# Patient Record
Sex: Male | Born: 1945 | Race: Black or African American | Hispanic: No | Smoking: Former smoker
Health system: Southern US, Community
[De-identification: ages and names within clinical notes are randomized; demographics above are authoritative.]

## PROBLEM LIST (undated history)

## (undated) DIAGNOSIS — I10 Essential (primary) hypertension: Secondary | ICD-10-CM

## (undated) DIAGNOSIS — E785 Hyperlipidemia, unspecified: Secondary | ICD-10-CM

## (undated) DIAGNOSIS — G4730 Sleep apnea, unspecified: Secondary | ICD-10-CM

## (undated) DIAGNOSIS — M1990 Unspecified osteoarthritis, unspecified site: Secondary | ICD-10-CM

## (undated) DIAGNOSIS — K219 Gastro-esophageal reflux disease without esophagitis: Secondary | ICD-10-CM

## (undated) DIAGNOSIS — I219 Acute myocardial infarction, unspecified: Secondary | ICD-10-CM

## (undated) DIAGNOSIS — R7303 Prediabetes: Secondary | ICD-10-CM

## (undated) DIAGNOSIS — Z9889 Other specified postprocedural states: Secondary | ICD-10-CM

## (undated) HISTORY — DX: Essential (primary) hypertension: I10

## (undated) HISTORY — PX: HERNIA REPAIR: SHX51

## (undated) HISTORY — DX: Prediabetes: R73.03

## (undated) HISTORY — DX: Hyperlipidemia, unspecified: E78.5

## (undated) HISTORY — DX: Unspecified osteoarthritis, unspecified site: M19.90

## (undated) HISTORY — PX: OTHER SURGICAL HISTORY: SHX169

---
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
## 2005-06-08 ENCOUNTER — Other Ambulatory Visit: Payer: Self-pay

## 2005-06-08 ENCOUNTER — Inpatient Hospital Stay: Payer: Self-pay | Admitting: Internal Medicine

## 2007-12-31 ENCOUNTER — Ambulatory Visit: Payer: Self-pay | Admitting: Internal Medicine

## 2008-02-02 ENCOUNTER — Ambulatory Visit: Payer: Self-pay | Admitting: Internal Medicine

## 2010-02-13 HISTORY — PX: COLONOSCOPY: SHX174

## 2010-11-01 ENCOUNTER — Ambulatory Visit: Payer: Self-pay | Admitting: Emergency Medicine

## 2012-06-12 ENCOUNTER — Emergency Department: Payer: Self-pay

## 2012-06-12 LAB — BASIC METABOLIC PANEL
Anion Gap: 5 — ABNORMAL LOW (ref 7–16)
BUN: 15 mg/dL (ref 7–18)
Co2: 27 mmol/L (ref 21–32)
Creatinine: 0.84 mg/dL (ref 0.60–1.30)
EGFR (African American): >60
EGFR (Non-African Amer.): >60
Glucose: 103 mg/dL — ABNORMAL HIGH (ref 65–99)
Osmolality: 280 (ref 275–301)
Potassium: 4 mmol/L (ref 3.5–5.1)

## 2012-06-12 LAB — CBC
MCH: 32.2 pg (ref 26.0–34.0)
RBC: 4.85 10*6/uL (ref 4.40–5.90)
RDW: 14 % (ref 11.5–14.5)

## 2012-06-12 LAB — CK TOTAL AND CKMB (NOT AT ARMC)
CK, Total: 142 U/L (ref 35–232)
CK-MB: 2.6 ng/mL (ref 0.5–3.6)

## 2013-07-22 DIAGNOSIS — I10 Essential (primary) hypertension: Secondary | ICD-10-CM | POA: Insufficient documentation

## 2013-07-22 DIAGNOSIS — E119 Type 2 diabetes mellitus without complications: Secondary | ICD-10-CM | POA: Insufficient documentation

## 2014-08-12 ENCOUNTER — Encounter: Payer: Self-pay | Admitting: Family Medicine

## 2014-08-12 ENCOUNTER — Ambulatory Visit (INDEPENDENT_AMBULATORY_CARE_PROVIDER_SITE_OTHER): Payer: Medicare Other | Admitting: Family Medicine

## 2014-08-12 VITALS — BP 109/66 | HR 76 | Temp 98.1°F | Ht 74.7 in | Wt 298.0 lb

## 2014-08-12 DIAGNOSIS — M2550 Pain in unspecified joint: Secondary | ICD-10-CM

## 2014-08-12 DIAGNOSIS — Z0000 Encounter for general adult medical examination without abnormal findings: Secondary | ICD-10-CM

## 2014-08-12 DIAGNOSIS — R7309 Other abnormal glucose: Secondary | ICD-10-CM | POA: Insufficient documentation

## 2014-08-12 DIAGNOSIS — N400 Enlarged prostate without lower urinary tract symptoms: Secondary | ICD-10-CM | POA: Diagnosis not present

## 2014-08-12 DIAGNOSIS — R7303 Prediabetes: Secondary | ICD-10-CM

## 2014-08-12 LAB — URINALYSIS, ROUTINE W REFLEX MICROSCOPIC
Bilirubin, UA: NEGATIVE
GLUCOSE, UA: NEGATIVE
Ketones, UA: NEGATIVE
Leukocytes, UA: NEGATIVE
Nitrite, UA: NEGATIVE
PROTEIN UA: NEGATIVE
RBC UA: NEGATIVE
Specific Gravity, UA: 1.02 (ref 1.005–1.030)
Urobilinogen, Ur: 0.2 mg/dL (ref 0.2–1.0)
pH, UA: 7 (ref 5.0–7.5)

## 2014-08-12 LAB — BAYER DCA HB A1C WAIVED: HB A1C: 6.3 % (ref <7.0)

## 2014-08-12 NOTE — Progress Notes (Signed)
BP 109/66 mmHg  Pulse 76  Temp(Src) 98.1 F (36.7 C)  Ht 6' 2.7" (1.897 m)  Wt 298 lb (135.172 kg)  BMI 37.56 kg/m2  SpO2 97%   Subjective:    [name], male    DOB: 1945-07-14, 69 y.o.   MRN: [id number]  HPI: Derek Wright is a 69 y.o. male  Chief Complaint  Patient presents with  . Annual Exam  has poly arthritis Ankles hips knees back shoulders Mostly ankles and hips occ hands Stiff sore BC helps a lot, aleve helps most Ongoing for years Some ankle swelling  Relevant past medical, surgical, family and social history reviewed and updated as indicated. Interim medical history since our last visit reviewed. Allergies and medications reviewed and updated.  Review of Systems  Constitutional: Negative.   HENT: Negative.   Eyes: Negative.   Respiratory: Negative.   Cardiovascular: Negative.   Endocrine: Negative.   Musculoskeletal: Positive for myalgias, back pain, joint swelling and arthralgias.  Skin: Negative.   Allergic/Immunologic: Negative.   Neurological: Negative.   Hematological: Negative.   Psychiatric/Behavioral: Negative.     Per HPI unless specifically indicated above     Objective:    BP 109/66 mmHg  Pulse 76  Temp(Src) 98.1 F (36.7 C)  Ht 6' 2.7" (1.897 m)  Wt 298 lb (135.172 kg)  BMI 37.56 kg/m2  SpO2 97%  Wt Readings from Last 3 Encounters:  08/12/14 298 lb (135.172 kg)  07/16/13 297 lb (134.718 kg)    Physical Exam  Constitutional: He is oriented to person, place, and time. He appears well-developed and well-nourished.  HENT:  Head: Normocephalic and atraumatic.  Right Ear: External ear normal.  Left Ear: External ear normal.  Eyes: Conjunctivae and EOM are normal. Pupils are equal, round, and reactive to light.  Neck: Normal range of motion. Neck supple.  Cardiovascular: Normal rate, regular rhythm, normal heart sounds and intact distal pulses.   Pulmonary/Chest: Effort normal and breath sounds normal.   Abdominal: Soft. Bowel sounds are normal. There is no splenomegaly or hepatomegaly.  Genitourinary: Rectum normal, prostate normal and penis normal.  Musculoskeletal: Normal range of motion. He exhibits no edema.       Right ankle: Tenderness.       Left ankle: Tenderness.  Neurological: He is alert and oriented to person, place, and time. He has normal reflexes.  Skin: No rash noted. No erythema.  Psychiatric: He has a normal mood and affect. His behavior is normal. Judgment and thought content normal.    Results for orders placed or performed in visit on 94/49/67  Basic metabolic panel  Result Value Ref Range   Glucose 103 (H) 65-99 mg/dL   BUN 15 7-18 mg/dL   Creatinine 0.84 0.60-1.30 mg/dL   Sodium 140 136-145 mmol/L   Potassium 4.0 3.5-5.1 mmol/L   Chloride 108 (H) 98-107 mmol/L   Co2 27 21-32 mmol/L   Calcium, Total 8.6 8.5-10.1 mg/dL   Osmolality 280 275-301   Anion Gap 5 (L) 7-16   EGFR (African American) >60    EGFR (Non-African Amer.) >60   CK total and CKMB (cardiac)  Result Value Ref Range   CK, Total 142 35-232 Unit/L   CK-MB 2.6 0.5-3.6 ng/mL  CBC  Result Value Ref Range   WBC 4.4 3.8-10.6 x10 3/mm 3   RBC 4.85 4.40-5.90 x10 6/mm 3   HGB 15.6 13.0-18.0 g/dL   HCT 45.2 40.0-52.0 %   MCV 93 80-100 fL  MCH 32.2 26.0-34.0 pg   MCHC 34.6 32.0-36.0 g/dL   RDW 14.0 11.5-14.5 %   Platelet 214 150-440 x10 3/mm 3  Troponin I  Result Value Ref Range   Troponin-I 0.02 ng/mL  Troponin I  Result Value Ref Range   Troponin-I < 0.02 ng/mL      Assessment & Plan:   Problem List Items Addressed This Visit      Endocrine   Pre-diabetes   Relevant Orders   Bayer DCA Hb A1c Waived   TSH   Lipid panel     Genitourinary   BPH without obstruction/lower urinary tract symptoms   Relevant Orders   TSH   PSA   Lipid panel     Other   Arthralgia - Primary    Discussed care and tx shoes etc      Relevant Orders   TSH    Other Visit Diagnoses    PE  (physical exam), annual        Relevant Orders    Comprehensive metabolic panel    CBC with Differential/Platelet    Urinalysis, Routine w reflex microscopic (not at [facility])    TSH    Lipid panel        Follow up plan: Return in about 1 year (around 08/12/2015) for Physical Exam and a1c.       

## 2014-08-12 NOTE — Assessment & Plan Note (Signed)
Discussed care and tx shoes etc

## 2014-08-13 LAB — COMPREHENSIVE METABOLIC PANEL
ALT: 15 IU/L (ref 0–44)
AST: 16 IU/L (ref 0–40)
Albumin/Globulin Ratio: 1.7 (ref 1.1–2.5)
Albumin: 4 g/dL (ref 3.6–4.8)
Alkaline Phosphatase: 56 IU/L (ref 39–117)
BILIRUBIN TOTAL: 0.4 mg/dL (ref 0.0–1.2)
BUN/Creatinine Ratio: 13 (ref 10–22)
BUN: 13 mg/dL (ref 8–27)
CHLORIDE: 101 mmol/L (ref 97–108)
CO2: 26 mmol/L (ref 18–29)
Calcium: 9.2 mg/dL (ref 8.6–10.2)
Creatinine, Ser: 0.99 mg/dL (ref 0.76–1.27)
GFR calc non Af Amer: 78 mL/min/{1.73_m2} (ref >59)
GFR, EST AFRICAN AMERICAN: 91 mL/min/{1.73_m2} (ref >59)
Globulin, Total: 2.4 g/dL (ref 1.5–4.5)
Glucose: 76 mg/dL (ref 65–99)
Potassium: 4.3 mmol/L (ref 3.5–5.2)
SODIUM: 140 mmol/L (ref 134–144)
Total Protein: 6.4 g/dL (ref 6.0–8.5)

## 2014-08-13 LAB — CBC WITH DIFFERENTIAL/PLATELET
Basophils Absolute: 0 10*3/uL (ref 0.0–0.2)
Basos: 0 %
EOS (ABSOLUTE): 0.1 10*3/uL (ref 0.0–0.4)
Eos: 3 %
Hematocrit: 43.6 % (ref 37.5–51.0)
Hemoglobin: 14.5 g/dL (ref 12.6–17.7)
IMMATURE GRANS (ABS): 0 10*3/uL (ref 0.0–0.1)
IMMATURE GRANULOCYTES: 0 %
Lymphocytes Absolute: 1.8 10*3/uL (ref 0.7–3.1)
Lymphs: 39 %
MCH: 31.3 pg (ref 26.6–33.0)
MCHC: 33.3 g/dL (ref 31.5–35.7)
MCV: 94 fL (ref 79–97)
MONOCYTES: 12 %
Monocytes Absolute: 0.6 10*3/uL (ref 0.1–0.9)
NEUTROS ABS: 2.1 10*3/uL (ref 1.4–7.0)
NEUTROS PCT: 46 %
Platelets: 214 10*3/uL (ref 150–379)
RBC: 4.63 x10E6/uL (ref 4.14–5.80)
RDW: 14.1 % (ref 12.3–15.4)
WBC: 4.7 10*3/uL (ref 3.4–10.8)

## 2014-08-13 LAB — LIPID PANEL
CHOL/HDL RATIO: 3.3 ratio (ref 0.0–5.0)
Cholesterol, Total: 171 mg/dL (ref 100–199)
HDL: 52 mg/dL (ref >39)
LDL Calculated: 87 mg/dL (ref 0–99)
Triglycerides: 161 mg/dL — ABNORMAL HIGH (ref 0–149)
VLDL Cholesterol Cal: 32 mg/dL (ref 5–40)

## 2014-08-13 LAB — TSH: TSH: 1.46 u[IU]/mL (ref 0.450–4.500)

## 2014-08-13 LAB — PSA: Prostate Specific Ag, Serum: 1.7 ng/mL (ref 0.0–4.0)

## 2015-01-21 ENCOUNTER — Encounter: Payer: Self-pay | Admitting: Family Medicine

## 2015-07-14 ENCOUNTER — Ambulatory Visit (INDEPENDENT_AMBULATORY_CARE_PROVIDER_SITE_OTHER): Payer: Medicare Other | Admitting: Family Medicine

## 2015-07-14 ENCOUNTER — Encounter: Payer: Self-pay | Admitting: Family Medicine

## 2015-07-14 VITALS — BP 117/76 | HR 83 | Temp 97.9°F | Ht 72.2 in | Wt 268.0 lb

## 2015-07-14 DIAGNOSIS — K4090 Unilateral inguinal hernia, without obstruction or gangrene, not specified as recurrent: Secondary | ICD-10-CM | POA: Insufficient documentation

## 2015-07-14 NOTE — Assessment & Plan Note (Signed)
Discussed care and tx will obs for now

## 2015-07-14 NOTE — Progress Notes (Signed)
BP 117/76 mmHg  Pulse 83  Temp(Src) 97.9 F (36.6 C)  Ht 6' 0.2" (1.834 m)  Wt 268 lb (121.564 kg)  BMI 36.14 kg/m2  SpO2 97%   Subjective:    [name], male    DOB: [birth year], 70 y.o.   MRN: [id number]  HPI: Derek Wright is a 70 y.o. male  Chief Complaint  Patient presents with  . Hernia  Patient with left groin area bulging concerned to hernia hasn't done any really heavy lifting but does a lot of walking movement and light lifting. No real pain in this area no blood in stool or urine no change in bowels or urinary habits  Relevant past medical, surgical, family and social history reviewed and updated as indicated. Interim medical history since our last visit reviewed. Allergies and medications reviewed and updated.  Review of Systems  Per HPI unless specifically indicated above     Objective:    BP 117/76 mmHg  Pulse 83  Temp(Src) 97.9 F (36.6 C)  Ht 6' 0.2" (1.834 m)  Wt 268 lb (121.564 kg)  BMI 36.14 kg/m2  SpO2 97%  Wt Readings from Last 3 Encounters:  07/14/15 268 lb (121.564 kg)  08/12/14 298 lb (135.172 kg)  07/16/13 297 lb (134.718 kg)    Physical Exam  Constitutional: He is oriented to person, place, and time. He appears well-developed and well-nourished. No distress.  HENT:  Head: Normocephalic and atraumatic.  Right Ear: Hearing normal.  Left Ear: Hearing normal.  Nose: Nose normal.  Eyes: Conjunctivae and lids are normal. Right eye exhibits no discharge. Left eye exhibits no discharge. No scleral icterus.  Pulmonary/Chest: Effort normal. No respiratory distress.  Genitourinary:  Left pelvic area hernia  Musculoskeletal: Normal range of motion.  Neurological: He is alert and oriented to person, place, and time.  Skin: Skin is intact. No rash noted.  Psychiatric: He has a normal mood and affect. His speech is normal and behavior is normal. Judgment and thought content normal. Cognition and memory are normal.    Results for  orders placed or performed in visit on 08/12/14  Bayer DCA Hb A1c Waived  Result Value Ref Range   Bayer DCA Hb A1c Waived 6.3 <7.0 %  Comprehensive metabolic panel  Result Value Ref Range   Glucose 76 65 - 99 mg/dL   BUN 13 8 - 27 mg/dL   Creatinine, Ser 0.99 0.76 - 1.27 mg/dL   GFR calc non Af Amer 78 >59 mL/min/1.73   GFR calc Af Amer 91 >59 mL/min/1.73   BUN/Creatinine Ratio 13 10 - 22   Sodium 140 134 - 144 mmol/L   Potassium 4.3 3.5 - 5.2 mmol/L   Chloride 101 97 - 108 mmol/L   CO2 26 18 - 29 mmol/L   Calcium 9.2 8.6 - 10.2 mg/dL   Total Protein 6.4 6.0 - 8.5 g/dL   Albumin 4.0 3.6 - 4.8 g/dL   Globulin, Total 2.4 1.5 - 4.5 g/dL   Albumin/Globulin Ratio 1.7 1.1 - 2.5   Bilirubin Total 0.4 0.0 - 1.2 mg/dL   Alkaline Phosphatase 56 39 - 117 IU/L   AST 16 0 - 40 IU/L   ALT 15 0 - 44 IU/L  CBC with Differential/Platelet  Result Value Ref Range   WBC 4.7 3.4 - 10.8 x10E3/uL   RBC 4.63 4.14 - 5.80 x10E6/uL   Hemoglobin 14.5 12.6 - 17.7 g/dL   Hematocrit 43.6 37.5 - 51.0 %  MCV 94 79 - 97 fL   MCH 31.3 26.6 - 33.0 pg   MCHC 33.3 31.5 - 35.7 g/dL   RDW 14.1 12.3 - 15.4 %   Platelets 214 150 - 379 x10E3/uL   Neutrophils 46 %   Lymphs 39 %   Monocytes 12 %   Eos 3 %   Basos 0 %   Neutrophils Absolute 2.1 1.4 - 7.0 x10E3/uL   Lymphocytes Absolute 1.8 0.7 - 3.1 x10E3/uL   Monocytes Absolute 0.6 0.1 - 0.9 x10E3/uL   EOS (ABSOLUTE) 0.1 0.0 - 0.4 x10E3/uL   Basophils Absolute 0.0 0.0 - 0.2 x10E3/uL   Immature Granulocytes 0 %   Immature Grans (Abs) 0.0 0.0 - 0.1 x10E3/uL  Urinalysis, Routine w reflex microscopic (not at [facility])  Result Value Ref Range   Specific Gravity, UA 1.020 1.005 - 1.030   pH, UA 7.0 5.0 - 7.5   Color, UA Yellow Yellow   Appearance Ur Clear Clear   Leukocytes, UA Negative Negative   Protein, UA Negative Negative/Trace   Glucose, UA Negative Negative   Ketones, UA Negative Negative   RBC, UA Negative Negative   Bilirubin, UA Negative Negative    Urobilinogen, Ur 0.2 0.2 - 1.0 mg/dL   Nitrite, UA Negative Negative  TSH  Result Value Ref Range   TSH 1.460 0.450 - 4.500 uIU/mL  PSA  Result Value Ref Range   Prostate Specific Ag, Serum 1.7 0.0 - 4.0 ng/mL  Lipid panel  Result Value Ref Range   Cholesterol, Total 171 100 - 199 mg/dL   Triglycerides 161 (H) 0 - 149 mg/dL   HDL 52 >39 mg/dL   VLDL Cholesterol Cal 32 5 - 40 mg/dL   LDL Calculated 87 0 - 99 mg/dL   Chol/HDL Ratio 3.3 0.0 - 5.0 ratio units      Assessment & Plan:   Problem List Items Addressed This Visit      Other   Hernia, inguinal, left - Primary    Discussed care and tx will obs for now          Follow up plan: Return for Physical Exam.

## 2015-08-13 ENCOUNTER — Telehealth: Payer: Self-pay | Admitting: Family Medicine

## 2015-08-13 ENCOUNTER — Other Ambulatory Visit: Payer: Self-pay | Admitting: Family Medicine

## 2015-08-13 DIAGNOSIS — K4090 Unilateral inguinal hernia, without obstruction or gangrene, not specified as recurrent: Secondary | ICD-10-CM

## 2015-08-13 NOTE — Telephone Encounter (Signed)
Pt came in stated he has a hernia would like surgery to fix this. Pt would like to be referred for this. Please call pt for further details. Thanks.

## 2015-08-13 NOTE — Telephone Encounter (Signed)
Referral placed for gen surgery

## 2015-08-13 NOTE — Telephone Encounter (Signed)
Patient was seen on 07/14/15 for this hernia, can you put in a referral please

## 2015-08-18 ENCOUNTER — Encounter: Payer: Self-pay | Admitting: General Surgery

## 2015-08-23 ENCOUNTER — Encounter: Payer: Medicare Other | Admitting: Family Medicine

## 2015-09-01 ENCOUNTER — Ambulatory Visit: Payer: Self-pay | Admitting: General Surgery

## 2015-09-13 ENCOUNTER — Ambulatory Visit (INDEPENDENT_AMBULATORY_CARE_PROVIDER_SITE_OTHER): Payer: Medicare Other | Admitting: Family Medicine

## 2015-09-13 ENCOUNTER — Ambulatory Visit (INDEPENDENT_AMBULATORY_CARE_PROVIDER_SITE_OTHER): Payer: Medicare Other | Admitting: General Surgery

## 2015-09-13 ENCOUNTER — Encounter: Payer: Self-pay | Admitting: General Surgery

## 2015-09-13 ENCOUNTER — Encounter: Payer: Self-pay | Admitting: Family Medicine

## 2015-09-13 VITALS — BP 130/72 | HR 68 | Resp 14 | Ht 77.0 in | Wt 278.0 lb

## 2015-09-13 VITALS — BP 104/65 | HR 68 | Temp 97.2°F | Ht 74.0 in | Wt 276.0 lb

## 2015-09-13 DIAGNOSIS — K4090 Unilateral inguinal hernia, without obstruction or gangrene, not specified as recurrent: Secondary | ICD-10-CM | POA: Diagnosis not present

## 2015-09-13 DIAGNOSIS — Z23 Encounter for immunization: Secondary | ICD-10-CM

## 2015-09-13 DIAGNOSIS — M2550 Pain in unspecified joint: Secondary | ICD-10-CM

## 2015-09-13 DIAGNOSIS — R609 Edema, unspecified: Secondary | ICD-10-CM | POA: Diagnosis not present

## 2015-09-13 DIAGNOSIS — M25473 Effusion, unspecified ankle: Secondary | ICD-10-CM | POA: Insufficient documentation

## 2015-09-13 DIAGNOSIS — R7303 Prediabetes: Secondary | ICD-10-CM | POA: Diagnosis not present

## 2015-09-13 DIAGNOSIS — N400 Enlarged prostate without lower urinary tract symptoms: Secondary | ICD-10-CM

## 2015-09-13 DIAGNOSIS — Z0000 Encounter for general adult medical examination without abnormal findings: Secondary | ICD-10-CM

## 2015-09-13 LAB — URINALYSIS, ROUTINE W REFLEX MICROSCOPIC
Bilirubin, UA: NEGATIVE
Glucose, UA: NEGATIVE
KETONES UA: NEGATIVE
LEUKOCYTES UA: NEGATIVE
Nitrite, UA: NEGATIVE
PROTEIN UA: NEGATIVE
RBC UA: NEGATIVE
Specific Gravity, UA: 1.02 (ref 1.005–1.030)
Urobilinogen, Ur: 1 mg/dL (ref 0.2–1.0)
pH, UA: 6.5 (ref 5.0–7.5)

## 2015-09-13 NOTE — Assessment & Plan Note (Signed)
For edema discussed elevation modest salt and compression and will observe

## 2015-09-13 NOTE — Assessment & Plan Note (Signed)
Bilateral knee stiffness but no real complaints or other issues

## 2015-09-13 NOTE — Assessment & Plan Note (Signed)
Stable without meds for now

## 2015-09-13 NOTE — Assessment & Plan Note (Signed)
Will check hemoglobin A1c 

## 2015-09-13 NOTE — Patient Instructions (Signed)
Pneumococcal Conjugate Vaccine (PCV13)   1. Why get vaccinated?  Vaccination can protect both children and adults from pneumococcal disease.  Pneumococcal disease is caused by bacteria that can spread from person to person through close contact. It can cause ear infections, and it can also lead to more serious infections of the:  · Lungs (pneumonia),  · Blood (bacteremia), and  · Covering of the brain and spinal cord (meningitis).  Pneumococcal pneumonia is most common among adults. Pneumococcal meningitis can cause deafness and brain damage, and it kills about 1 child in 10 who get it.  Anyone can get pneumococcal disease, but children under 2 years of age and adults 65 years and older, people with certain medical conditions, and cigarette smokers are at the highest risk.  Before there was a vaccine, the [place] saw:  · more than 700 cases of meningitis,  · about 13,000 blood infections,  · about 5 million ear infections, and  · about 200 deaths  in children under 5 each year from pneumococcal disease. Since vaccine became available, severe pneumococcal disease in these children has fallen by 88%.  About 18,000 older adults die of pneumococcal disease each year in the [place].  Treatment of pneumococcal infections with penicillin and other drugs is not as effective as it used to be, because some strains of the disease have become resistant to these drugs. This makes prevention of the disease, through vaccination, even more important.  2. PCV13 vaccine  Pneumococcal conjugate vaccine (called PCV13) protects against 13 types of pneumococcal bacteria.  PCV13 is routinely given to children at 2, 4, 6, and 12-15 months of age. It is also recommended for children and adults 2 to 64 years of age with certain health conditions, and for all adults 65 years of age and older. Your doctor can give you details.  3. Some people should not get this vaccine  Anyone who has ever had a life-threatening allergic reaction  to a dose of this vaccine, to an earlier pneumococcal vaccine called PCV7, or to any vaccine containing diphtheria toxoid (for example, DTaP), should not get PCV13.  Anyone with a severe allergy to any component of PCV13 should not get the vaccine. Tell your doctor if the person being vaccinated has any severe allergies.  If the person scheduled for vaccination is not feeling well, your healthcare provider might decide to reschedule the shot on another day.  4. Risks of a vaccine reaction  With any medicine, including vaccines, there is a chance of reactions. These are usually mild and go away on their own, but serious reactions are also possible.  Problems reported following PCV13 varied by age and dose in the series. The most common problems reported among children were:  · About half became drowsy after the shot, had a temporary loss of appetite, or had redness or tenderness where the shot was given.  · About 1 out of 3 had swelling where the shot was given.  · About 1 out of 3 had a mild fever, and about 1 in 20 had a fever over 102.2°F.  · Up to about 8 out of 10 became fussy or irritable.  Adults have reported pain, redness, and swelling where the shot was given; also mild fever, fatigue, headache, chills, or muscle pain.  Young children who get PCV13 along with inactivated flu vaccine at the same time may be at increased risk for seizures caused by fever. Ask your doctor for more information.  Problems that   could happen after any vaccine:  · People sometimes faint after a medical procedure, including vaccination. Sitting or lying down for about 15 minutes can help prevent fainting, and injuries caused by a fall. Tell your doctor if you feel dizzy, or have vision changes or ringing in the ears.  · Some older children and adults get severe pain in the shoulder and have difficulty moving the arm where a shot was given. This happens very rarely.  · Any medication can cause a severe allergic reaction. Such  reactions from a vaccine are very rare, estimated at about 1 in a million doses, and would happen within a few minutes to a few hours after the vaccination.  As with any medicine, there is a very small chance of a vaccine causing a serious injury or death.  The safety of vaccines is always being monitored. For more information, visit: www.cdc.gov/vaccinesafety/  5. What if there is a serious reaction?  What should I look for?  · Look for anything that concerns you, such as signs of a severe allergic reaction, very high fever, or unusual behavior.  Signs of a severe allergic reaction can include hives, swelling of the face and throat, difficulty breathing, a fast heartbeat, dizziness, and weakness-usually within a few minutes to a few hours after the vaccination.  What should I do?  · If you think it is a severe allergic reaction or other emergency that can't wait, call 9-1-1 or get the person to the nearest hospital. Otherwise, call your doctor.  Reactions should be reported to the Vaccine Adverse Event Reporting System (VAERS). Your doctor should file this report, or you can do it yourself through the VAERS web site at www.vaers.hhs.gov, or by calling 1-800-822-7967.  VAERS does not give medical advice.  6. The National Vaccine Injury Compensation Program  The National Vaccine Injury Compensation Program (VICP) is a federal program that was created to compensate people who may have been injured by certain vaccines.  Persons who believe they may have been injured by a vaccine can learn about the program and about filing a claim by calling 1-800-338-2382 or visiting the VICP website at www.hrsa.gov/vaccinecompensation. There is a time limit to file a claim for compensation.  7. How can I learn more?  · Ask your healthcare provider. He or she can give you the vaccine package insert or suggest other sources of information.  · Call your local or state health department.  · Contact the Centers for Disease Control and  Prevention (CDC):    Call 1-800-232-4636 (1-800-CDC-INFO) or    Visit CDC's website at www.cdc.gov/vaccines  Vaccine Information Statement  PCV13 Vaccine (12/18/2013)     This information is not intended to replace advice given to you by your health care provider. Make sure you discuss any questions you have with your health care provider.     Document Released: 11/27/2005 Document Revised: 02/20/2014 Document Reviewed: 12/25/2013  Elsevier Interactive Patient Education ©2016 [employer]

## 2015-09-13 NOTE — Progress Notes (Signed)
BP 104/65 (BP Location: Left Arm, Patient Position: Sitting, Cuff Size: Normal)   Pulse 68   Temp 97.2 F (36.2 C)   Ht _0  (1.88 m)   Wt 276 lb (125.2 kg)   SpO2 99%   BMI 35.44 kg/m    Subjective:    [name], male    DOB: 08-05-1945, 70 y.o.   MRN: [id number]  HPI: Derek Wright is a 70 y.o. 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 On no nocturia no PND no orthopnea   Relevant past medical, surgical, family and social history reviewed and updated as indicated. Interim medical history since our last visit reviewed. Allergies and medications reviewed and updated.  Review of Systems  Constitutional: Negative.   HENT: Negative.   Eyes: Negative.   Respiratory: Negative.   Cardiovascular: Negative.   Gastrointestinal: Negative.   Endocrine: Negative.   Genitourinary: Negative.   Musculoskeletal: Negative.   Skin: Negative.   Allergic/Immunologic: Negative.   Neurological: Negative.   Hematological: Negative.   Psychiatric/Behavioral: Negative.     Per HPI unless specifically indicated above     Objective:    BP 104/65 (BP Location: Left Arm, Patient Position: Sitting, Cuff Size: Normal)   Pulse 68   Temp 97.2 F (36.2 C)   Ht _1  (1.88 m)   Wt 276 lb (125.2 kg)   SpO2 99%   BMI 35.44 kg/m   Wt Readings from Last 3 Encounters:  09/13/15 276 lb (125.2 kg)  09/13/15 278 lb (126.1 kg)  07/14/15 268 lb (121.6 kg)    Physical Exam  Constitutional: He is oriented to person, place, and time. He appears well-developed and well-nourished.  HENT:  Head: Normocephalic and atraumatic.  Right Ear: External ear normal.    Left Ear: External ear normal.  Eyes: Conjunctivae and EOM are normal. Pupils are equal, round, and reactive to light.  Neck: Normal range of motion. Neck supple.  Cardiovascular: Normal rate, regular rhythm, normal heart sounds and intact distal pulses.   Pulmonary/Chest: Effort normal and breath sounds normal.  Abdominal: Soft. Bowel sounds are normal. There is no splenomegaly or hepatomegaly.  Genitourinary: Rectum normal and prostate normal.  Genitourinary Comments: Hernia checked by surgery  Musculoskeletal: Normal range of motion.  Neurological: He is alert and oriented to person, place, and time. He has normal reflexes.  Skin: No rash noted. No erythema.  Psychiatric: He has a normal mood and affect. His behavior is normal. Judgment and thought content normal.    Results for orders placed or performed in visit on 08/12/14  Bayer DCA Hb A1c Waived  Result Value Ref Range   Bayer DCA Hb A1c Waived 6.3 <7.0 %  Comprehensive metabolic panel  Result Value Ref Range   Glucose 76 65 - 99 mg/dL   BUN 13 8 - 27 mg/dL   Creatinine, Ser 0.99 0.76 - 1.27 mg/dL   GFR calc non Af Amer 78 >59 mL/min/1.73   GFR calc Af Amer 91 >59 mL/min/1.73   BUN/Creatinine Ratio 13 10 - 22   Sodium 140 134 - 144 mmol/L   Potassium 4.3  3.5 - 5.2 mmol/L   Chloride 101 97 - 108 mmol/L   CO2 26 18 - 29 mmol/L   Calcium 9.2 8.6 - 10.2 mg/dL   Total Protein 6.4 6.0 - 8.5 g/dL   Albumin 4.0 3.6 - 4.8 g/dL   Globulin, Total 2.4 1.5 - 4.5 g/dL   Albumin/Globulin Ratio 1.7 1.1 - 2.5   Bilirubin Total 0.4 0.0 - 1.2 mg/dL   Alkaline Phosphatase 56 39 - 117 IU/L   AST 16 0 - 40 IU/L   ALT 15 0 - 44 IU/L  CBC with Differential/Platelet  Result Value Ref Range   WBC 4.7 3.4 - 10.8 x10E3/uL   RBC 4.63 4.14 - 5.80 x10E6/uL   Hemoglobin 14.5 12.6 - 17.7 g/dL   Hematocrit 43.6 37.5 - 51.0 %   MCV 94 79 - 97 fL   MCH 31.3 26.6 - 33.0 pg   MCHC 33.3 31.5 - 35.7 g/dL   RDW 14.1 12.3 - 15.4 %   Platelets 214  150 - 379 x10E3/uL   Neutrophils 46 %   Lymphs 39 %   Monocytes 12 %   Eos 3 %   Basos 0 %   Neutrophils Absolute 2.1 1.4 - 7.0 x10E3/uL   Lymphocytes Absolute 1.8 0.7 - 3.1 x10E3/uL   Monocytes Absolute 0.6 0.1 - 0.9 x10E3/uL   EOS (ABSOLUTE) 0.1 0.0 - 0.4 x10E3/uL   Basophils Absolute 0.0 0.0 - 0.2 x10E3/uL   Immature Granulocytes 0 %   Immature Grans (Abs) 0.0 0.0 - 0.1 x10E3/uL  Urinalysis, Routine w reflex microscopic (not at [facility])  Result Value Ref Range   Specific Gravity, UA 1.020 1.005 - 1.030   pH, UA 7.0 5.0 - 7.5   Color, UA Yellow Yellow   Appearance Ur Clear Clear   Leukocytes, UA Negative Negative   Protein, UA Negative Negative/Trace   Glucose, UA Negative Negative   Ketones, UA Negative Negative   RBC, UA Negative Negative   Bilirubin, UA Negative Negative   Urobilinogen, Ur 0.2 0.2 - 1.0 mg/dL   Nitrite, UA Negative Negative  TSH  Result Value Ref Range   TSH 1.460 0.450 - 4.500 uIU/mL  PSA  Result Value Ref Range   Prostate Specific Ag, Serum 1.7 0.0 - 4.0 ng/mL  Lipid panel  Result Value Ref Range   Cholesterol, Total 171 100 - 199 mg/dL   Triglycerides 161 (H) 0 - 149 mg/dL   HDL 52 >39 mg/dL   VLDL Cholesterol Cal 32 5 - 40 mg/dL   LDL Calculated 87 0 - 99 mg/dL   Chol/HDL Ratio 3.3 0.0 - 5.0 ratio units      Assessment & Plan:   Problem List Items Addressed This Visit      Genitourinary   BPH without obstruction/lower urinary tract symptoms    Stable without meds for now        Other   Arthralgia    Bilateral knee stiffness but no real complaints or other issues      Pre-diabetes    Will check hemoglobin A1c      Relevant Orders   Hgb A1c w/o eAG   Ankle edema    For edema discussed elevation modest salt and compression and will observe       Other Visit Diagnoses    Annual physical exam    -  Primary   Relevant Orders   Urinalysis, Routine w reflex microscopic (not at [facility])   CBC with Differential/Platelet  Comprehensive metabolic panel   Lipid Panel w/o Chol/HDL Ratio   PSA   TSH   Healthcare maintenance       Relevant Orders   Hepatitis C Antibody   Need for pneumococcal vaccination       Relevant Orders   Pneumococcal conjugate vaccine 13-valent IM (Completed)       Follow up plan: Return in about 1 year (around 09/12/2016), or if symptoms worsen or fail to improve.

## 2015-09-13 NOTE — Patient Instructions (Addendum)

## 2015-09-13 NOTE — Progress Notes (Signed)
[name], male   DOB: [birth year], 70 y.o.   MRN: [id number]  Chief Complaint  Patient presents with  . Other    inguinal hernia    HPI Derek Wright is a 70 y.o. male here today for a evaluation of a left inguinal hernia. He states he noticed about three months ago. The area pops in and out, Some pain with with activities. Wife is present at visit.  The patient is not aware of any unusual activity that precipitated his awareness of the left groin bulge. He was not aware of any problem in the right groin.  No difficulty with urination, rare nocturia. New 2  No change in bowel habits. HPI  Past Medical History:  Diagnosis Date  . Arthritis   . Pre-diabetes     Past Surgical History:  Procedure Laterality Date  . COLONOSCOPY  0000000  . hair folicle removed    . SKIN GRAFT Right 1966   hand    Family History  Problem Relation Age of Onset  . Ovarian cancer Mother   . Heart disease Father   . Lymphoma Maternal Uncle     Social History Social History  Substance Use Topics  . Smoking status: Former Smoker    Quit date: 09/13/1980  . Smokeless tobacco: Never Used  . Alcohol use 0.0 oz/week     Comment: rare    No Known Allergies  No current outpatient prescriptions on file.   No current facility-administered medications for this visit.     Review of Systems Review of Systems  Constitutional: Negative.   Respiratory: Negative.   Cardiovascular: Negative.     Blood pressure 130/72, pulse 68, resp. rate 14, height 6\' 5"  (1.956 m), weight 278 lb (126.1 kg).  The patient's weight is down 20 pounds from June 2015, up 10 pounds from May 2017.  Physical Exam Physical Exam  Constitutional: He is oriented to person, place, and time. He appears well-developed and well-nourished.  Eyes: Conjunctivae are normal. No scleral icterus.  Neck: Neck supple.  Cardiovascular: Normal rate, regular rhythm and normal heart sounds.   Pulmonary/Chest: Effort normal and  breath sounds normal.  Abdominal: Soft. Normal appearance and bowel sounds are normal. There is no tenderness. A hernia is present. Hernia confirmed positive in the right inguinal area and confirmed positive in the left inguinal area.  Genitourinary:    Right testis shows no mass and no tenderness. Left testis shows no mass and no tenderness.  Lymphadenopathy:    He has no cervical adenopathy.  Neurological: He is alert and oriented to person, place, and time.  Skin: Skin is warm and dry.    Data Reviewed  PCP notes of today. Results for orders placed or performed in visit on 08/12/14  Bayer DCA Hb A1c Waived  Result Value Ref Range   Bayer DCA Hb A1c Waived 6.3 <7.0 %  Comprehensive metabolic panel  Result Value Ref Range   Glucose 76 65 - 99 mg/dL   BUN 13 8 - 27 mg/dL   Creatinine, Ser 0.99 0.76 - 1.27 mg/dL   GFR calc non Af Amer 78 >59 mL/min/1.73   GFR calc Af Amer 91 >59 mL/min/1.73   BUN/Creatinine Ratio 13 10 - 22   Sodium 140 134 - 144 mmol/L   Potassium 4.3 3.5 - 5.2 mmol/L   Chloride 101 97 - 108 mmol/L   CO2 26 18 - 29 mmol/L   Calcium 9.2 8.6 - 10.2 mg/dL  Total Protein 6.4 6.0 - 8.5 g/dL   Albumin 4.0 3.6 - 4.8 g/dL   Globulin, Total 2.4 1.5 - 4.5 g/dL   Albumin/Globulin Ratio 1.7 1.1 - 2.5   Bilirubin Total 0.4 0.0 - 1.2 mg/dL   Alkaline Phosphatase 56 39 - 117 IU/L   AST 16 0 - 40 IU/L   ALT 15 0 - 44 IU/L  CBC with Differential/Platelet  Result Value Ref Range   WBC 4.7 3.4 - 10.8 x10E3/uL   RBC 4.63 4.14 - 5.80 x10E6/uL   Hemoglobin 14.5 12.6 - 17.7 g/dL   Hematocrit 43.6 37.5 - 51.0 %   MCV 94 79 - 97 fL   MCH 31.3 26.6 - 33.0 pg   MCHC 33.3 31.5 - 35.7 g/dL   RDW 14.1 12.3 - 15.4 %   Platelets 214 150 - 379 x10E3/uL   Neutrophils 46 %   Lymphs 39 %   Monocytes 12 %   Eos 3 %   Basos 0 %   Neutrophils Absolute 2.1 1.4 - 7.0 x10E3/uL   Lymphocytes Absolute 1.8 0.7 - 3.1 x10E3/uL   Monocytes Absolute 0.6  0.1 - 0.9 x10E3/uL   EOS (ABSOLUTE) 0.1 0.0 - 0.4 x10E3/uL   Basophils Absolute 0.0 0.0 - 0.2 x10E3/uL   Immature Granulocytes 0 %   Immature Grans (Abs) 0.0 0.0 - 0.1 x10E3/uL  Urinalysis, Routine w reflex microscopic (not at [facility])  Result Value Ref Range   Specific Gravity, UA 1.020 1.005 - 1.030   pH, UA 7.0 5.0 - 7.5   Color, UA Yellow Yellow   Appearance Ur Clear Clear   Leukocytes, UA Negative Negative   Protein, UA Negative Negative/Trace   Glucose, UA Negative Negative   Ketones, UA Negative Negative   RBC, UA Negative Negative   Bilirubin, UA Negative Negative   Urobilinogen, Ur 0.2 0.2 - 1.0 mg/dL   Nitrite, UA Negative Negative  TSH  Result Value Ref Range   TSH 1.460 0.450 - 4.500 uIU/mL  PSA  Result Value Ref Range   Prostate Specific Ag, Serum 1.7 0.0 - 4.0 ng/mL  Lipid panel  Result Value Ref Range   Cholesterol, Total 171 100 - 199 mg/dL   Triglycerides 161 (H) 0 - 149 mg/dL   HDL 52 >39 mg/dL   VLDL Cholesterol Cal 32 5 - 40 mg/dL   LDL Calculated 87 0 - 99 mg/dL   Chol/HDL Ratio 3.3 0.0 - 5.0 ratio units        Assessment    Bilateral inguinal hernia.    Plan    The patient remains active and is noting some discomfort especially on the left side. Elective repair is reasonable. The role for prosthetic mesh was discussed.   Hernia precautions and incarceration were discussed with the patient. If they develop symptoms of an incarcerated hernia, they were encouraged to seek prompt medical attention.  I have recommended repair of the hernia using mesh on an outpatient basis in the near future. The risk of infection was reviewed. The role of prosthetic mesh to minimize the risk of recurrence was reviewed.  The patient is scheduled for surgery at [facility] on 09/29/15. He will pre admit by phone. The patient is aware of date and instructions.   This information has been scribed by Gaspar Cola CMA.   Derek Wright 09/14/2015,  5:52 AM

## 2015-09-14 ENCOUNTER — Encounter: Payer: Self-pay | Admitting: Family Medicine

## 2015-09-14 DIAGNOSIS — K469 Unspecified abdominal hernia without obstruction or gangrene: Secondary | ICD-10-CM | POA: Insufficient documentation

## 2015-09-14 DIAGNOSIS — K4090 Unilateral inguinal hernia, without obstruction or gangrene, not specified as recurrent: Secondary | ICD-10-CM | POA: Insufficient documentation

## 2015-09-14 DIAGNOSIS — K4020 Bilateral inguinal hernia, without obstruction or gangrene, not specified as recurrent: Secondary | ICD-10-CM | POA: Insufficient documentation

## 2015-09-14 LAB — COMPREHENSIVE METABOLIC PANEL
ALBUMIN: 4 g/dL (ref 3.6–4.8)
ALT: 11 IU/L (ref 0–44)
AST: 16 IU/L (ref 0–40)
Albumin/Globulin Ratio: 1.7 (ref 1.2–2.2)
Alkaline Phosphatase: 58 IU/L (ref 39–117)
BILIRUBIN TOTAL: 0.3 mg/dL (ref 0.0–1.2)
BUN / CREAT RATIO: 17 (ref 10–24)
BUN: 14 mg/dL (ref 8–27)
CHLORIDE: 102 mmol/L (ref 96–106)
CO2: 25 mmol/L (ref 18–29)
CREATININE: 0.83 mg/dL (ref 0.76–1.27)
Calcium: 9.1 mg/dL (ref 8.6–10.2)
GFR calc non Af Amer: 90 mL/min/{1.73_m2} (ref >59)
GFR, EST AFRICAN AMERICAN: 105 mL/min/{1.73_m2} (ref >59)
GLUCOSE: 98 mg/dL (ref 65–99)
Globulin, Total: 2.4 g/dL (ref 1.5–4.5)
Potassium: 4.3 mmol/L (ref 3.5–5.2)
Sodium: 140 mmol/L (ref 134–144)
TOTAL PROTEIN: 6.4 g/dL (ref 6.0–8.5)

## 2015-09-14 LAB — LIPID PANEL W/O CHOL/HDL RATIO
CHOLESTEROL TOTAL: 168 mg/dL (ref 100–199)
HDL: 55 mg/dL (ref >39)
LDL CALC: 85 mg/dL (ref 0–99)
Triglycerides: 142 mg/dL (ref 0–149)
VLDL CHOLESTEROL CAL: 28 mg/dL (ref 5–40)

## 2015-09-14 LAB — CBC WITH DIFFERENTIAL/PLATELET
BASOS ABS: 0 10*3/uL (ref 0.0–0.2)
Basos: 1 %
EOS (ABSOLUTE): 0.2 10*3/uL (ref 0.0–0.4)
Eos: 6 %
HEMOGLOBIN: 13.8 g/dL (ref 12.6–17.7)
Hematocrit: 42.5 % (ref 37.5–51.0)
IMMATURE GRANS (ABS): 0 10*3/uL (ref 0.0–0.1)
IMMATURE GRANULOCYTES: 0 %
LYMPHS: 52 %
Lymphocytes Absolute: 2 10*3/uL (ref 0.7–3.1)
MCH: 31.2 pg (ref 26.6–33.0)
MCHC: 32.5 g/dL (ref 31.5–35.7)
MCV: 96 fL (ref 79–97)
MONOCYTES: 13 %
Monocytes Absolute: 0.5 10*3/uL (ref 0.1–0.9)
NEUTROS PCT: 28 %
Neutrophils Absolute: 1.1 10*3/uL — ABNORMAL LOW (ref 1.4–7.0)
PLATELETS: 190 10*3/uL (ref 150–379)
RBC: 4.42 x10E6/uL (ref 4.14–5.80)
RDW: 14.2 % (ref 12.3–15.4)
WBC: 3.8 10*3/uL (ref 3.4–10.8)

## 2015-09-14 LAB — PSA: Prostate Specific Ag, Serum: 1.6 ng/mL (ref 0.0–4.0)

## 2015-09-14 LAB — TSH: TSH: 0.692 u[IU]/mL (ref 0.450–4.500)

## 2015-09-14 LAB — HEPATITIS C ANTIBODY: HEP C VIRUS AB: 0.1 {s_co_ratio} (ref 0.0–0.9)

## 2015-09-14 NOTE — H&P (Signed)
HPI Derek Wright is a 70 y.o. male here today for a evaluation of a left inguinal hernia. He states he noticed about three months ago. The area pops in and out, Some pain with with activities. Wife is present at visit.  The patient is not aware of any unusual activity that precipitated his awareness of the left groin bulge. He was not aware of any problem in the right groin.  No difficulty with urination, rare nocturia. New 2  No change in bowel habits. HPI      Past Medical History:  Diagnosis Date  . Arthritis   . Pre-diabetes          Past Surgical History:  Procedure Laterality Date  . COLONOSCOPY  0000000  . hair folicle removed    . SKIN GRAFT Right 1966   hand         Family History  Problem Relation Age of Onset  . Ovarian cancer Mother   . Heart disease Father   . Lymphoma Maternal Uncle     Social History        Social History  Substance Use Topics  . Smoking status: Former Smoker    Quit date: 09/13/1980  . Smokeless tobacco: Never Used  . Alcohol use 0.0 oz/week      Comment: rare    No Known Allergies  No current outpatient prescriptions on file.   No current facility-administered medications for this visit.     Review of Systems Review of Systems  Constitutional: Negative.   Respiratory: Negative.   Cardiovascular: Negative.     Blood pressure 130/72, pulse 68, resp. rate 14, height 6\' 5"  (1.956 m), weight 278 lb (126.1 kg).  The patient's weight is down 20 pounds from June 2015, up 10 pounds from May 2017.  Physical Exam Physical Exam  Constitutional: He is oriented to person, place, and time. He appears well-developed and well-nourished.  Eyes: Conjunctivae are normal. No scleral icterus.  Neck: Neck supple.  Cardiovascular: Normal rate, regular rhythm and normal heart sounds.   Pulmonary/Chest: Effort normal and breath sounds normal.  Abdominal: Soft. Normal appearance and bowel sounds are normal. There is  no tenderness. A hernia is present. Hernia confirmed positive in the right inguinal area and confirmed positive in the left inguinal area.  Genitourinary:    Right testis shows no mass and no tenderness. Left testis shows no mass and no tenderness.  Lymphadenopathy:    He has no cervical adenopathy.  Neurological: He is alert and oriented to person, place, and time.  Skin: Skin is warm and dry.    Data Reviewed  PCP notes of today.      Results for orders placed or performed in visit on 08/12/14  Bayer DCA Hb A1c Waived  Result Value Ref Range   Bayer DCA Hb A1c Waived 6.3 <7.0 %  Comprehensive metabolic panel  Result Value Ref Range   Glucose 76 65 - 99 mg/dL   BUN 13 8 - 27 mg/dL   Creatinine, Ser 0.99 0.76 - 1.27 mg/dL   GFR calc non Af Amer 78 >59 mL/min/1.73   GFR calc Af Amer 91 >59 mL/min/1.73   BUN/Creatinine Ratio 13 10 - 22   Sodium 140 134 - 144 mmol/L   Potassium 4.3 3.5 - 5.2 mmol/L   Chloride 101 97 - 108 mmol/L   CO2 26 18 - 29 mmol/L   Calcium 9.2 8.6 - 10.2 mg/dL   Total Protein 6.4 6.0 - 8.5  g/dL   Albumin 4.0 3.6 - 4.8 g/dL   Globulin, Total 2.4 1.5 - 4.5 g/dL   Albumin/Globulin Ratio 1.7 1.1 - 2.5   Bilirubin Total 0.4 0.0 - 1.2 mg/dL   Alkaline Phosphatase 56 39 - 117 IU/L   AST 16 0 - 40 IU/L   ALT 15 0 - 44 IU/L  CBC with Differential/Platelet  Result Value Ref Range   WBC 4.7 3.4 - 10.8 x10E3/uL   RBC 4.63 4.14 - 5.80 x10E6/uL   Hemoglobin 14.5 12.6 - 17.7 g/dL   Hematocrit 43.6 37.5 - 51.0 %   MCV 94 79 - 97 fL   MCH 31.3 26.6 - 33.0 pg   MCHC 33.3 31.5 - 35.7 g/dL   RDW 14.1 12.3 - 15.4 %   Platelets 214 150 - 379 x10E3/uL   Neutrophils 46 %   Lymphs 39 %   Monocytes 12 %   Eos 3 %   Basos 0 %   Neutrophils Absolute 2.1 1.4 - 7.0 x10E3/uL   Lymphocytes Absolute 1.8 0.7 - 3.1 x10E3/uL   Monocytes Absolute 0.6 0.1 - 0.9 x10E3/uL   EOS (ABSOLUTE) 0.1 0.0 - 0.4 x10E3/uL   Basophils Absolute 0.0  0.0 - 0.2 x10E3/uL   Immature Granulocytes 0 %   Immature Grans (Abs) 0.0 0.0 - 0.1 x10E3/uL  Urinalysis, Routine w reflex microscopic (not at [facility])  Result Value Ref Range   Specific Gravity, UA 1.020 1.005 - 1.030   pH, UA 7.0 5.0 - 7.5   Color, UA Yellow Yellow   Appearance Ur Clear Clear   Leukocytes, UA Negative Negative   Protein, UA Negative Negative/Trace   Glucose, UA Negative Negative   Ketones, UA Negative Negative   RBC, UA Negative Negative   Bilirubin, UA Negative Negative   Urobilinogen, Ur 0.2 0.2 - 1.0 mg/dL   Nitrite, UA Negative Negative  TSH  Result Value Ref Range   TSH 1.460 0.450 - 4.500 uIU/mL  PSA  Result Value Ref Range   Prostate Specific Ag, Serum 1.7 0.0 - 4.0 ng/mL  Lipid panel  Result Value Ref Range   Cholesterol, Total 171 100 - 199 mg/dL   Triglycerides 161 (H) 0 - 149 mg/dL   HDL 52 >39 mg/dL   VLDL Cholesterol Cal 32 5 - 40 mg/dL   LDL Calculated 87 0 - 99 mg/dL   Chol/HDL Ratio 3.3 0.0 - 5.0 ratio units      Assessment    Bilateral inguinal hernia.    Plan    The patient remains active and is noting some discomfort especially on the left side. Elective repair is reasonable. The role for prosthetic mesh was discussed.   Hernia precautions and incarceration were discussed with the patient. If they develop symptoms of an incarcerated hernia, they were encouraged to seek prompt medical attention.  I have recommended repair of the hernia using mesh on an outpatient basis in the near future. The risk of infection was reviewed. The role of prosthetic mesh to minimize the risk of recurrence was reviewed.  The patient is scheduled for surgery at [facility] on 09/29/15. He will pre admit by phone. The patient is aware of date and instructions.   This information has been scribed by Gaspar Cola CMA.   Robert Bellow 09/14/2015, 5:52 AM

## 2015-09-15 LAB — HGB A1C W/O EAG: HEMOGLOBIN A1C: 6.2 % — AB (ref 4.8–5.6)

## 2015-09-15 LAB — SPECIMEN STATUS REPORT

## 2015-09-21 ENCOUNTER — Encounter
Admission: RE | Admit: 2015-09-21 | Discharge: 2015-09-21 | Disposition: A | Discharge status: Home / Self Care | Payer: Medicare Other | Source: Ambulatory Visit | Attending: General Surgery | Admitting: General Surgery

## 2015-09-21 HISTORY — DX: Sleep apnea, unspecified: G47.30

## 2015-09-21 HISTORY — DX: Gastro-esophageal reflux disease without esophagitis: K21.9

## 2015-09-21 NOTE — Pre-Procedure Instructions (Signed)
Derek Gibbon, MD - 08/19/2015 10:30 AM EDT Formatting of this note may be different from the original. Established Patient Visit   Chief Complaint: Chief Complaint  Patient presents with  . Follow-up  1 YEAR  Date of Service: 08/19/2015 Date of Birth: 05-19-1945 PCP: Derek Maple, MD  History of Present Illness: Derek Wright is a 70 y.o.male patient who presents for preoperative clearance for upcoming hernia surgery. Patient has a history of obesity hypertension diabetes possible obstructive sleep apnea has complains of dyspnea on exertion leg edema found to have a murmur on exam now needs cardiac clearance for surgery. Patient was working part-time in [employer] he did a lot of walking noticed he had a hernia he is worried that he started with the work he was doing there walking and lifting. Patient was seen by his primary physician he lost about 20 pounds with his activity but he recently quit working he complains of leg edema and cramps as well as his recently diagnosed hernia now here for preoperative evaluation he had pretesting done August 02, 2015 but was referred to cardiology for cardiac clearance  Past Medical and Surgical History  Past Medical History Past Medical History:  Diagnosis Date  . Diabetes mellitus type 2, uncomplicated (CMS-HCC)  diet controlled  . Hypertension   Past Surgical History He has a past surgical history that includes skin draft (Right).   Medications and Allergies  Current Medications  No current outpatient prescriptions on file.   No current facility-administered medications for this visit.   Allergies: Review of patient's allergies indicates no known allergies.  Social and Family History  Social History reports that he has quit smoking. He has never used smokeless tobacco. Drug use questions deferred to the physician. He reports that he does not drink alcohol.  Family History Family History  Problem Relation Age of Onset  . No Known  Problems Mother  . Heart disease Father   Review of Systems   Review of Systems: The patient denies chest pain, shortness of breath, orthopnea, paroxysmal nocturnal dyspnea, pedal edema, palpitations, heart racing, presyncope, syncope. Review of 12 Systems is negative except as described above.  Physical Examination   Vitals:BP 120/78  Pulse 70  Ht 195.6 cm (6\' 5" )  Wt (!) 127.9 kg (282 lb)  SpO2 99%  BMI 33.44 kg/m2 Ht:195.6 cm (6\' 5" ) Wt:(!) 127.9 kg (282 lb) [id number] surface area is 2.64 meters squared. Body mass index is 33.44 kg/(m^2).  HEENT: Pupils equally reactive to light and accomodation  Neck: Supple without thyromegaly, carotid pulses 2+ Lungs: clear to auscultation bilaterally; no wheezes, rales, rhonchi Heart: Regular rate and rhythm. No gallops, 2/6 sem murmurs or rub Abdomen: soft nontender, nondistended, with normal bowel sounds Extremities: no cyanosis, clubbing, or edema Peripheral Pulses: 2+ in all extremities, 2+ femoral pulses bilaterally  Assessment   70 y.o. male with  1. Pre-operative clearance  2. SOB (shortness of breath)  3. Murmur  4. Mild obesity  5. OSA (obstructive sleep apnea)  6. Osteoarthritis, unspecified osteoarthritis type, unspecified site  7. Controlled type 2 diabetes mellitus without complication, unspecified long term insulin use status (CMS-HCC)   Plan  1 preoperative assessment recommend noninvasive cardiac evaluation prior to surgery 2 murmur edema preoperative clearance recommend echocardiogram for further evaluation 3 anginal equivalent shortness of breath preoperative clearance recommend exercise Myoview 4 hypertension reasonably controlled continue current medical therapy 5 diabetes type 2 uncomplicated continue medical therapy 6 obstructive sleep apnea possibly recommend sleep study CPAP  weight loss 7 continue over-the-counter medication to help with symptoms 8 have the patient follow-up in 1 year  Orders Placed This  Encounter  Procedures  . NM myocardial perfusion SPECT multiple (stress and rest)  . ECG stress test only  . Echocardiogram 2D complete   Return in about 1 year (around 08/18/2016).  Derek Kida, MD       Plan of Treatment - as of this encounter  Scheduled Tests Scheduled Tests  Name Priority Associated Diagnoses Order Schedule  Echocardiogram 2D complete Routine Pre-operative clearance

## 2015-09-21 NOTE — Patient Instructions (Signed)
  Your procedure is scheduled on: 09-29-15 Report to Same Day Surgery 2nd floor medical mall To find out your arrival time please call [phone] between 1PM - 3PM on  09-28-15  Remember: Instructions that are not followed completely may result in serious medical risk, up to and including death, or upon the discretion of your surgeon and anesthesiologist your surgery may need to be rescheduled.    _x___ 1. Do not eat food or drink liquids after midnight. No gum chewing or hard candies.     __x__ 2. No Alcohol for 24 hours before or after surgery.   __x__3. No Smoking for 24 prior to surgery.   ____  4. Bring all medications with you on the day of surgery if instructed.    __x__ 5. Notify your doctor if there is any change in your medical condition     (cold, fever, infections).     Do not wear jewelry, make-up, hairpins, clips or nail polish.  Do not wear lotions, powders, or perfumes. You may wear deodorant.  Do not shave 48 hours prior to surgery. Men may shave face and neck.  Do not bring valuables to the hospital.    [facility] is not responsible for any belongings or valuables.               Contacts, dentures or bridgework may not be worn into surgery.  Leave your suitcase in the car. After surgery it may be brought to your room.  For patients admitted to the hospital, discharge time is determined by your treatment team.   Patients discharged the day of surgery will not be allowed to drive home.    Please read over the following fact sheets that you were given:   [facility] Preparing for Surgery and or MRSA Information   ____ Take these medicines the morning of surgery with A SIP OF WATER:    1. NONE  2.  3.  4.  5.  6.  ____ Fleet Enema (as directed)   ____ Use CHG Soap or sage wipes as directed on instruction sheet   ____ Use inhalers on the day of surgery and bring to hospital day of surgery  ____ Stop metformin 2 days prior to surgery    ____ Take 1/2 of  usual insulin dose the night before surgery and none on the morning of   surgery.   ____ Stop aspirin or coumadin, or plavix  _x__ Stop Anti-inflammatories such as Advil, Aleve, Ibuprofen, Motrin, Naproxen,          Naprosyn, Goodies powders or aspirin products-STOP BC POWDERS AND [place] to take Tylenol.   ____ Stop supplements until after surgery.    ____ Bring C-Pap to the hospital.

## 2015-09-21 NOTE — Pre-Procedure Instructions (Signed)
CALLED OVER TO DR CALLWOODS OFFICE AFTER DOING PHONE INTERVIEW FOR SURGERY-PT HAS SEEN DR CALLWOOD FOR SURGERY CLEARANCE BACK IN August 19, 2015-DR CALLWOODS NOTE SAID THAT HE NEEDS A MYOVIEW TO BE CLEARED FOR HIS SURGERY ON THE 16TH OF AUGUST-PT STILL HAS NOT HAD MYOVIEW AND STATES THAT NO ONE HAS EVER CALLED HIM TO SET UP APPT FOR MYOVIEW-I TOLD RECEPTIONIST THAT PT WILL HAVE TO HAVE THIS DONE PRIOR TO SURGERY OR ANESTHESIA WILL CANEL SURGERY UNTIL THIS IS DONE-RECEPTIONIST SAID THAT SHE WILL RELAY THIS MESSAGE TO DR CALLWOODS NURSE

## 2015-09-29 ENCOUNTER — Ambulatory Visit: Payer: Medicare Other | Admitting: Certified Registered"

## 2015-09-29 ENCOUNTER — Ambulatory Visit
Admission: RE | Admit: 2015-09-29 | Discharge: 2015-09-29 | Disposition: A | Discharge status: Home / Self Care | Payer: Medicare Other | Source: Ambulatory Visit | Attending: General Surgery | Admitting: General Surgery

## 2015-09-29 ENCOUNTER — Encounter
Admission: RE | Disposition: A | Discharge status: Home / Self Care | Payer: Self-pay | Source: Ambulatory Visit | Attending: General Surgery

## 2015-09-29 DIAGNOSIS — Z9889 Other specified postprocedural states: Secondary | ICD-10-CM | POA: Insufficient documentation

## 2015-09-29 DIAGNOSIS — Z791 Long term (current) use of non-steroidal anti-inflammatories (NSAID): Secondary | ICD-10-CM | POA: Insufficient documentation

## 2015-09-29 DIAGNOSIS — M1990 Unspecified osteoarthritis, unspecified site: Secondary | ICD-10-CM | POA: Diagnosis not present

## 2015-09-29 DIAGNOSIS — R7303 Prediabetes: Secondary | ICD-10-CM | POA: Diagnosis not present

## 2015-09-29 DIAGNOSIS — G4730 Sleep apnea, unspecified: Secondary | ICD-10-CM | POA: Diagnosis not present

## 2015-09-29 DIAGNOSIS — Z87891 Personal history of nicotine dependence: Secondary | ICD-10-CM | POA: Insufficient documentation

## 2015-09-29 DIAGNOSIS — Z7982 Long term (current) use of aspirin: Secondary | ICD-10-CM | POA: Diagnosis not present

## 2015-09-29 DIAGNOSIS — K219 Gastro-esophageal reflux disease without esophagitis: Secondary | ICD-10-CM | POA: Insufficient documentation

## 2015-09-29 DIAGNOSIS — K4020 Bilateral inguinal hernia, without obstruction or gangrene, not specified as recurrent: Secondary | ICD-10-CM | POA: Insufficient documentation

## 2015-09-29 DIAGNOSIS — K4090 Unilateral inguinal hernia, without obstruction or gangrene, not specified as recurrent: Secondary | ICD-10-CM

## 2015-09-29 HISTORY — PX: INGUINAL HERNIA REPAIR: SHX194

## 2015-09-29 SURGERY — REPAIR, HERNIA, INGUINAL, BILATERAL, LAPAROSCOPIC
Anesthesia: General | Laterality: Bilateral

## 2015-09-29 MED ORDER — PROPOFOL 10 MG/ML IV BOLUS
INTRAVENOUS | Status: DC | PRN
  Administered 2015-09-29: 200 mg via INTRAVENOUS

## 2015-09-29 MED ORDER — ACETAMINOPHEN 10 MG/ML IV SOLN
INTRAVENOUS | Status: DC | PRN
  Administered 2015-09-29: 1000 mg via INTRAVENOUS

## 2015-09-29 MED ORDER — ONDANSETRON HCL 4 MG/2ML IJ SOLN: 4.0000 mg | Freq: Once | INTRAMUSCULAR | Status: DC | PRN

## 2015-09-29 MED ORDER — FENTANYL CITRATE (PF) 100 MCG/2ML IJ SOLN
25.0000 ug | INTRAMUSCULAR | Status: DC | PRN
  Administered 2015-09-29 (×4): 25 ug via INTRAVENOUS

## 2015-09-29 MED ORDER — LACTATED RINGERS IV SOLN
INTRAVENOUS | Status: DC
  Administered 2015-09-29 (×2): via INTRAVENOUS

## 2015-09-29 MED ORDER — DEXTROSE 5 % IV SOLN
3.0000 g | INTRAVENOUS | Status: AC
  Administered 2015-09-29: 3 g via INTRAVENOUS
  Filled 2015-09-29: qty 3000

## 2015-09-29 MED ORDER — FENTANYL CITRATE (PF) 100 MCG/2ML IJ SOLN: INTRAMUSCULAR | Status: DC | PRN

## 2015-09-29 MED ORDER — KETOROLAC TROMETHAMINE 30 MG/ML IJ SOLN
INTRAMUSCULAR | Status: DC | PRN
  Administered 2015-09-29: 30 mg via INTRAVENOUS

## 2015-09-29 MED ORDER — FAMOTIDINE 20 MG PO TABS
ORAL_TABLET | ORAL | Status: AC
  Administered 2015-09-29: 20 mg via ORAL
  Filled 2015-09-29: qty 1

## 2015-09-29 MED ORDER — SUGAMMADEX SODIUM 200 MG/2ML IV SOLN
INTRAVENOUS | Status: DC | PRN
  Administered 2015-09-29: 200 mg via INTRAVENOUS

## 2015-09-29 MED ORDER — ACETAMINOPHEN 10 MG/ML IV SOLN
INTRAVENOUS | Status: AC
  Filled 2015-09-29: qty 100

## 2015-09-29 MED ORDER — ONDANSETRON HCL 4 MG/2ML IJ SOLN
INTRAMUSCULAR | Status: DC | PRN
  Administered 2015-09-29: 4 mg via INTRAVENOUS

## 2015-09-29 MED ORDER — FENTANYL CITRATE (PF) 100 MCG/2ML IJ SOLN
INTRAMUSCULAR | Status: DC | PRN
  Administered 2015-09-29: 50 ug via INTRAVENOUS
  Administered 2015-09-29: 100 ug via INTRAVENOUS
  Administered 2015-09-29: 50 ug via INTRAVENOUS

## 2015-09-29 MED ORDER — HYDROCODONE-ACETAMINOPHEN 5-325 MG PO TABS
ORAL_TABLET | ORAL | Status: AC
  Administered 2015-09-29: 1 via ORAL
  Filled 2015-09-29: qty 1

## 2015-09-29 MED ORDER — FENTANYL CITRATE (PF) 100 MCG/2ML IJ SOLN
INTRAMUSCULAR | Status: AC
  Filled 2015-09-29: qty 2

## 2015-09-29 MED ORDER — HYDROCODONE-ACETAMINOPHEN 5-325 MG PO TABS: 1.0000 | ORAL_TABLET | ORAL | 0 refills | Status: DC | PRN

## 2015-09-29 MED ORDER — MIDAZOLAM HCL 2 MG/2ML IJ SOLN
INTRAMUSCULAR | Status: DC | PRN
  Administered 2015-09-29: 2 mg via INTRAVENOUS

## 2015-09-29 MED ORDER — LIDOCAINE HCL (CARDIAC) 20 MG/ML IV SOLN
INTRAVENOUS | Status: DC | PRN
  Administered 2015-09-29: 100 mg via INTRAVENOUS

## 2015-09-29 MED ORDER — FAMOTIDINE 20 MG PO TABS
20.0000 mg | ORAL_TABLET | Freq: Once | ORAL | Status: AC
  Administered 2015-09-29: 20 mg via ORAL

## 2015-09-29 MED ORDER — ROCURONIUM BROMIDE 100 MG/10ML IV SOLN
INTRAVENOUS | Status: DC | PRN
  Administered 2015-09-29 (×2): 10 mg via INTRAVENOUS
  Administered 2015-09-29: 30 mg via INTRAVENOUS
  Administered 2015-09-29: 50 mg via INTRAVENOUS

## 2015-09-29 MED ORDER — DEXAMETHASONE SODIUM PHOSPHATE 10 MG/ML IJ SOLN
INTRAMUSCULAR | Status: DC | PRN
  Administered 2015-09-29: 10 mg via INTRAVENOUS

## 2015-09-29 MED ORDER — PHENYLEPHRINE HCL 10 MG/ML IJ SOLN
INTRAMUSCULAR | Status: DC | PRN
  Administered 2015-09-29: 100 ug via INTRAVENOUS

## 2015-09-29 SURGICAL SUPPLY — 36 items
BLADE SURG 11 STRL SS SAFETY (MISCELLANEOUS) ×2 IMPLANT
CANNULA DILATOR 10 W/SLV (CANNULA) ×2 IMPLANT
CANNULA DILATOR 5 W/SLV (CANNULA) ×4 IMPLANT
CATH TRAY 16F METER LATEX (MISCELLANEOUS) ×2 IMPLANT
CHLORAPREP W/TINT 26ML (MISCELLANEOUS) ×2 IMPLANT
DEVICE SECURE STRAP 25 ABSORB (INSTRUMENTS) ×2 IMPLANT
DISSECT BALLN SPACEMKR OVL PDB (BALLOONS) ×2
DISSECTOR BALLN SPCMKR OVL PDB (BALLOONS) ×1 IMPLANT
DISSECTOR KITTNER STICK (MISCELLANEOUS) ×1 IMPLANT
DISSECTORS/KITTNER STICK (MISCELLANEOUS) ×2
DRESSING TELFA 4X3 1S ST N-ADH (GAUZE/BANDAGES/DRESSINGS) ×2 IMPLANT
DRSG TEGADERM 2-3/8X2-3/4 SM (GAUZE/BANDAGES/DRESSINGS) ×6 IMPLANT
ELECT REM PT RETURN 9FT ADLT (ELECTROSURGICAL) ×2
ELECTRODE REM PT RTRN 9FT ADLT (ELECTROSURGICAL) ×1 IMPLANT
GLOVE BIO SURGEON STRL SZ7.5 (GLOVE) ×2 IMPLANT
GLOVE INDICATOR 8.0 STRL GRN (GLOVE) ×2 IMPLANT
GOWN STRL REUS W/ TWL LRG LVL3 (GOWN DISPOSABLE) ×2 IMPLANT
GOWN STRL REUS W/TWL LRG LVL3 (GOWN DISPOSABLE) ×2
KIT RM TURNOVER STRD PROC AR (KITS) ×2 IMPLANT
LABEL OR SOLS (LABEL) ×2 IMPLANT
MESH 3DMAX 3X5 LT MED (Mesh General) ×2 IMPLANT
MESH 3DMAX 3X5 RT MED (Mesh General) ×2 IMPLANT
NS IRRIG 500ML POUR BTL (IV SOLUTION) ×2 IMPLANT
PACK LAP CHOLECYSTECTOMY (MISCELLANEOUS) ×2 IMPLANT
SCISSORS METZENBAUM CVD 33 (INSTRUMENTS) ×2 IMPLANT
STRIP CLOSURE SKIN 1/2X4 (GAUZE/BANDAGES/DRESSINGS) ×2 IMPLANT
SUT MAXON ABS #0 GS21 30IN (SUTURE) ×2 IMPLANT
SUT VIC AB 0 SH 27 (SUTURE) ×2 IMPLANT
SUT VIC AB 4-0 FS2 27 (SUTURE) ×2 IMPLANT
SUT VICRYL 0 TIES 12 18 (SUTURE) ×2 IMPLANT
SWABSTK COMLB BENZOIN TINCTURE (MISCELLANEOUS) ×2 IMPLANT
TACKER 5MM HERNIA 3.5CML NAB (ENDOMECHANICALS) ×4 IMPLANT
TROCAR BALLN 10M OMST10SB SPAC (TROCAR) ×2 IMPLANT
TROCAR XCEL 12X100 BLDLESS (ENDOMECHANICALS) IMPLANT
TUBING INSUFFLATOR HI FLOW (MISCELLANEOUS) ×2 IMPLANT
WATER STERILE IRR 1000ML POUR (IV SOLUTION) ×2 IMPLANT

## 2015-09-29 NOTE — Transfer of Care (Signed)
Immediate Anesthesia Transfer of Care Note  Patient: Derek Wright  Procedure(s) Performed: Procedure(s): LAPAROSCOPIC BILATERAL INGUINAL HERNIA REPAIR (Bilateral)  Patient Location: PACU  Anesthesia Type:General  Level of Consciousness: awake, alert , oriented and patient cooperative  Airway & Oxygen Therapy: Patient Spontanous Breathing and Patient connected to face mask oxygen  Post-op Assessment: Report given to RN, Post -op Vital signs reviewed and stable and Patient moving all extremities X 4  Post vital signs: Reviewed and stable  Last Vitals:  Vitals:   09/29/15 1117  BP: 138/89  Pulse: 71  Resp: 18  Temp: 36.7 C    Last Pain:  Vitals:   09/29/15 1117  TempSrc: Oral  PainSc: 0-No pain         Complications: No apparent anesthesia complications

## 2015-09-29 NOTE — Anesthesia Procedure Notes (Signed)
Procedure Name: Intubation Date/Time: 09/29/2015 12:53 PM Performed by: Silvana Newness Pre-anesthesia Checklist: Patient identified, Emergency Drugs available, Suction available, Patient being monitored and Timeout performed Patient Re-evaluated:Patient Re-evaluated prior to inductionOxygen Delivery Method: Circle system utilized Preoxygenation: Pre-oxygenation with 100% oxygen Intubation Type: IV induction Ventilation: Mask ventilation without difficulty Laryngoscope Size: Mac and 4 Grade View: Grade II Tube type: Oral Tube size: 7.5 mm Number of attempts: 1 Airway Equipment and Method: Rigid stylet Placement Confirmation: ETT inserted through vocal cords under direct vision,  positive ETCO2 and breath sounds checked- equal and bilateral Secured at: 20 cm Tube secured with: Tape Dental Injury: Teeth and Oropharynx as per pre-operative assessment

## 2015-09-29 NOTE — Progress Notes (Signed)
Ice packs to right and left lower abdomen

## 2015-09-29 NOTE — H&P (Signed)
CHRISTOPHERMICH SENDEJO [id number] December 02, 1945     HPI: Healthy male with bilateral inguinal hernias for repair. Reports he passes his stress test with Dr. Clayborn Bigness last Thursday.    Prescriptions Prior to Admission  Medication Sig Dispense Refill Last Dose  . Aspirin-Salicylamide-Caffeine (BC HEADACHE PO) Take 1 packet by mouth daily as needed.   09/22/2015  . Menthol, Topical Analgesic, (ICY HOT EX) Apply 1 application topically daily as needed. For pain     . naproxen sodium (ALEVE) 220 MG tablet Take 220 mg by mouth as needed.   09/15/2015   No Known Allergies Past Medical History:  Diagnosis Date  . Arthritis   . GERD (gastroesophageal reflux disease)    RARE-NO MEDS  . Pre-diabetes   . Sleep apnea    MILD-NO CPAP-COULD NOT TOELERATE   Past Surgical History:  Procedure Laterality Date  . COLONOSCOPY  0000000  . hair folicle removed    . SKIN GRAFT Right 1966   hand   Social History   Social History  . Marital status: Married    Spouse name: N/A  . Number of children: N/A  . Years of education: N/A   Occupational History  . Not on file.   Social History Main Topics  . Smoking status: Former Smoker    Packs/day: 0.25    Years: 15.00    Types: Cigarettes    Quit date: 09/13/1980  . Smokeless tobacco: Never Used  . Alcohol use 0.0 oz/week     Comment: rare  . Drug use: No  . Sexual activity: Not on file   Other Topics Concern  . Not on file   Social History Narrative  . No narrative on file   Social History   Social History Narrative  . No narrative on file     ROS: Negative.     PE: HEENT: Negative. Lungs: Clear. Cardio: RR.  Assessment/Plan:  Proceed with planned inguinal hernia repair.   Robert Bellow 09/29/2015

## 2015-09-29 NOTE — Discharge Instructions (Signed)
AMBULATORY SURGERY  DISCHARGE INSTRUCTIONS   1) The drugs that you were given will stay in your system until tomorrow so for the next 24 hours you should not:  A) Drive an automobile B) Make any legal decisions C) Drink any alcoholic beverage   2) You may resume regular meals tomorrow.  Today it is better to start with liquids and gradually work up to solid foods.  You may eat anything you prefer, but it is better to start with liquids, then soup and crackers, and gradually work up to solid foods.   3) Please notify your doctor immediately if you have any unusual bleeding, trouble breathing, redness and pain at the surgery site, drainage, fever, or pain not relieved by medication.    4) Additional Instructions:        Please contact your physician with any problems or Same Day Surgery at [phone], Monday through Friday 6 am to 4 pm, or [place] at [facility] number at [phone].Inguinal Hernia, Adult , Care After Refer to this sheet in the next few weeks. These discharge instructions provide you with general information on caring for yourself after you leave the hospital. Your caregiver may also give you specific instructions. Your treatment has been planned according to the most current medical practices available, but unavoidable complications sometimes occur. If you have any problems or questions after discharge, please call your caregiver. HOME CARE INSTRUCTIONS  Put ice on the operative site.  Put ice in a plastic bag.  Place a towel between your skin and the bag.  Leave the ice on for 15-20 minutes at a time, 03-04 times a day while awake.  Change bandages (dressings) as directed.  Keep the wound dry and clean. The wound may be washed gently with soap and water. Gently blot or dab the wound dry. It is okay to take showers 24 to 48 hours after surgery. Do not take baths, use swimming pools, or use hot tubs for 10 days, or as directed by your  caregiver.  Only take over-the-counter or prescription medicines for pain, discomfort, or fever as directed by your caregiver.  Continue your normal diet as directed.  Do not lift anything more than 10 pounds or play contact sports for 3 weeks, or as directed. SEEK MEDICAL CARE IF:  There is redness, swelling, or increasing pain in the wound.  There is fluid (pus) coming from the wound.  There is drainage from a wound lasting longer than 1 day.  You have an oral temperature above 102 F (38.9 C).  You notice a bad smell coming from the wound or dressing.  The wound breaks open after the stitches (sutures) have been removed.  You notice increasing pain in the shoulders (shoulder strap areas).  You develop dizzy episodes or fainting while standing.  You feel sick to your stomach (nauseous) or throw up (vomit). SEEK IMMEDIATE MEDICAL CARE IF:  You develop a rash.  You have difficulty breathing.  You develop a reaction or have side effects to medicines you were given. MAKE SURE YOU:   Understand these instructions.  Will watch your condition.  Will get help right away if you are not doing well or get worse.   This information is not intended to replace advice given to you by your health care provider. Make sure you discuss any questions you have with your health care provider.   Document Released: 03/02/2006 Document Revised: 02/20/2014 Document Reviewed: 08/03/2014 Elsevier Interactive Patient Education [employer].

## 2015-09-29 NOTE — Anesthesia Preprocedure Evaluation (Signed)
Anesthesia Evaluation  Patient identified by MRN, date of birth, ID band Patient awake    Reviewed: Allergy & Precautions, H&P , NPO status , Patient's Chart, lab work & pertinent test results, reviewed documented beta blocker date and time   Airway Mallampati: III  TM Distance: >3 FB Neck ROM: full    Dental  (+) Poor Dentition   Pulmonary neg pulmonary ROS, sleep apnea , former smoker,    Pulmonary exam normal        Cardiovascular negative cardio ROS Normal cardiovascular exam Rhythm:regular Rate:Normal     Neuro/Psych negative neurological ROS  negative psych ROS   GI/Hepatic negative GI ROS, Neg liver ROS, GERD  Medicated,  Endo/Other  negative endocrine ROS  Renal/GU negative Renal ROS  negative genitourinary   Musculoskeletal   Abdominal   Peds  Hematology negative hematology ROS (+)   Anesthesia Other Findings Past Medical History: No date: Arthritis No date: GERD (gastroesophageal reflux disease)     Comment: RARE-NO MEDS No date: Pre-diabetes No date: Sleep apnea     Comment: MILD-NO CPAP-COULD NOT TOELERATE Past Surgical History: 2012: COLONOSCOPY No date: hair folicle removed [id number]: SKIN GRAFT Right     Comment: hand   Reproductive/Obstetrics negative OB ROS                             Anesthesia Physical Anesthesia Plan  ASA: III  Anesthesia Plan: General   Post-op Pain Management:    Induction:   Airway Management Planned:   Additional Equipment:   Intra-op Plan:   Post-operative Plan:   Informed Consent: I have reviewed the patients History and Physical, chart, labs and discussed the procedure including the risks, benefits and alternatives for the proposed anesthesia with the patient or authorized representative who has indicated his/her understanding and acceptance.   Dental Advisory Given  Plan Discussed with: CRNA  Anesthesia Plan Comments:          Anesthesia Quick Evaluation

## 2015-09-29 NOTE — Op Note (Signed)
Preoperative diagnosis: Bilateral inguinal hernias.  Postoperative diagnosis: Same, right direct, left indirect.  Operative procedure: Bilateral laparoscopic inguinal hernia repair with Bard 3-D mesh.  Operating surgeon: Ollen Bowl, M.D.  Anesthesia: Gen. endotracheal.  Estimated blood loss: 5 mL.  Clinical note: This 70 year old male presented with complaints of a left inguinal hernia. Clinical examination showed evidence of a right inguinal hernia as well. He was felt to be a candidate for bilateral inguinal hernia repair. The patient received 3 g intravenously prior to the procedure based on weight. SCD stockings for DVT prevention. Hair was removed with clippers prior to presentation of the operating theater.  Operative note: With the patient under adequate general endotracheal anesthesia is the arms were tucked at the side and supported by arm boards based on his width. In Trendelenburg position a varies needle was placed through trans-umbilical incision after lensing the skin with ChloraPrep. After assuring intra-abdominal location with the hanging drop test the abdomen was insufflated with CO2. A 10 mm Step port was expanded and inspection showed no evidence of injury from the initial port placement.  2-5 mm Step ports were then placed under direct vision laterally at the level of the umbilicus and just outside the rectus fascia. Inspection showed evidence of a direct defect on the right and indirect defect on the left. The right side was approached first. The peritoneum about 3 cm above the defect was incised and extended firstly to allow exposure of the posterior wall inguinal canal. The hernia sac was dissected free and the pubic tubercle and Cooper's ligament exposed. A medium Bard 3-D mesh, right, was then brought into the field to the 10 mm umbilical port. This was anchored to the pubic tubercle with a secure strap tach. Several tacks were then placed along Cooper's ligament and  then tacks placed along the upper abdominal wall avoiding the inferior epigastric vessels. The peritoneum was used to cover the mesh. Good hemostasis was noted.  Attention was turned to the left side were a direct defect was noted. The peritoneum 3 cm above the internal inguinal ring was incised as noted on the contralateral side. The preperitoneal space was opened. A generous sac was dissected free of the cord structures. Medium Bard 3-D mesh, left was then placed and again anchored to the pubic tubercle with a secure strap tach. Several tacks were placed along Cooper's ligament. The mesh was smoothed laterally and anchored superiorly avoiding the inferior epigastric vessels. Peritoneum was closed as noted above for the right side.  The fascial defect at the umbilical site was closed with a 0 Maxon suture making use of a "cold" suture passer. A single suture was utilized. The abdomen was desufflated under direct vision. Good hemostasis was noted. Skin incisions were closed with 4-0 Vicryl septic sutures. Benzoin, Steri-Strips, Telfa and Tegaderm dressings were applied.  The patient tolerated the procedure well and was taken to the recovery room in stable condition.

## 2015-09-29 NOTE — Progress Notes (Signed)
Pain level 3

## 2015-09-30 ENCOUNTER — Encounter: Payer: Self-pay | Admitting: General Surgery

## 2015-10-01 NOTE — Anesthesia Postprocedure Evaluation (Signed)
Anesthesia Post Note  Patient: Derek Wright  Procedure(s) Performed: Procedure(s) (LRB): LAPAROSCOPIC BILATERAL INGUINAL HERNIA REPAIR (Bilateral)  Patient location during evaluation: PACU Anesthesia Type: General Level of consciousness: awake and alert Pain management: pain level controlled Vital Signs Assessment: post-procedure vital signs reviewed and stable Respiratory status: spontaneous breathing, nonlabored ventilation, respiratory function stable and patient connected to nasal cannula oxygen Cardiovascular status: blood pressure returned to baseline and stable Postop Assessment: no signs of nausea or vomiting Anesthetic complications: no    Last Vitals:  Vitals:   09/29/15 1617 09/29/15 1700  BP: (!) 147/77 125/66  Pulse: 81 79  Resp: 16   Temp: 36.2 C     Last Pain:  Vitals:   09/29/15 1617  TempSrc: Tympanic  PainSc: 4                  Molli Barrows

## 2015-10-08 ENCOUNTER — Encounter: Payer: Self-pay | Admitting: General Surgery

## 2015-10-08 ENCOUNTER — Telehealth: Payer: Self-pay | Admitting: General Surgery

## 2015-10-08 NOTE — Telephone Encounter (Signed)
PT CALLED IN STATED HE HAD RECENTLY HAD HERNIA SX BY DR BYRNETT.HE WANTED TO KNOW IF HE COULD USE A WEED TRIMMER? I ASK DR BYRNETT &  HE STATES THE PT COULD USE ONE. THE INFORMATION WAS RELAYED TO PT./MTH

## 2015-10-11 ENCOUNTER — Encounter: Payer: Self-pay | Admitting: General Surgery

## 2015-10-11 ENCOUNTER — Ambulatory Visit (INDEPENDENT_AMBULATORY_CARE_PROVIDER_SITE_OTHER): Payer: Medicare Other | Admitting: General Surgery

## 2015-10-11 VITALS — BP 134/72 | HR 76 | Resp 14 | Ht 77.0 in | Wt 278.0 lb

## 2015-10-11 DIAGNOSIS — K4090 Unilateral inguinal hernia, without obstruction or gangrene, not specified as recurrent: Secondary | ICD-10-CM

## 2015-10-11 NOTE — Patient Instructions (Signed)
Plan to follow up in 6 weeks. Increase activity as needed. Use motrin as needed.

## 2015-10-11 NOTE — Progress Notes (Signed)
[name], male   DOB: [birth year], 70 y.o.   MRN: [id number]  Chief Complaint  Patient presents with  . Routine Post Op    bilateral inguinal hernia     HPI Derek Wright is a 70 y.o. male here today for a post op bilateral inguinal hernias done on 09/29/15. Patient states he is doing well. Taking pain medication as needed. The patient has a few of the hydrocodone left, but is eating these less and less. He remains active at home. Denies any urinary or bowel difficulties.  HPI  Past Medical History:  Diagnosis Date  . Arthritis   . GERD (gastroesophageal reflux disease)    RARE-NO MEDS  . Pre-diabetes   . Sleep apnea    MILD-NO CPAP-COULD NOT TOELERATE    Past Surgical History:  Procedure Laterality Date  . COLONOSCOPY  0000000  . hair folicle removed    . INGUINAL HERNIA REPAIR Bilateral 09/29/2015   Procedure: LAPAROSCOPIC BILATERAL INGUINAL HERNIA REPAIR;  Surgeon: Robert Bellow, MD;  Location: [facility] ORS;  Service: General;  Laterality: Bilateral;  . SKIN GRAFT Right 1966   hand    Family History  Problem Relation Age of Onset  . Ovarian cancer Mother   . Heart disease Father   . Lymphoma Maternal Uncle     Social History Social History  Substance Use Topics  . Smoking status: Former Smoker    Packs/day: 0.25    Years: 15.00    Types: Cigarettes    Quit date: 09/13/1980  . Smokeless tobacco: Never Used  . Alcohol use 0.0 oz/week     Comment: rare    No Known Allergies  Current Outpatient Prescriptions  Medication Sig Dispense Refill  . HYDROcodone-acetaminophen (NORCO) 5-325 MG tablet Take 1-2 tablets by mouth every 4 (four) hours as needed for moderate pain. 30 tablet 0  . Menthol, Topical Analgesic, (ICY HOT EX) Apply 1 application topically daily as needed. For pain    . naproxen sodium (ALEVE) 220 MG tablet Take 220 mg by mouth as needed.     No current facility-administered medications for this visit.     Review of Systems Review of  Systems  Constitutional: Negative.   Respiratory: Negative.   Cardiovascular: Negative.     Blood pressure 134/72, pulse 76, resp. rate 14, height 6\' 5"  (1.956 m), weight 278 lb (126.1 kg).  Physical Exam Physical Exam  Constitutional: He is oriented to person, place, and time. He appears well-developed and well-nourished.  Abdominal:    Well healed incisions.   Neurological: He is alert and oriented to person, place, and time.  Skin: Skin is warm and dry.    Data Reviewed Bilateral laparoscopic inguinal hernia repair with Bard 3-D mesh.    Assessment     Doing well status post inguinal hernia repair.    Plan  Proper lifting techniques reviewed. Patient may resume driving when he is comfortable.    Plan to follow up in 6 weeks. Increase activity as needed. Proper lifting techniques reviewed.         Robert Bellow 10/11/2015, 8:18 PM

## 2015-11-23 ENCOUNTER — Ambulatory Visit (INDEPENDENT_AMBULATORY_CARE_PROVIDER_SITE_OTHER): Payer: Medicare Other | Admitting: General Surgery

## 2015-11-23 ENCOUNTER — Encounter: Payer: Self-pay | Admitting: General Surgery

## 2015-11-23 VITALS — BP 130/84 | HR 72 | Resp 14 | Ht 77.0 in | Wt 284.0 lb

## 2015-11-23 DIAGNOSIS — K4090 Unilateral inguinal hernia, without obstruction or gangrene, not specified as recurrent: Secondary | ICD-10-CM

## 2015-11-23 NOTE — Progress Notes (Deleted)
[name], male   DOB: 07/15/1945, 70 y.o.   MRN: [id number]  Chief Complaint  Patient presents with  . Routine Post Op    HPI Derek Wright is a 70 y.o. male here today for a post op bilateral inguinal hernias done on 09/29/15. Patient states he is doing well.  HPI  Past Medical History:  Diagnosis Date  . Arthritis   . GERD (gastroesophageal reflux disease)    RARE-NO MEDS  . Pre-diabetes   . Sleep apnea    MILD-NO CPAP-COULD NOT TOELERATE    Past Surgical History:  Procedure Laterality Date  . COLONOSCOPY  0000000  . hair folicle removed    . INGUINAL HERNIA REPAIR Bilateral 09/29/2015   Procedure: LAPAROSCOPIC BILATERAL INGUINAL HERNIA REPAIR;  Surgeon: Robert Bellow, MD;  Location: [facility] ORS;  Service: General;  Laterality: Bilateral;  . SKIN GRAFT Right 1966   hand    Family History  Problem Relation Age of Onset  . Ovarian cancer Mother   . Heart disease Father   . Lymphoma Maternal Uncle     Social History Social History  Substance Use Topics  . Smoking status: Former Smoker    Packs/day: 0.25    Years: 15.00    Types: Cigarettes    Quit date: 09/13/1980  . Smokeless tobacco: Never Used  . Alcohol use 0.0 oz/week     Comment: rare    No Known Allergies  Current Outpatient Prescriptions  Medication Sig Dispense Refill  . Menthol, Topical Analgesic, (ICY HOT EX) Apply 1 application topically daily as needed. For pain    . naproxen sodium (ALEVE) 220 MG tablet Take 220 mg by mouth as needed.     No current facility-administered medications for this visit.     Review of Systems Review of Systems  Blood pressure 130/84, pulse 72, resp. rate 14, height 6\' 5"  (1.956 m), weight 284 lb (128.8 kg).  Physical Exam Physical Exam  Data Reviewed ***  Assessment    ***    Plan    ***      This information has been scribed by Gaspar Cola CMA.  Gaspar Cola 11/23/2015, 9:43 AM

## 2015-11-23 NOTE — Progress Notes (Signed)
[name], male   DOB: Oct 16, 1945, 70 y.o.   MRN: [id number]  Chief Complaint  Patient presents with  . Routine Post Op    HPI Derek Wright is a 70 y.o. male.  Here today for postoperative bilateral inguinal hernia surgery on 09-29-15. Patient states he is doing well. Did note some mild groin discomfort with long periods of bending.  HPI  Past Medical History:  Diagnosis Date  . Arthritis   . GERD (gastroesophageal reflux disease)    RARE-NO MEDS  . Pre-diabetes   . Sleep apnea    MILD-NO CPAP-COULD NOT TOELERATE    Past Surgical History:  Procedure Laterality Date  . COLONOSCOPY  0000000  . hair folicle removed    . INGUINAL HERNIA REPAIR Bilateral 09/29/2015   Procedure: LAPAROSCOPIC BILATERAL INGUINAL HERNIA REPAIR;  Surgeon: Robert Bellow, MD;  Location: [facility] ORS;  Service: General;  Laterality: Bilateral;  . SKIN GRAFT Right 1966   hand    Family History  Problem Relation Age of Onset  . Ovarian cancer Mother   . Heart disease Father   . Lymphoma Maternal Uncle     Social History Social History  Substance Use Topics  . Smoking status: Former Smoker    Packs/day: 0.25    Years: 15.00    Types: Cigarettes    Quit date: 09/13/1980  . Smokeless tobacco: Never Used  . Alcohol use 0.0 oz/week     Comment: rare    No Known Allergies  Current Outpatient Prescriptions  Medication Sig Dispense Refill  . Menthol, Topical Analgesic, (ICY HOT EX) Apply 1 application topically daily as needed. For pain    . naproxen sodium (ALEVE) 220 MG tablet Take 220 mg by mouth as needed.     No current facility-administered medications for this visit.     Review of Systems Review of Systems  Constitutional: Negative.   Respiratory: Negative.   Cardiovascular: Negative.     Blood pressure 130/84, pulse 72, resp. rate 14, height 6\' 5"  (1.956 m), weight 284 lb (128.8 kg).  Physical Exam Physical Exam  Constitutional: He is oriented to person, place, and time.  He appears well-developed and well-nourished.  Genitourinary:     Neurological: He is alert and oriented to person, place, and time.  Skin: Skin is warm and dry.     Assessment    No evidence of recurrent hernia.    Plan    Proper lifting techniques reviewed.    Patient to return as needed.  This information has been scribed by Gaspar Cola CMA.   Robert Bellow 11/23/2015, 9:26 PM

## 2015-11-23 NOTE — Patient Instructions (Addendum)
The patient is aware to call back for any questions or concerns. Return as needed. Proper lifting techniques reviewed.  

## 2016-01-01 ENCOUNTER — Emergency Department: Payer: Medicare Other

## 2016-01-01 ENCOUNTER — Emergency Department
Admission: EM | Admit: 2016-01-01 | Discharge: 2016-01-01 | Disposition: A | Discharge status: Home / Self Care | Payer: Medicare Other | Attending: Student in an Organized Health Care Education/Training Program | Admitting: Student in an Organized Health Care Education/Training Program

## 2016-01-01 ENCOUNTER — Encounter: Payer: Self-pay | Admitting: Emergency Medicine

## 2016-01-01 DIAGNOSIS — R42 Dizziness and giddiness: Secondary | ICD-10-CM | POA: Diagnosis not present

## 2016-01-01 DIAGNOSIS — Z79899 Other long term (current) drug therapy: Secondary | ICD-10-CM | POA: Insufficient documentation

## 2016-01-01 DIAGNOSIS — R079 Chest pain, unspecified: Secondary | ICD-10-CM | POA: Diagnosis not present

## 2016-01-01 DIAGNOSIS — R0789 Other chest pain: Secondary | ICD-10-CM | POA: Diagnosis present

## 2016-01-01 DIAGNOSIS — Z87891 Personal history of nicotine dependence: Secondary | ICD-10-CM | POA: Insufficient documentation

## 2016-01-01 LAB — CBC
HEMATOCRIT: 42 % (ref 40.0–52.0)
HEMOGLOBIN: 14.7 g/dL (ref 13.0–18.0)
MCH: 32.9 pg (ref 26.0–34.0)
MCHC: 35.1 g/dL (ref 32.0–36.0)
MCV: 93.7 fL (ref 80.0–100.0)
Platelets: 196 10*3/uL (ref 150–440)
RBC: 4.48 MIL/uL (ref 4.40–5.90)
RDW: 13.8 % (ref 11.5–14.5)
WBC: 5.5 10*3/uL (ref 3.8–10.6)

## 2016-01-01 LAB — BASIC METABOLIC PANEL
ANION GAP: 7 (ref 5–15)
BUN: 20 mg/dL (ref 6–20)
CALCIUM: 9 mg/dL (ref 8.9–10.3)
CO2: 24 mmol/L (ref 22–32)
Chloride: 106 mmol/L (ref 101–111)
Creatinine, Ser: 0.88 mg/dL (ref 0.61–1.24)
GFR calc Af Amer: >60 mL/min (ref >60)
GLUCOSE: 102 mg/dL — AB (ref 65–99)
POTASSIUM: 3.9 mmol/L (ref 3.5–5.1)
Sodium: 137 mmol/L (ref 135–145)

## 2016-01-01 LAB — TROPONIN I: Troponin I: <0.03 ng/mL (ref <0.03)

## 2016-01-01 MED ORDER — METOCLOPRAMIDE HCL 10 MG PO TABS
10.0000 mg | ORAL_TABLET | Freq: Two times a day (BID) | ORAL | 0 refills | Status: DC
Start: 2016-01-01 — End: 2016-08-17

## 2016-01-01 NOTE — ED Provider Notes (Signed)
[facility] Emergency Department Provider Note    First MD Initiated Contact with Patient 01/01/16 1618     (approximate)  I have reviewed the triage vital signs and the nursing notes.   HISTORY  Chief Complaint Chest Pain    HPI Derek Wright is a 70 y.o. male with a history of arthritis as well as prediabetes and "light heart attack" in 2007. Patient states that he was having an otherwise normal today. Went to the [employer]. While standing had a brief episode of 3-4 minutes of left-sided chest discomfort without radiation followed by a period of lightheadedness. He denies any nausea or tingling. Denies any chest pain or pressure. There is no diaphoresis. There was no nausea or vomiting. There is no shortness of breath. Patient was able to am really back to his truck from the parade without any worsening chest pain or shortness of breath. He denies any orthopnea. He denies any lower extremity edema. Patient is status post hernia repair in August. States is been having episodes of increasing gaseous and belching and stomach after eating. Denies any changes in medications. Denies any fevers.   Past Medical History:  Diagnosis Date  . Arthritis   . GERD (gastroesophageal reflux disease)    RARE-NO MEDS  . Pre-diabetes   . Sleep apnea    MILD-NO CPAP-COULD NOT TOELERATE   Family History  Problem Relation Age of Onset  . Ovarian cancer Mother   . Heart disease Father   . Lymphoma Maternal Uncle    Past Surgical History:  Procedure Laterality Date  . COLONOSCOPY  0000000  . hair folicle removed    . INGUINAL HERNIA REPAIR Bilateral 09/29/2015   Procedure: LAPAROSCOPIC BILATERAL INGUINAL HERNIA REPAIR;  Surgeon: Robert Bellow, MD;  Location: [facility] ORS;  Service: General;  Laterality: Bilateral;  . SKIN GRAFT Right 1966   hand   Patient Active Problem List   Diagnosis Date Noted  . Right inguinal hernia 09/14/2015  . Ankle edema 09/13/2015  .  Hernia, inguinal, left 07/14/2015  . Arthralgia 08/12/2014  . Pre-diabetes 08/12/2014  . BPH without obstruction/lower urinary tract symptoms 08/12/2014      Prior to Admission medications   Medication Sig Start Date End Date Taking? Authorizing Provider  Menthol, Topical Analgesic, (ICY HOT EX) Apply 1 application topically daily as needed. For pain    Historical Provider, MD  metoCLOPramide (REGLAN) 10 MG tablet Take 1 tablet (10 mg total) by mouth 2 (two) times daily. 01/01/16 01/06/16  Merlyn Lot, MD  naproxen sodium (ALEVE) 220 MG tablet Take 220 mg by mouth as needed.    Historical Provider, MD    Allergies Patient has no known allergies.    Social History Social History  Substance Use Topics  . Smoking status: Former Smoker    Packs/day: 0.25    Years: 15.00    Types: Cigarettes    Quit date: 09/13/1980  . Smokeless tobacco: Never Used  . Alcohol use 0.0 oz/week     Comment: rare    Review of Systems Patient denies headaches, rhinorrhea, blurry vision, numbness, shortness of breath, chest pain, edema, cough, abdominal pain, nausea, vomiting, diarrhea, dysuria, fevers, rashes or hallucinations unless otherwise stated above in HPI. ____________________________________________   PHYSICAL EXAM:  VITAL SIGNS: Vitals:   01/01/16 1634 01/01/16 1732  BP: 136/85 (!) 132/92  Pulse: 69 80  Resp: 15 16  Temp:      Constitutional: Alert and oriented. Well appearing and  in no acute distress. Eyes: Conjunctivae are normal. PERRL. EOMI. Head: Atraumatic. Nose: No congestion/rhinnorhea. Mouth/Throat: Mucous membranes are moist.  Oropharynx non-erythematous. Neck: No stridor. Painless ROM. No cervical spine tenderness to palpation Hematological/Lymphatic/Immunilogical: No cervical lymphadenopathy. Cardiovascular: Normal rate, regular rhythm. Grossly normal heart sounds.  Good peripheral circulation. Respiratory: Normal respiratory effort.  No retractions. Lungs  CTAB. Gastrointestinal: Soft and nontender. No distention. No abdominal bruits. No CVA tenderness. Musculoskeletal: No lower extremity tenderness nor edema.  No joint effusions. Neurologic:  Normal speech and language. No gross focal neurologic deficits are appreciated. No gait instability. Skin:  Skin is warm, dry and intact. No rash noted. Psychiatric: Mood and affect are normal. Speech and behavior are normal.  ____________________________________________   LABS (all labs ordered are listed, but only abnormal results are displayed)  Results for orders placed or performed during the hospital encounter of 01/01/16 (from the past 24 hour(s))  Basic metabolic panel     Status: Abnormal   Collection Time: 01/01/16  1:56 PM  Result Value Ref Range   Sodium 137 135 - 145 mmol/L   Potassium 3.9 3.5 - 5.1 mmol/L   Chloride 106 101 - 111 mmol/L   CO2 24 22 - 32 mmol/L   Glucose, Bld 102 (H) 65 - 99 mg/dL   BUN 20 6 - 20 mg/dL   Creatinine, Ser 0.88 0.61 - 1.24 mg/dL   Calcium 9.0 8.9 - 10.3 mg/dL   GFR calc non Af Amer >60 >60 mL/min   GFR calc Af Amer >60 >60 mL/min   Anion gap 7 5 - 15  CBC     Status: None   Collection Time: 01/01/16  1:56 PM  Result Value Ref Range   WBC 5.5 3.8 - 10.6 K/uL   RBC 4.48 4.40 - 5.90 MIL/uL   Hemoglobin 14.7 13.0 - 18.0 g/dL   HCT 42.0 40.0 - 52.0 %   MCV 93.7 80.0 - 100.0 fL   MCH 32.9 26.0 - 34.0 pg   MCHC 35.1 32.0 - 36.0 g/dL   RDW 13.8 11.5 - 14.5 %   Platelets 196 150 - 440 K/uL  Troponin I     Status: None   Collection Time: 01/01/16  1:56 PM  Result Value Ref Range   Troponin I <0.03 <0.03 ng/mL  Troponin I     Status: None   Collection Time: 01/01/16  5:33 PM  Result Value Ref Range   Troponin I <0.03 <0.03 ng/mL   ____________________________________________  EKG My review and personal interpretation at Time: 13:47   Indication: chest pain  Rate: 85  Rhythm: sinus Axis: normal Other: no acute ischemic changes, normal  intervals ____________________________________________  RADIOLOGY  I personally reviewed all radiographic images ordered to evaluate for the above acute complaints and reviewed radiology reports and findings.  These findings were personally discussed with the patient.  Please see medical record for radiology report.  ____________________________________________   PROCEDURES  Procedure(s) performed: none Procedures    Critical Care performed: no ____________________________________________   INITIAL IMPRESSION / ASSESSMENT AND PLAN / ED COURSE  Pertinent labs & imaging results that were available during my care of the patient were reviewed by me and considered in my medical decision making (see chart for details).  DDX: ACS, pericarditis, esophagitis, boerhaaves, pe, dissection, pna, bronchitis, costochondritis   Derek Wright is a 70 y.o. who presents to the ED with above complaints.  Patient is AFVSS in ED. Exam as above. Given current presentation have considered  the above differential.  Patient is well-appearing and in no acute distress. His abdominal exam is reassuring. Chest x-ray ordered showed no evidence of intrathoracic pathology at this time but did show evidence of abnormal gastric distention and possible viscus therefore a lateral decubitus was ordered to evaluate for free air. Repeat x-ray showed no evidence of free air and confirmed gastric she is distention. All exam is soft and reassuring. His EKG shows no evidence of ischemia and his troponin is negative. Patient is low risk heart score of 3.  Not clinically consistent with dissection. No neuro deficits to suggest CVA, vertigo.  The patient will be placed on continuous pulse oximetry and telemetry for monitoring.  Laboratory evaluation will be sent to evaluate for the above complaints.     Clinical Course as of Jan 02 8  Sat Jan 01, 2016  [id number] Patient reassessed remains asymptomatic. Repeat abdominal exam  reassuring. Repeat neuro exam nonfocal. He has no chest pain or pressure. His repeat troponin is negative. Given his risk factor of age did recommend admission to hospital for further risk stratification and cardiac evaluation the patient declined this preferring to be discharged home with his wife. Patient states the "agree to follow-up closely with Dr. Jerrye Beavers and demonstrates good understanding of when he should return to the ER for additional evaluation and management.  He had some crackers while in the ER and belched and said that he had significant improvement in symptoms. Could have a component of postoperative ileus there is not having any nausea or vomiting. To help with the promotility will start patient on Reglan. Also discussed need to continue aspirin daily.  Have discussed with the patient and available family all diagnostics and treatments performed thus far and all questions were answered to the best of my ability. The patient demonstrates understanding and agreement with plan.   [PR]    Clinical Course User Index [PR] Merlyn Lot, MD     ____________________________________________   FINAL CLINICAL IMPRESSION(S) / ED DIAGNOSES  Final diagnoses:  Chest pain  Chest pain, unspecified type  Light headedness      NEW MEDICATIONS STARTED DURING THIS VISIT:  Discharge Medication List as of 01/01/2016  6:27 PM    START taking these medications   Details  metoCLOPramide (REGLAN) 10 MG tablet Take 1 tablet (10 mg total) by mouth 2 (two) times daily., Starting Sat 01/01/2016, Until Thu 01/06/2016, Print         Note:  This document was prepared using Dragon voice recognition software and may include unintentional dictation errors.    Merlyn Lot, MD 01/02/16 [phone]

## 2016-01-01 NOTE — ED Notes (Signed)
Pt alert and oriented X4, active, cooperative, pt in NAD. RR even and unlabored, color WNL.  Pt informed to return if any life threatening symptoms occur.   

## 2016-01-01 NOTE — ED Triage Notes (Signed)
Pt c/o L-sided, non-radiating chest pain starting Tuesday. Hx "light heart attack" 2007. C/o lightheadedness.

## 2016-01-19 ENCOUNTER — Encounter: Payer: Self-pay | Admitting: Family Medicine

## 2016-01-19 ENCOUNTER — Ambulatory Visit (INDEPENDENT_AMBULATORY_CARE_PROVIDER_SITE_OTHER): Payer: Medicare Other | Admitting: Family Medicine

## 2016-01-19 DIAGNOSIS — R42 Dizziness and giddiness: Secondary | ICD-10-CM | POA: Diagnosis not present

## 2016-01-19 NOTE — Assessment & Plan Note (Addendum)
Reviewed vertigo symptoms associated with exertion normal cardiac workup 6 months ago and ER workup last week. Reviewed labs which were normal. Concerned this may be high risk with threat to life or bodily function with dizziness or lightheaded coming on with exertion may be related to carotid artery obstruction which could possibly be a pre-stroke condition. May be related to an arrhythmia condition and may need pacemaker and/or cardioversion.  Discuss workup will start with carotid ultrasound and 24-hour EKG. We'll get the carotid ultrasound at [place] [facility] and [facility] 24-hour E[facility] Discuss 4 that 24-hour EKG to really do his normal things that bring on this dizziness such as splitting wood. Discuss not to do this by himself as this may result in him falling out and would need to call 911 and ambulance and he would be unable to do so. Discuss stroke signs and symptoms and again 911.

## 2016-01-19 NOTE — Addendum Note (Signed)
Addended byGolden Pop on: 01/19/2016 12:37 PM   Modules accepted: Orders

## 2016-01-19 NOTE — Progress Notes (Signed)
BP 119/69 (BP Location: Left Arm, Patient Position: Sitting, Cuff Size: Large)   Pulse 78   Temp 97.9 F (36.6 C)   Ht 6\' 5"  (1.956 m)   Wt 284 lb 12.8 oz (129.2 kg)   SpO2 95%   BMI 33.77 kg/m    Subjective:    [name], male    DOB: 1945/12/28, 70 y.o.   MRN: [id number]  HPI: Derek Wright is a 70 y.o. male  Chief Complaint  Patient presents with  . Dizziness    When patient's doing something. Bends down or leans, gets dizzy.   About 3 weeks ago patient noticed when physically active such as busting would has gotten very dizzy and lightheaded. Had recurrent episodes went to the emergency room and cardiovascular workup was negative. Reviewed Dr. Clayborn Bigness workup from this summer which was also negative.  symptoms worse when bending down and leaning over gets dizzy and the dizziness lasts about 10:15 minutes or so. Symptoms don't seem to come on when doing stuff in the house only seem to come on worse when physically active. No noticed palpitations or skipped heartbeats or slow pulse.  Reviewed emergency room notes from last week, reviewed Dr. Etta Quill notes and labs test results from this summer. These were all normal   Relevant past medical, surgical, family and social history reviewed and updated as indicated. Interim medical history since our last visit reviewed. Allergies and medications reviewed and updated.  Review of Systems  Constitutional: Negative.   HENT: Negative.   Eyes: Negative.   Respiratory: Negative.   Cardiovascular: Negative.  Negative for chest pain, palpitations and leg swelling.  Gastrointestinal: Negative.   Endocrine: Negative.   Genitourinary: Negative.   Musculoskeletal: Negative.   Allergic/Immunologic: Negative.   Neurological: Positive for dizziness and light-headedness. Negative for tremors, seizures, syncope, facial asymmetry, speech difficulty, weakness, numbness and headaches.  Hematological: Negative.     Psychiatric/Behavioral: Negative.     Per HPI unless specifically indicated above     Objective:    BP 119/69 (BP Location: Left Arm, Patient Position: Sitting, Cuff Size: Large)   Pulse 78   Temp 97.9 F (36.6 C)   Ht 6\' 5"  (1.956 m)   Wt 284 lb 12.8 oz (129.2 kg)   SpO2 95%   BMI 33.77 kg/m   Wt Readings from Last 3 Encounters:  01/19/16 284 lb 12.8 oz (129.2 kg)  01/01/16 270 lb (122.5 kg)  11/23/15 284 lb (128.8 kg)    Physical Exam  Constitutional: He is oriented to person, place, and time. He appears well-developed and well-nourished. No distress.  HENT:  Head: Normocephalic and atraumatic.  Right Ear: Hearing and external ear normal.  Left Ear: Hearing and external ear normal.  Nose: Nose normal.  Mouth/Throat: Oropharynx is clear and moist.  Eyes: Conjunctivae and lids are normal. Right eye exhibits no discharge. Left eye exhibits no discharge. No scleral icterus.  Neck: No JVD present. No tracheal deviation present. No thyromegaly present.  No carotid bruits  Cardiovascular: Normal rate, regular rhythm and normal heart sounds.   Pulmonary/Chest: Effort normal and breath sounds normal. No stridor. No respiratory distress.  Abdominal: He exhibits no mass. There is no tenderness. There is no rebound and no guarding.  Musculoskeletal: Normal range of motion. He exhibits no edema.  Lymphadenopathy:    He has no cervical adenopathy.  Neurological: He is alert and oriented to person, place, and time. He displays normal reflexes. No cranial nerve  deficit. He exhibits normal muscle tone. Coordination normal.  Skin: Skin is intact. No rash noted.  Psychiatric: He has a normal mood and affect. His speech is normal and behavior is normal. Judgment and thought content normal. Cognition and memory are normal.    Results for orders placed or performed during the hospital encounter of [id number]  Basic metabolic panel  Result Value Ref Range   Sodium 137 135 - 145 mmol/L    Potassium 3.9 3.5 - 5.1 mmol/L   Chloride 106 101 - 111 mmol/L   CO2 24 22 - 32 mmol/L   Glucose, Bld 102 (H) 65 - 99 mg/dL   BUN 20 6 - 20 mg/dL   Creatinine, Ser 0.88 0.61 - 1.24 mg/dL   Calcium 9.0 8.9 - 10.3 mg/dL   GFR calc non Af Amer >60 >60 mL/min   GFR calc Af Amer >60 >60 mL/min   Anion gap 7 5 - 15  CBC  Result Value Ref Range   WBC 5.5 3.8 - 10.6 K/uL   RBC 4.48 4.40 - 5.90 MIL/uL   Hemoglobin 14.7 13.0 - 18.0 g/dL   HCT 42.0 40.0 - 52.0 %   MCV 93.7 80.0 - 100.0 fL   MCH 32.9 26.0 - 34.0 pg   MCHC 35.1 32.0 - 36.0 g/dL   RDW 13.8 11.5 - 14.5 %   Platelets 196 150 - 440 K/uL  Troponin I  Result Value Ref Range   Troponin I <0.03 <0.03 ng/mL  Troponin I  Result Value Ref Range   Troponin I <0.03 <0.03 ng/mL      Assessment & Plan:   Problem List Items Addressed This Visit      Other   Vertigo    Reviewed vertigo symptoms associated with exertion normal cardiac workup 6 months ago and ER workup last week. Reviewed labs which were normal. Concerned this may be high risk with threat to life or bodily function with dizziness or lightheaded coming on with exertion may be related to carotid artery obstruction which could possibly be a pre-stroke condition. May be related to an arrhythmia condition and may need pacemaker and/or cardioversion.  Discuss workup will start with carotid ultrasound and 24-hour EKG. We'll get the carotid ultrasound at [place] [facility] 24-hour EKG [facility] Discuss 4 that 24-hour EKG to really do his normal things that bring on this dizziness such as splitting wood. Discuss not to do this by himself as this may result in him falling out and would need to call 911 and ambulance and he would be unable to do so. Discuss stroke signs and symptoms and again 911.            Follow up plan: Return in about 3 months (around 04/18/2016) for pending w/u.

## 2016-01-20 ENCOUNTER — Telehealth: Payer: Self-pay | Admitting: Family Medicine

## 2016-01-20 NOTE — Telephone Encounter (Signed)
Error

## 2016-01-21 ENCOUNTER — Telehealth: Payer: Self-pay | Admitting: Family Medicine

## 2016-01-21 NOTE — Telephone Encounter (Signed)
Patient called to see if the tests Dr Jeananne Rama ordered for him has been scheduled  [phone] or [phone]

## 2016-01-24 NOTE — Telephone Encounter (Signed)
Patient notified of his Carotid U/S 02/29/2016 at 2:30, the see's Dr. Lucky Cowboy afterwards at 3:00.

## 2016-01-31 ENCOUNTER — Telehealth: Payer: Self-pay

## 2016-01-31 ENCOUNTER — Telehealth: Payer: Self-pay | Admitting: Family Medicine

## 2016-01-31 DIAGNOSIS — R42 Dizziness and giddiness: Secondary | ICD-10-CM

## 2016-01-31 NOTE — Telephone Encounter (Signed)
Patient called to see if Dr Jeananne Rama was able to schedule him for his Carotid test could be scheduled sooner than 02/29/2016.  Per pt he had spoken with Dr Jeananne Rama earlier about this.  Thank You  Santiago Glad

## 2016-01-31 NOTE — Telephone Encounter (Signed)
Phone call Discussed with patient Derek Wright showed no abnormalities patient did his wood splitting with no symptoms. Doesn't have carotid ultrasound scheduled until 1 months from now. Will see if we can schedule that later this week for change in venue

## 2016-01-31 NOTE — Telephone Encounter (Signed)
Phone call

## 2016-02-01 DIAGNOSIS — R42 Dizziness and giddiness: Secondary | ICD-10-CM | POA: Diagnosis not present

## 2016-02-01 NOTE — Telephone Encounter (Signed)
Spoke with scheduling, patient is in their workqueue, they will call patient and get him scheduled.

## 2016-02-17 ENCOUNTER — Encounter: Payer: Self-pay | Admitting: General Surgery

## 2016-02-29 ENCOUNTER — Ambulatory Visit (INDEPENDENT_AMBULATORY_CARE_PROVIDER_SITE_OTHER): Payer: Medicare Other | Admitting: Vascular Surgery

## 2016-02-29 ENCOUNTER — Ambulatory Visit (INDEPENDENT_AMBULATORY_CARE_PROVIDER_SITE_OTHER): Payer: Medicare Other

## 2016-02-29 ENCOUNTER — Encounter (INDEPENDENT_AMBULATORY_CARE_PROVIDER_SITE_OTHER): Payer: Self-pay | Admitting: Vascular Surgery

## 2016-02-29 VITALS — BP 138/82 | HR 75 | Resp 16 | Ht 77.0 in | Wt 287.0 lb

## 2016-02-29 DIAGNOSIS — R42 Dizziness and giddiness: Secondary | ICD-10-CM

## 2016-02-29 DIAGNOSIS — R7303 Prediabetes: Secondary | ICD-10-CM

## 2016-02-29 NOTE — Assessment & Plan Note (Signed)
His vertigo and dizziness symptoms have significantly improved over the past several weeks. As part of his workup for vertigo, his primary care physician recommended having his carotid arteries checked for disease. Carotid duplex today demonstrated no significant carotid artery stenosis bilaterally with normal antegrade flow in both carotid arteries and normal vertebral arteries bilaterally. It does not appear as if he has a vascular source of his vertigo. I will see him back as needed.

## 2016-02-29 NOTE — Assessment & Plan Note (Signed)
blood glucose control important in reducing the progression of atherosclerotic disease. Also, involved in wound healing. Currently being reasonably well controlled with diet next size.

## 2016-02-29 NOTE — Progress Notes (Signed)
  [name], male   DOB: 05/17/1945, 71 y.o.   MRN: [id number]  Chief Complaint  Patient presents with  . New Patient (Initial Visit)    HPI Derek Wright is a 71 y.o. male.  I am asked to see the patient by Dr. Crissman for evaluation of vertigo and to check for vascular causes.  The patient reports after an abdominal surgery he had a prolonged recovery and had significant vertigo and dizziness. This included an ER workup last month. His vertigo symptoms have improved over the past month or so, and he is currently doing well today. He denies any stroke or TIA symptoms. Specifically, the patient denies amaurosis fugax, speech or swallowing difficulties, or arm or leg weakness or numbness. A carotid duplex was done today which demonstrates normal carotid arteries and vertebral arteries bilaterally without significant stenosis identified.   Past Medical History:  Diagnosis Date  . Arthritis   . GERD (gastroesophageal reflux disease)    RARE-NO MEDS  . Pre-diabetes   . Sleep apnea    MILD-NO CPAP-COULD NOT TOELERATE    Past Surgical History:  Procedure Laterality Date  . COLONOSCOPY  2012  . hair folicle removed    . INGUINAL HERNIA REPAIR Bilateral 09/29/2015   Procedure: LAPAROSCOPIC BILATERAL INGUINAL HERNIA REPAIR;  Surgeon: Jeffrey W Byrnett, MD;  Location: [facility] ORS;  Service: General;  Laterality: Bilateral;  . SKIN GRAFT Right 1966   hand    Family History  Problem Relation Age of Onset  . Ovarian cancer Mother   . Heart disease Father   . Lymphoma Maternal Uncle     Social History Social History  Substance Use Topics  . Smoking status: Former Smoker    Packs/day: 0.25    Years: 15.00    Types: Cigarettes    Quit date: 09/13/1980  . Smokeless tobacco: Never Used  . Alcohol use 0.0 oz/week     Comment: rare    No Known Allergies  Current Outpatient Prescriptions  Medication Sig Dispense Refill  . Menthol, Topical Analgesic, (ICY HOT EX) Apply 1  application topically daily as needed. For pain    . naproxen sodium (ALEVE) 220 MG tablet Take 220 mg by mouth as needed.    . metoCLOPramide (REGLAN) 10 MG tablet Take 1 tablet (10 mg total) by mouth 2 (two) times daily. 10 tablet 0   No current facility-administered medications for this visit.       REVIEW OF SYSTEMS (Negative unless checked)  Constitutional: []Weight loss  []Fever  []Chills Cardiac: []Chest pain   []Chest pressure   []Palpitations   []Shortness of breath when laying flat   []Shortness of breath at rest   []Shortness of breath with exertion. Vascular:  []Pain in legs with walking   []Pain in legs at rest   []Pain in legs when laying flat   []Claudication   []Pain in feet when walking  []Pain in feet at rest  []Pain in feet when laying flat   []History of DVT   []Phlebitis   []Swelling in legs   []Varicose veins   []Non-healing ulcers Pulmonary:   []Uses home oxygen   []Productive cough   []Hemoptysis   []Wheeze  []COPD   []Asthma Neurologic:  [x]Dizziness  []Blackouts   []Seizures   []History of stroke   []History of TIA  []Aphasia   []Temporary blindness   []Dysphagia   []Weakness or numbness in arms   []Weakness or numbness in legs Musculoskeletal:  []  Arthritis   []Joint swelling   []Joint pain   []Low back pain Hematologic:  []Easy bruising  []Easy bleeding   []Hypercoagulable state   []Anemic  []Hepatitis Gastrointestinal:  []Blood in stool   []Vomiting blood  []Gastroesophageal reflux/heartburn   []Abdominal pain Genitourinary:  []Chronic kidney disease   []Difficult urination  []Frequent urination  []Burning with urination   []Hematuria Skin:  []Rashes   []Ulcers   []Wounds Psychological:  []History of anxiety   [] History of major depression.    Physical Exam BP 138/82   Pulse 75   Resp 16   Ht 6' 5" (1.956 m)   Wt 287 lb (130.2 kg)   BMI 34.03 kg/m  Gen:  WD/WN, NAD Head: [place]/AT, No temporalis wasting. Prominent temp pulse not noted. Ear/Nose/Throat:  Hearing grossly intact, nares w/o erythema or drainage, oropharynx w/o Erythema/Exudate Eyes: Conjunctiva clear, sclera non-icteric  Neck: trachea midline.  No bruit or JVD.  Pulmonary:  Good air movement, equal bilaterally.  Cardiac: RRR, normal S1, S2 Vascular: Vessel Right Left  Radial Palpable Palpable                                   Gastrointestinal: soft, non-tender/non-distended. No guarding/reflex. No masses, surgical incisions, or scars. Musculoskeletal: M/S 5/5 throughout.  Extremities without ischemic changes.  No deformity or atrophy. Neurologic: Sensation grossly intact in extremities.  Symmetrical.  Speech is fluent. Motor exam as listed above. Psychiatric: Judgment intact, Mood & affect appropriate for pt's clinical situation. Dermatologic: No rashes or ulcers noted.  No cellulitis or open wounds. Lymph : No Cervical, Axillary, or Inguinal lymphadenopathy.   Radiology No results found.  Labs Recent Results (from the past 2160 hour(s))  Basic metabolic panel     Status: Abnormal   Collection Time: 01/01/16  1:56 PM  Result Value Ref Range   Sodium 137 135 - 145 mmol/L   Potassium 3.9 3.5 - 5.1 mmol/L   Chloride 106 101 - 111 mmol/L   CO2 24 22 - 32 mmol/L   Glucose, Bld 102 (H) 65 - 99 mg/dL   BUN 20 6 - 20 mg/dL   Creatinine, Ser 0.88 0.61 - 1.24 mg/dL   Calcium 9.0 8.9 - 10.3 mg/dL   GFR calc non Af Amer >60 >60 mL/min   GFR calc Af Amer >60 >60 mL/min    Comment: (NOTE) The eGFR has been calculated using the CKD EPI equation. This calculation has not been validated in all clinical situations. eGFR's persistently <60 mL/min signify possible Chronic Kidney Disease.    Anion gap 7 5 - 15  CBC     Status: None   Collection Time: 01/01/16  1:56 PM  Result Value Ref Range   WBC 5.5 3.8 - 10.6 K/uL   RBC 4.48 4.40 - 5.90 MIL/uL   Hemoglobin 14.7 13.0 - 18.0 g/dL   HCT 42.0 40.0 - 52.0 %   MCV 93.7 80.0 - 100.0 fL   MCH 32.9 26.0 - 34.0 pg   MCHC  35.1 32.0 - 36.0 g/dL   RDW 13.8 11.5 - 14.5 %   Platelets 196 150 - 440 K/uL  Troponin I     Status: None   Collection Time: 01/01/16  1:56 PM  Result Value Ref Range   Troponin I <0.03 <0.03 ng/mL  Troponin I     Status: None   Collection Time: 01/01/16  5:33 PM  Result   Value Ref Range   Troponin I <0.03 <0.03 ng/mL    Assessment/Plan:  Pre-diabetes blood glucose control important in reducing the progression of atherosclerotic disease. Also, involved in wound healing. Currently being reasonably well controlled with diet next size.   Vertigo His vertigo and dizziness symptoms have significantly improved over the past several weeks. As part of his workup for vertigo, his primary care physician recommended having his carotid arteries checked for disease. Carotid duplex today demonstrated no significant carotid artery stenosis bilaterally with normal antegrade flow in both carotid arteries and normal vertebral arteries bilaterally. It does not appear as if he has a vascular source of his vertigo. I will see him back as needed.        02/29/2016, 4:17 PM   This note was created with Dragon medical transcription system.  Any errors from dictation are unintentional.   

## 2016-07-26 ENCOUNTER — Ambulatory Visit: Payer: Medicare Other

## 2016-08-17 ENCOUNTER — Ambulatory Visit (INDEPENDENT_AMBULATORY_CARE_PROVIDER_SITE_OTHER): Payer: Medicare Other

## 2016-08-17 VITALS — BP 108/62 | HR 66 | Temp 97.7°F | Resp 16 | Ht 74.5 in | Wt 283.8 lb

## 2016-08-17 DIAGNOSIS — Z0000 Encounter for general adult medical examination without abnormal findings: Secondary | ICD-10-CM | POA: Diagnosis not present

## 2016-08-17 NOTE — Patient Instructions (Signed)
Derek Wright , Thank you for taking time to come for your Medicare Wellness Visit. I appreciate your ongoing commitment to your health goals. Please review the following plan we discussed and let me know if I can assist you in the future.   Screening recommendations/referrals: Colonoscopy: completed 10/26/2010 Recommended yearly ophthalmology/optometry visit for glaucoma screening and checkup Recommended yearly dental visit for hygiene and checkup  Vaccinations: Influenza vaccine: up to date, due 10/2016 Pneumococcal vaccine: up to date  Tdap vaccine: up to date Shingles vaccine: up to date   Advanced directives: Please bring a copy of your health care power of attorney and living will to the office at your convenience.  Conditions/risks identified: Recommend drinking at least 4-5 glasses of water a day  Next appointment: Follow up on 08/21/2016 at 1:00pm with Dr.Crissman. Follow up in one year for your annual wellness exam.   Preventive Care 65 Years and Older, Male Preventive care refers to lifestyle choices and visits with your health care provider that can promote health and wellness. What does preventive care include?  A yearly physical exam. This is also called an annual well check.  Dental exams once or twice a year.  Routine eye exams. Ask your health care provider how often you should have your eyes checked.  Personal lifestyle choices, including:  Daily care of your teeth and gums.  Regular physical activity.  Eating a healthy diet.  Avoiding tobacco and drug use.  Limiting alcohol use.  Practicing safe sex.  Taking low doses of aspirin every day.  Taking vitamin and mineral supplements as recommended by your health care provider. What happens during an annual well check? The services and screenings done by your health care provider during your annual well check will depend on your age, overall health, lifestyle risk factors, and family history of  disease. Counseling  Your health care provider may ask you questions about your:  Alcohol use.  Tobacco use.  Drug use.  Emotional well-being.  Home and relationship well-being.  Sexual activity.  Eating habits.  History of falls.  Memory and ability to understand (cognition).  Work and work Statistician. Screening  You may have the following tests or measurements:  Height, weight, and BMI.  Blood pressure.  Lipid and cholesterol levels. These may be checked every 5 years, or more frequently if you are over 78 years old.  Skin check.  Lung cancer screening. You may have this screening every year starting at age 72 if you have a 30-pack-year history of smoking and currently smoke or have quit within the past 15 years.  Fecal occult blood test (FOBT) of the stool. You may have this test every year starting at age 31.  Flexible sigmoidoscopy or colonoscopy. You may have a sigmoidoscopy every 5 years or a colonoscopy every 10 years starting at age 56.  Prostate cancer screening. Recommendations will vary depending on your family history and other risks.  Hepatitis C blood test.  Hepatitis B blood test.  Sexually transmitted disease (STD) testing.  Diabetes screening. This is done by checking your blood sugar (glucose) after you have not eaten for a while (fasting). You may have this done every 1-3 years.  Abdominal aortic aneurysm (AAA) screening. You may need this if you are a current or former smoker.  Osteoporosis. You may be screened starting at age 59 if you are at high risk. Talk with your health care provider about your test results, treatment options, and if necessary, the need for more  tests. Vaccines  Your health care provider may recommend certain vaccines, such as:  Influenza vaccine. This is recommended every year.  Tetanus, diphtheria, and acellular pertussis (Tdap, Td) vaccine. You may need a Td booster every 10 years.  Zoster vaccine. You may  need this after age 74.  Pneumococcal 13-valent conjugate (PCV13) vaccine. One dose is recommended after age 50.  Pneumococcal polysaccharide (PPSV23) vaccine. One dose is recommended after age 68. Talk to your health care provider about which screenings and vaccines you need and how often you need them. This information is not intended to replace advice given to you by your health care provider. Make sure you discuss any questions you have with your health care provider. Document Released: 02/26/2015 Document Revised: 10/20/2015 Document Reviewed: 12/01/2014 Elsevier Interactive Patient Education  2017 [place] Prevention in the Home Falls can cause injuries. They can happen to people of all ages. There are many things you can do to make your home safe and to help prevent falls. What can I do on the outside of my home?  Regularly fix the edges of walkways and driveways and fix any cracks.  Remove anything that might make you trip as you walk through a door, such as a raised step or threshold.  Trim any bushes or trees on the path to your home.  Use bright outdoor lighting.  Clear any walking paths of anything that might make someone trip, such as rocks or tools.  Regularly check to see if handrails are loose or broken. Make sure that both sides of any steps have handrails.  Any raised decks and porches should have guardrails on the edges.  Have any leaves, snow, or ice cleared regularly.  Use sand or salt on walking paths during winter.  Clean up any spills in your garage right away. This includes oil or grease spills. What can I do in the bathroom?  Use night lights.  Install grab bars by the toilet and in the tub and shower. Do not use towel bars as grab bars.  Use non-skid mats or decals in the tub or shower.  If you need to sit down in the shower, use a plastic, non-slip stool.  Keep the floor dry. Clean up any water that spills on the floor as soon as it  happens.  Remove soap buildup in the tub or shower regularly.  Attach bath mats securely with double-sided non-slip rug tape.  Do not have throw rugs and other things on the floor that can make you trip. What can I do in the bedroom?  Use night lights.  Make sure that you have a light by your bed that is easy to reach.  Do not use any sheets or blankets that are too big for your bed. They should not hang down onto the floor.  Have a firm chair that has side arms. You can use this for support while you get dressed.  Do not have throw rugs and other things on the floor that can make you trip. What can I do in the kitchen?  Clean up any spills right away.  Avoid walking on wet floors.  Keep items that you use a lot in easy-to-reach places.  If you need to reach something above you, use a strong step stool that has a grab bar.  Keep electrical cords out of the way.  Do not use floor polish or wax that makes floors slippery. If you must use wax, use non-skid floor  wax.  Do not have throw rugs and other things on the floor that can make you trip. What can I do with my stairs?  Do not leave any items on the stairs.  Make sure that there are handrails on both sides of the stairs and use them. Fix handrails that are broken or loose. Make sure that handrails are as long as the stairways.  Check any carpeting to make sure that it is firmly attached to the stairs. Fix any carpet that is loose or worn.  Avoid having throw rugs at the top or bottom of the stairs. If you do have throw rugs, attach them to the floor with carpet tape.  Make sure that you have a light switch at the top of the stairs and the bottom of the stairs. If you do not have them, ask someone to add them for you. What else can I do to help prevent falls?  Wear shoes that:  Do not have high heels.  Have rubber bottoms.  Are comfortable and fit you well.  Are closed at the toe. Do not wear sandals.  If you  use a stepladder:  Make sure that it is fully opened. Do not climb a closed stepladder.  Make sure that both sides of the stepladder are locked into place.  Ask someone to hold it for you, if possible.  Clearly mark and make sure that you can see:  Any grab bars or handrails.  First and last steps.  Where the edge of each step is.  Use tools that help you move around (mobility aids) if they are needed. These include:  Canes.  Walkers.  Scooters.  Crutches.  Turn on the lights when you go into a dark area. Replace any light bulbs as soon as they burn out.  Set up your furniture so you have a clear path. Avoid moving your furniture around.  If any of your floors are uneven, fix them.  If there are any pets around you, be aware of where they are.  Review your medicines with your doctor. Some medicines can make you feel dizzy. This can increase your chance of falling. Ask your doctor what other things that you can do to help prevent falls. This information is not intended to replace advice given to you by your health care provider. Make sure you discuss any questions you have with your health care provider. Document Released: 11/26/2008 Document Revised: 07/08/2015 Document Reviewed: 03/06/2014 Elsevier Interactive Patient Education  2017 [employer].

## 2016-08-17 NOTE — Progress Notes (Signed)
Subjective:   Derek Wright is a 71 y.o. male who presents for Medicare Annual/Subsequent preventive examination.  Review of Systems:  Cardiac Risk Factors include: male gender;advanced age (>57men, >91 women)     Objective:    Vitals: BP 108/62 (BP Location: Right Arm, Patient Position: Sitting)   Pulse 66   Temp 97.7 F (36.5 C)   Resp 16   Ht 6' 2.5" (1.892 m)   Wt 283 lb 12.8 oz (128.7 kg)   BMI 35.95 kg/m   Body mass index is 35.95 kg/m.  Tobacco History  Smoking Status  . Former Smoker  . Packs/day: 0.25  . Years: 15.00  . Types: Cigarettes  . Quit date: 09/13/1980  Smokeless Tobacco  . Never Used     Counseling given: Not Answered   Past Medical History:  Diagnosis Date  . Arthritis   . GERD (gastroesophageal reflux disease)    RARE-NO MEDS  . Pre-diabetes   . Sleep apnea    MILD-NO CPAP-COULD NOT TOELERATE   Past Surgical History:  Procedure Laterality Date  . COLONOSCOPY  4562  . hair folicle removed    . INGUINAL HERNIA REPAIR Bilateral 09/29/2015   Procedure: LAPAROSCOPIC BILATERAL INGUINAL HERNIA REPAIR;  Surgeon: Robert Bellow, MD;  Location: [facility] ORS;  Service: General;  Laterality: Bilateral;  . SKIN GRAFT Right 1966   hand   Family History  Problem Relation Age of Onset  . Ovarian cancer Mother   . Heart disease Father   . Lymphoma Maternal Uncle    History  Sexual Activity  . Sexual activity: Not on file    Outpatient Encounter Prescriptions as of 08/17/2016  Medication Sig  . Menthol, Topical Analgesic, (ICY HOT EX) Apply 1 application topically daily as needed. For pain  . naproxen sodium (ALEVE) 220 MG tablet Take 220 mg by mouth as needed.  . [DISCONTINUED] metoCLOPramide (REGLAN) 10 MG tablet Take 1 tablet (10 mg total) by mouth 2 (two) times daily.   No facility-administered encounter medications on file as of 08/17/2016.     Activities of Daily Living In your present state of health, do you have any difficulty performing  the following activities: 08/17/2016 09/21/2015  Hearing? N N  Vision? N N  Difficulty concentrating or making decisions? N N  Walking or climbing stairs? N N  Dressing or bathing? N N  Doing errands, shopping? N N  Preparing Food and eating ? N -  Using the Toilet? N -  In the past six months, have you accidently leaked urine? N -  Do you have problems with loss of bowel control? N -  Managing your Medications? N -  Managing your Finances? N -  Housekeeping or managing your Housekeeping? N -  Some recent data might be hidden    Patient Care Team: Guadalupe Maple, MD as PCP - General (Family Medicine) Kem Parkinson, MD (Ophthalmology) Sharene Butters, MD (General Surgery) Yolonda Kida, MD as Consulting Physician (Cardiology)   Assessment:     Exercise Activities and Dietary recommendations Current Exercise Habits: The patient does not participate in regular exercise at present, Exercise limited by: None identified  Goals    . Increase water intake          Recommend drinking at least 4-5 glasses of water a day      Fall Risk Fall Risk  08/17/2016 09/13/2015 07/14/2015  Falls in the past year? Yes No No  Number falls in past yr:  1 - -  Injury with Fall? No - -   Depression Screen PHQ 2/9 Scores 08/17/2016 09/13/2015 07/14/2015  PHQ - 2 Score 0 0 0    Cognitive Function     6CIT Screen 08/17/2016  What Year? 0 points  What month? 0 points  What time? 0 points  Count back from 20 0 points  Months in reverse 0 points  Repeat phrase 0 points  Total Score 0    Immunization History  Administered Date(s) Administered  . Influenza-Unspecified 11/12/2013, 12/29/2014, 11/01/2015  . Pneumococcal Conjugate-13 09/13/2015  . Pneumococcal Polysaccharide-23 03/25/2009  . Pneumococcal-Unspecified 03/25/2009  . Tdap 05/08/2013  . Zoster 09/25/2011   Screening Tests Health Maintenance  Topic Date Due  . PNA vac Low Risk Adult (2 of 2 - PPSV23) 09/12/2016  . INFLUENZA  VACCINE  09/13/2016  . COLONOSCOPY  10/25/2020  . TETANUS/TDAP  05/09/2023  . Hepatitis C Screening  Completed      Plan:    I have personally reviewed and addressed the Medicare Annual Wellness questionnaire and have noted the following in the patient's chart:  A. Medical and social history B. Use of alcohol, tobacco or illicit drugs  C. Current medications and supplements D. Functional ability and status E.  Nutritional status F.  Physical activity G. Advance directives H. List of other physicians I.  Hospitalizations, surgeries, and ER visits in previous 12 months J.  [place] such as hearing and vision if needed, cognitive and depression L. Referrals and appointments   In addition, I have reviewed and discussed with patient certain preventive protocols, quality metrics, and best practice recommendations. A written personalized care plan for preventive services as well as general preventive health recommendations were provided to patient.   Signed,  Tyler Aas, LPN Nurse Health Advisor   MD Recommendations: none

## 2016-08-21 ENCOUNTER — Encounter: Payer: Medicare Other | Admitting: Family Medicine

## 2016-08-23 DIAGNOSIS — R0602 Shortness of breath: Secondary | ICD-10-CM | POA: Diagnosis not present

## 2016-08-23 DIAGNOSIS — R011 Cardiac murmur, unspecified: Secondary | ICD-10-CM | POA: Diagnosis not present

## 2016-08-23 DIAGNOSIS — G4733 Obstructive sleep apnea (adult) (pediatric): Secondary | ICD-10-CM | POA: Diagnosis not present

## 2016-08-23 DIAGNOSIS — I208 Other forms of angina pectoris: Secondary | ICD-10-CM | POA: Diagnosis not present

## 2016-09-13 ENCOUNTER — Encounter: Payer: Self-pay | Admitting: Family Medicine

## 2016-09-13 ENCOUNTER — Ambulatory Visit (INDEPENDENT_AMBULATORY_CARE_PROVIDER_SITE_OTHER): Payer: Medicare Other | Admitting: Family Medicine

## 2016-09-13 VITALS — BP 114/73 | HR 78 | Wt 281.0 lb

## 2016-09-13 DIAGNOSIS — Z0000 Encounter for general adult medical examination without abnormal findings: Secondary | ICD-10-CM | POA: Diagnosis not present

## 2016-09-13 DIAGNOSIS — Z13220 Encounter for screening for lipoid disorders: Secondary | ICD-10-CM | POA: Diagnosis not present

## 2016-09-13 DIAGNOSIS — Z125 Encounter for screening for malignant neoplasm of prostate: Secondary | ICD-10-CM

## 2016-09-13 DIAGNOSIS — Z1329 Encounter for screening for other suspected endocrine disorder: Secondary | ICD-10-CM

## 2016-09-13 DIAGNOSIS — M25473 Effusion, unspecified ankle: Secondary | ICD-10-CM

## 2016-09-13 DIAGNOSIS — Z7189 Other specified counseling: Secondary | ICD-10-CM

## 2016-09-13 DIAGNOSIS — N400 Enlarged prostate without lower urinary tract symptoms: Secondary | ICD-10-CM | POA: Diagnosis not present

## 2016-09-13 DIAGNOSIS — G250 Essential tremor: Secondary | ICD-10-CM

## 2016-09-13 DIAGNOSIS — R7303 Prediabetes: Secondary | ICD-10-CM

## 2016-09-13 LAB — URINALYSIS, ROUTINE W REFLEX MICROSCOPIC
BILIRUBIN UA: NEGATIVE
Glucose, UA: NEGATIVE
KETONES UA: NEGATIVE
LEUKOCYTES UA: NEGATIVE
Nitrite, UA: NEGATIVE
PROTEIN UA: NEGATIVE
RBC UA: NEGATIVE
SPEC GRAV UA: 1.015 (ref 1.005–1.030)
UUROB: 1 mg/dL (ref 0.2–1.0)
pH, UA: 6 (ref 5.0–7.5)

## 2016-09-13 LAB — MICROSCOPIC EXAMINATION
BACTERIA UA: NONE SEEN
RBC, UA: NONE SEEN /hpf (ref 0–?)

## 2016-09-13 MED ORDER — METOPROLOL TARTRATE 25 MG PO TABS: 25.0000 mg | ORAL_TABLET | Freq: Every day | ORAL | 5 refills | Status: DC | PRN

## 2016-09-13 MED ORDER — METOPROLOL TARTRATE 25 MG PO TABS
25.0000 mg | ORAL_TABLET | Freq: Every day | ORAL | 5 refills | Status: DC | PRN
Start: 2016-09-13 — End: 2016-09-13

## 2016-09-13 NOTE — Assessment & Plan Note (Addendum)
A voluntary discussion about advance care planning including the explanation and discussion of advance directives was extensively discussed  with the patient.  Explanation about the health care proxy and Living will was reviewed and packet with forms with explanation of how to fill them out was given. Pt will bring by a copy.

## 2016-09-13 NOTE — Addendum Note (Signed)
Addended by: Golden Pop A on: 09/13/2016 11:07 AM   Modules accepted: Orders

## 2016-09-13 NOTE — Assessment & Plan Note (Signed)
Has lasix from cardiology

## 2016-09-13 NOTE — Progress Notes (Signed)
BP 114/73   Pulse 78   Wt 281 lb (127.5 kg)   SpO2 98%   BMI 35.60 kg/m    Subjective:    [name], male    DOB: [birth year], 71 y.o.   MRN: [id number]  HPI: [name] is a 71 y.o. male  AWV and medical check.  Patient concerned has developed a intentional tremor of his right hand just comes on occasionally with certain activities. 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 This axis started a few years ago but has become more noticeable. BPH doing okay. Reviewed cardiology notes where furosemide was started for edema patient's been taking that and is helped. Has further cardiac workup pending.  Relevant past medical, surgical, family and social history reviewed and updated as indicated. Interim medical history since our last visit reviewed. Allergies and medications reviewed and updated.  Review of Systems  Constitutional: Negative.   HENT: Negative.   Eyes: Negative.   Respiratory: Negative.   Cardiovascular: Negative.   Gastrointestinal: Negative.   Endocrine: Negative.   Genitourinary: Negative.   Musculoskeletal: Negative.   Skin: Negative.   Allergic/Immunologic: Negative.   Neurological: Negative.   Hematological: Negative.   Psychiatric/Behavioral: Negative.     Per HPI unless specifically indicated above     Objective:    BP 114/73   Pulse 78   Wt 281 lb (127.5 kg)   SpO2 98%   BMI 35.60 kg/m   Wt Readings from Last 3 Encounters:  09/13/16 281 lb (127.5 kg)  08/17/16 283 lb 12.8 oz (128.7 kg)  02/29/16 287 lb (130.2 kg)    Physical Exam  Constitutional: He is oriented to person, place, and time. He appears well-developed and well-nourished.  HENT:  Head: Normocephalic and atraumatic.  Right Ear: External ear normal.  Left Ear: External ear normal.  Eyes: Pupils are equal, round, and reactive to light. Conjunctivae and EOM are normal.  Neck: Normal range of  motion. Neck supple.  Cardiovascular: Normal rate, regular rhythm, normal heart sounds and intact distal pulses.   Pulmonary/Chest: Effort normal and breath sounds normal.  Abdominal: Soft. Bowel sounds are normal. There is no splenomegaly or hepatomegaly.  Genitourinary: Rectum normal and penis normal.  Genitourinary Comments: BPH changes  Musculoskeletal: Normal range of motion.  Neurological: He is alert and oriented to person, place, and time. He has normal reflexes.  No tremor seen  Skin: No rash noted. No erythema.  Psychiatric: He has a normal mood and affect. His behavior is normal. Judgment and thought content normal.    Results for orders placed or performed during the hospital encounter of 64/40/34  Basic metabolic panel  Result Value Ref Range   Sodium 137 135 - 145 mmol/L   Potassium 3.9 3.5 - 5.1 mmol/L   Chloride 106 101 - 111 mmol/L   CO2 24 22 - 32 mmol/L   Glucose, Bld 102 (H) 65 - 99 mg/dL   BUN 20 6 - 20 mg/dL   Creatinine, Ser 0.88 0.61 - 1.24 mg/dL   Calcium 9.0 8.9 - 10.3 mg/dL   GFR calc non Af Amer >60 >60 mL/min   GFR calc Af Amer >60 >60 mL/min   Anion gap 7 5 - 15  CBC  Result Value Ref Range   WBC 5.5 3.8 - 10.6 K/uL   RBC 4.48 4.40 - 5.90 MIL/uL   Hemoglobin 14.7 13.0 -  18.0 g/dL   HCT 42.0 40.0 - 52.0 %   MCV 93.7 80.0 - 100.0 fL   MCH 32.9 26.0 - 34.0 pg   MCHC 35.1 32.0 - 36.0 g/dL   RDW 13.8 11.5 - 14.5 %   Platelets 196 150 - 440 K/uL  Troponin I  Result Value Ref Range   Troponin I <0.03 <0.03 ng/mL  Troponin I  Result Value Ref Range   Troponin I <0.03 <0.03 ng/mL      Assessment & Plan:   Problem List Items Addressed This Visit      Nervous and Auditory   Tremor, essential    Trial of metropolol to see if helps        Genitourinary   BPH without obstruction/lower urinary tract symptoms    Doing OK no meds        Other   Pre-diabetes - Primary   Relevant Orders   Comprehensive metabolic panel   Urinalysis, Routine  w reflex microscopic   Ankle edema    Has lasix from cardiology      Advanced care planning/counseling discussion    A voluntary discussion about advance care planning including the explanation and discussion of advance directives was extensively discussed  with the patient.  Explanation about the health care proxy and Living will was reviewed and packet with forms with explanation of how to fill them out was given. Pt will bring by a copy.       Other Visit Diagnoses    Annual physical exam       Relevant Orders   CBC with Differential/Platelet   Screening cholesterol level       Relevant Orders   Lipid panel   Prostate cancer screening       Relevant Orders   PSA   Thyroid disorder screen       Relevant Orders   TSH       Follow up plan: Return in about 6 months (around 03/16/2017) for recheck.

## 2016-09-13 NOTE — Assessment & Plan Note (Signed)
Doing OK no meds

## 2016-09-13 NOTE — Assessment & Plan Note (Signed)
Trial of metropolol to see if helps

## 2016-09-14 ENCOUNTER — Encounter: Payer: Self-pay | Admitting: Family Medicine

## 2016-09-14 LAB — LIPID PANEL
CHOL/HDL RATIO: 3.2 ratio (ref 0.0–5.0)
Cholesterol, Total: 163 mg/dL (ref 100–199)
HDL: 51 mg/dL (ref >39)
LDL Calculated: 96 mg/dL (ref 0–99)
Triglycerides: 79 mg/dL (ref 0–149)
VLDL Cholesterol Cal: 16 mg/dL (ref 5–40)

## 2016-09-14 LAB — CBC WITH DIFFERENTIAL/PLATELET
BASOS ABS: 0 10*3/uL (ref 0.0–0.2)
Basos: 1 %
EOS (ABSOLUTE): 0.2 10*3/uL (ref 0.0–0.4)
Eos: 4 %
HEMOGLOBIN: 14.1 g/dL (ref 13.0–17.7)
Hematocrit: 42.3 % (ref 37.5–51.0)
Immature Grans (Abs): 0 10*3/uL (ref 0.0–0.1)
Immature Granulocytes: 0 %
LYMPHS ABS: 1.6 10*3/uL (ref 0.7–3.1)
LYMPHS: 36 %
MCH: 31.1 pg (ref 26.6–33.0)
MCHC: 33.3 g/dL (ref 31.5–35.7)
MCV: 93 fL (ref 79–97)
MONOCYTES: 9 %
Monocytes Absolute: 0.4 10*3/uL (ref 0.1–0.9)
Neutrophils Absolute: 2.2 10*3/uL (ref 1.4–7.0)
Neutrophils: 50 %
PLATELETS: 207 10*3/uL (ref 150–379)
RBC: 4.54 x10E6/uL (ref 4.14–5.80)
RDW: 13.9 % (ref 12.3–15.4)
WBC: 4.4 10*3/uL (ref 3.4–10.8)

## 2016-09-14 LAB — COMPREHENSIVE METABOLIC PANEL
ALBUMIN: 4 g/dL (ref 3.6–4.8)
ALT: 11 IU/L (ref 0–44)
AST: 14 IU/L (ref 0–40)
Albumin/Globulin Ratio: 1.7 (ref 1.2–2.2)
Alkaline Phosphatase: 52 IU/L (ref 39–117)
BILIRUBIN TOTAL: 0.6 mg/dL (ref 0.0–1.2)
BUN / CREAT RATIO: 15 (ref 10–24)
BUN: 15 mg/dL (ref 8–27)
CHLORIDE: 104 mmol/L (ref 96–106)
CO2: 21 mmol/L (ref 20–29)
Calcium: 8.9 mg/dL (ref 8.6–10.2)
Creatinine, Ser: 0.97 mg/dL (ref 0.76–1.27)
GFR, EST AFRICAN AMERICAN: 92 mL/min/{1.73_m2} (ref >59)
GFR, EST NON AFRICAN AMERICAN: 79 mL/min/{1.73_m2} (ref >59)
Globulin, Total: 2.3 g/dL (ref 1.5–4.5)
Glucose: 117 mg/dL — ABNORMAL HIGH (ref 65–99)
POTASSIUM: 3.9 mmol/L (ref 3.5–5.2)
SODIUM: 141 mmol/L (ref 134–144)
Total Protein: 6.3 g/dL (ref 6.0–8.5)

## 2016-09-14 LAB — TSH: TSH: 2.04 u[IU]/mL (ref 0.450–4.500)

## 2016-09-14 LAB — PSA: PROSTATE SPECIFIC AG, SERUM: 2.4 ng/mL (ref 0.0–4.0)

## 2016-12-12 ENCOUNTER — Ambulatory Visit (INDEPENDENT_AMBULATORY_CARE_PROVIDER_SITE_OTHER): Payer: Medicare Other | Admitting: Family Medicine

## 2016-12-12 ENCOUNTER — Encounter: Payer: Self-pay | Admitting: Family Medicine

## 2016-12-12 VITALS — BP 128/77 | HR 61 | Wt 285.0 lb

## 2016-12-12 DIAGNOSIS — R0600 Dyspnea, unspecified: Secondary | ICD-10-CM

## 2016-12-12 DIAGNOSIS — R0609 Other forms of dyspnea: Secondary | ICD-10-CM | POA: Diagnosis not present

## 2016-12-12 DIAGNOSIS — R002 Palpitations: Secondary | ICD-10-CM

## 2016-12-12 NOTE — Progress Notes (Signed)
BP 128/77   Pulse 61   Wt 285 lb (129.3 kg)   SpO2 98%   BMI 36.10 kg/m    Subjective:    Patient ID: [name], male    DOB: November 25, 1945, 71 y.o.   MRN: [id number]  HPI: DANTA BAUMGARDNER is a 71 y.o. male  Chief Complaint  Patient presents with  . Dizziness    Started in the summer.   Patient with some lightheaded dizzy-type sensation comes on with particularly heavy work. 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 Will come on with very heavy labor of the above and repeated hitting the [place]. This lightheaded spell is lasted about 2030 minutes. This went on this summer patient reviewed with his cardiologist who did stress testing which BACK normal. These notes were reviewed stress test was not available. Patient's otherwise does well during the day. Has a lot of stress with caregiver role. Patient has metoprolol that he takes when necessary for hand tremor took metoprolol one day prior to splitting wood did better the other time he had the spell as noted above he did not take the metoprolol. Did not check his pulse during that time though doesn't feel like it was racing.   Relevant past medical, surgical, family and social history reviewed and updated as indicated. Interim medical history since our last visit reviewed. Allergies and medications reviewed and updated.  Review of Systems  Constitutional: Negative.   Respiratory: Negative.   Cardiovascular: Negative.     Per HPI unless specifically indicated above     Objective:    BP 128/77   Pulse 61   Wt 285 lb (129.3 kg)   SpO2 98%   BMI 36.10 kg/m   Wt Readings from Last 3 Encounters:  12/12/16 285 lb (129.3 kg)  09/13/16 281 lb (127.5 kg)  08/17/16 283 lb 12.8 oz (128.7 kg)    Physical Exam  Constitutional: He is oriented to person, place, and time. He appears well-developed and well-nourished.  HENT:  Head: Normocephalic and atraumatic.  Eyes: Conjunctivae and EOM are normal.  Neck: Normal  range of motion.  Cardiovascular: Normal rate, regular rhythm and normal heart sounds.   EKG normal sinus showing some old changes but otherwise no acute changes.  Pulmonary/Chest: Effort normal and breath sounds normal.  Musculoskeletal: Normal range of motion.  Neurological: He is alert and oriented to person, place, and time.  Skin: No erythema.  Psychiatric: He has a normal mood and affect. His behavior is normal. Judgment and thought content normal.    Results for orders placed or performed in visit on 09/13/16  Microscopic Examination  Result Value Ref Range   WBC, UA 0-5 0 - 5 /hpf   RBC, UA None seen 0 - 2 /hpf   Epithelial Cells (non renal) CANCELED    Bacteria, UA None seen None seen/Few  CBC with Differential/Platelet  Result Value Ref Range   WBC 4.4 3.4 - 10.8 x10E3/uL   RBC 4.54 4.14 - 5.80 x10E6/uL   Hemoglobin 14.1 13.0 - 17.7 g/dL   Hematocrit 42.3 37.5 - 51.0 %   MCV 93 79 - 97 fL   MCH 31.1 26.6 - 33.0 pg   MCHC 33.3 31.5 - 35.7 g/dL   RDW 13.9 12.3 - 15.4 %   Platelets 207 150 - 379 x10E3/uL   Neutrophils 50 Not Estab. %   Lymphs 36 Not Estab. %   Monocytes 9 Not Estab. %  Eos 4 Not Estab. %   Basos 1 Not Estab. %   Neutrophils Absolute 2.2 1.4 - 7.0 x10E3/uL   Lymphocytes Absolute 1.6 0.7 - 3.1 x10E3/uL   Monocytes Absolute 0.4 0.1 - 0.9 x10E3/uL   EOS (ABSOLUTE) 0.2 0.0 - 0.4 x10E3/uL   Basophils Absolute 0.0 0.0 - 0.2 x10E3/uL   Immature Granulocytes 0 Not Estab. %   Immature Grans (Abs) 0.0 0.0 - 0.1 x10E3/uL  Comprehensive metabolic panel  Result Value Ref Range   Glucose 117 (H) 65 - 99 mg/dL   BUN 15 8 - 27 mg/dL   Creatinine, Ser 0.97 0.76 - 1.27 mg/dL   GFR calc non Af Amer 79 >59 mL/min/1.73   GFR calc Af Amer 92 >59 mL/min/1.73   BUN/Creatinine Ratio 15 10 - 24   Sodium 141 134 - 144 mmol/L   Potassium 3.9 3.5 - 5.2 mmol/L   Chloride 104 96 - 106 mmol/L   CO2 21 20 - 29 mmol/L   Calcium 8.9 8.6 - 10.2 mg/dL   Total Protein 6.3 6.0 -  8.5 g/dL   Albumin 4.0 3.6 - 4.8 g/dL   Globulin, Total 2.3 1.5 - 4.5 g/dL   Albumin/Globulin Ratio 1.7 1.2 - 2.2   Bilirubin Total 0.6 0.0 - 1.2 mg/dL   Alkaline Phosphatase 52 39 - 117 IU/L   AST 14 0 - 40 IU/L   ALT 11 0 - 44 IU/L  Lipid panel  Result Value Ref Range   Cholesterol, Total 163 100 - 199 mg/dL   Triglycerides 79 0 - 149 mg/dL   HDL 51 >39 mg/dL   VLDL Cholesterol Cal 16 5 - 40 mg/dL   LDL Calculated 96 0 - 99 mg/dL   Chol/HDL Ratio 3.2 0.0 - 5.0 ratio  PSA  Result Value Ref Range   Prostate Specific Ag, Serum 2.4 0.0 - 4.0 ng/mL  TSH  Result Value Ref Range   TSH 2.040 0.450 - 4.500 uIU/mL  Urinalysis, Routine w reflex microscopic  Result Value Ref Range   Specific Gravity, UA 1.015 1.005 - 1.030   pH, UA 6.0 5.0 - 7.5   Color, UA Yellow Yellow   Appearance Ur Clear Clear   Leukocytes, UA Negative Negative   Protein, UA Negative Negative/Trace   Glucose, UA Negative Negative   Ketones, UA Negative Negative   RBC, UA Negative Negative   Bilirubin, UA Negative Negative   Urobilinogen, Ur 1.0 0.2 - 1.0 mg/dL   Nitrite, UA Negative Negative   Microscopic Examination See below:       Assessment & Plan:   Problem List Items Addressed This Visit      Other   Palpitations    Reviewed with patient cardiology notes which are negative patient's orthostatic tilt negative. Most likely scenario of too much too fast. Patient will work on pacing himself using breaks will check his pulse if symptoms happen again. May consider trying metoprolol as that maybe helped previously.       Other Visit Diagnoses    Exertional dyspnea    -  Primary   Relevant Orders   EKG 12-Lead (Completed)       Follow up plan: Return for As scheduled.

## 2016-12-12 NOTE — Assessment & Plan Note (Signed)
Reviewed with patient cardiology notes which are negative patient's orthostatic tilt negative. Most likely scenario of too much too fast. Patient will work on pacing himself using breaks will check his pulse if symptoms happen again. May consider trying metoprolol as that maybe helped previously.

## 2017-03-19 ENCOUNTER — Ambulatory Visit: Payer: Medicare Other | Admitting: Family Medicine

## 2017-07-16 DIAGNOSIS — E119 Type 2 diabetes mellitus without complications: Secondary | ICD-10-CM | POA: Diagnosis not present

## 2017-07-16 LAB — HM DIABETES EYE EXAM

## 2017-07-19 DIAGNOSIS — H40003 Preglaucoma, unspecified, bilateral: Secondary | ICD-10-CM | POA: Diagnosis not present

## 2017-08-17 ENCOUNTER — Emergency Department
Admission: EM | Admit: 2017-08-17 | Discharge: 2017-08-17 | Disposition: A | Discharge status: Home / Self Care | Payer: Medicare Other | Attending: Emergency Medicine | Admitting: Emergency Medicine

## 2017-08-17 ENCOUNTER — Encounter: Payer: Self-pay | Admitting: *Deleted

## 2017-08-17 ENCOUNTER — Other Ambulatory Visit: Payer: Self-pay

## 2017-08-17 ENCOUNTER — Emergency Department: Payer: Medicare Other

## 2017-08-17 DIAGNOSIS — Z87891 Personal history of nicotine dependence: Secondary | ICD-10-CM | POA: Insufficient documentation

## 2017-08-17 DIAGNOSIS — H1131 Conjunctival hemorrhage, right eye: Secondary | ICD-10-CM

## 2017-08-17 DIAGNOSIS — M25511 Pain in right shoulder: Secondary | ICD-10-CM | POA: Insufficient documentation

## 2017-08-17 DIAGNOSIS — M5489 Other dorsalgia: Secondary | ICD-10-CM | POA: Diagnosis present

## 2017-08-17 DIAGNOSIS — J42 Unspecified chronic bronchitis: Secondary | ICD-10-CM | POA: Diagnosis not present

## 2017-08-17 LAB — CBC WITH DIFFERENTIAL/PLATELET
Basophils Absolute: 0 10*3/uL (ref 0–0.1)
Basophils Relative: 1 %
EOS ABS: 0.2 10*3/uL (ref 0–0.7)
EOS PCT: 4 %
HEMATOCRIT: 38.9 % — AB (ref 40.0–52.0)
HEMOGLOBIN: 13.4 g/dL (ref 13.0–18.0)
LYMPHS ABS: 1.4 10*3/uL (ref 1.0–3.6)
LYMPHS PCT: 24 %
MCH: 32.4 pg (ref 26.0–34.0)
MCHC: 34.5 g/dL (ref 32.0–36.0)
MCV: 93.9 fL (ref 80.0–100.0)
MONO ABS: 0.6 10*3/uL (ref 0.2–1.0)
MONOS PCT: 11 %
Neutro Abs: 3.3 10*3/uL (ref 1.4–6.5)
Neutrophils Relative %: 60 %
Platelets: 206 10*3/uL (ref 150–440)
RBC: 4.14 MIL/uL — AB (ref 4.40–5.90)
RDW: 13.7 % (ref 11.5–14.5)
WBC: 5.6 10*3/uL (ref 3.8–10.6)

## 2017-08-17 LAB — BASIC METABOLIC PANEL
Anion gap: 8 (ref 5–15)
BUN: 15 mg/dL (ref 8–23)
CO2: 26 mmol/L (ref 22–32)
CREATININE: 0.89 mg/dL (ref 0.61–1.24)
Calcium: 8.7 mg/dL — ABNORMAL LOW (ref 8.9–10.3)
Chloride: 107 mmol/L (ref 98–111)
GFR calc Af Amer: >60 mL/min (ref >60)
GFR calc non Af Amer: >60 mL/min (ref >60)
GLUCOSE: 114 mg/dL — AB (ref 70–99)
POTASSIUM: 3.9 mmol/L (ref 3.5–5.1)
SODIUM: 141 mmol/L (ref 135–145)

## 2017-08-17 MED ORDER — TETRACAINE HCL 0.5 % OP SOLN
OPHTHALMIC | Status: AC
  Filled 2017-08-17: qty 4

## 2017-08-17 MED ORDER — FLUORESCEIN SODIUM 1 MG OP STRP
ORAL_STRIP | OPHTHALMIC | Status: AC
  Filled 2017-08-17: qty 1

## 2017-08-17 NOTE — Discharge Instructions (Addendum)
Please call the orthopedic surgeon Dr. Posey Pronto in the morning.  Tell them you were seen in the ER for the shoulder pain.  He should go to see you within a few days.  He may be out of see later today.  Please follow-up with your regular eye doctor to check on your eye.  Try Motrin 3 of the over-the-counter pills 3 times a day with food for 2 or 3 days to see if that helps the shoulder.  Do not take Motrin and Naprosyn together.  Use only 1 of the other.  Or you can use Tylenol as directed.  Please return for any worse pain fever vomiting visual changes numbness or anything else.

## 2017-08-17 NOTE — ED Triage Notes (Signed)
Pt with c/o back pain x 3 weeks and over the past few weeks he feels like it is moving around to his shoulder and face. Yesterday he noticed his right eye was red and swollen/drooping. Arm strength is equal and he can hold both arms up. Pt describes as maybe arthritis pain

## 2017-08-17 NOTE — ED Provider Notes (Signed)
[facility] Emergency Department Provider Note   ____________________________________________   First MD Initiated Contact with Patient 08/17/17 0505     (approximate)  I have reviewed the triage vital signs and the nursing notes.   HISTORY  Chief Complaint Back Pain and Facial Pain    HPI Derek Wright is a 72 y.o. male who reports about 3 weeks ago he got up from a chair in his low back began hurting.  Since then the pain seems to have radiated up into the neck and the back of the head in the occiput.  Then it has come down into the right sternocleidomastoid so that when he turns his head to the right it hurts there.  He is also having pain in the right shoulder with movement.  He can hold his arm out and up if I bring it up but he has too much pain to do it himself.  He does not have any numbness or weakness in his hands although the pain from the shoulder radiates down to the elbow and sometimes below.  Does not have any tenderness to palpation in the occiput where the pain is or in the back of the neck or in the sternocleidomastoid or in the shoulder.  He is not running a fever.  He complains of some sensitivity to air in his right eye for the last few days and today he notices right cornea was red and seem to be somewhat swollen.  His visual acuity is unchanged.  He wears glasses.   Past Medical History:  Diagnosis Date  . Arthritis   . GERD (gastroesophageal reflux disease)    RARE-NO MEDS  . Pre-diabetes   . Sleep apnea    MILD-NO CPAP-COULD NOT TOELERATE    Patient Active Problem List   Diagnosis Date Noted  . Palpitations 12/12/2016  . Advanced care planning/counseling discussion 09/13/2016  . Tremor, essential 09/13/2016  . Vertigo 01/19/2016  . Right inguinal hernia 09/14/2015  . Ankle edema 09/13/2015  . Hernia, inguinal, left 07/14/2015  . Arthralgia 08/12/2014  . Pre-diabetes 08/12/2014  . BPH without obstruction/lower urinary tract  symptoms 08/12/2014    Past Surgical History:  Procedure Laterality Date  . COLONOSCOPY  9518  . hair folicle removed    . INGUINAL HERNIA REPAIR Bilateral 09/29/2015   Procedure: LAPAROSCOPIC BILATERAL INGUINAL HERNIA REPAIR;  Surgeon: Robert Bellow, MD;  Location: [facility] ORS;  Service: General;  Laterality: Bilateral;  . SKIN GRAFT Right 1966   hand    Prior to Admission medications   Medication Sig Start Date End Date Taking? Authorizing Provider  Menthol, Topical Analgesic, (ICY HOT EX) Apply 1 application topically daily as needed. For pain    [provider]  metoprolol tartrate (LOPRESSOR) 25 MG tablet Take 1 tablet (25 mg total) by mouth daily as needed. For tremor 09/13/16   Guadalupe Maple, MD  naproxen sodium (ALEVE) 220 MG tablet Take 220 mg by mouth as needed.    [provider]    Allergies Patient has no known allergies.  Family History  Problem Relation Age of Onset  . Ovarian cancer Mother   . Heart disease Father   . Lymphoma Maternal Uncle     Social History Social History   Tobacco Use  . Smoking status: Former Smoker    Packs/day: 0.25    Years: 15.00    Pack years: 3.75    Types: Cigarettes    Last attempt to quit:  09/13/1980    Years since quitting: 36.9  . Smokeless tobacco: Never Used  Substance Use Topics  . Alcohol use: Yes    Alcohol/week: 1.2 oz    Types: 2 Cans of beer per week  . Drug use: No    Review of Systems  Constitutional: No fever/chills Eyes: No visual changes. ENT: No sore throat. Cardiovascular: Denies chest pain. Respiratory: Denies shortness of breath. Gastrointestinal: No abdominal pain.  No nausea, no vomiting.  No diarrhea.  No constipation. Genitourinary: Negative for dysuria. Musculoskeletal: See HPI n. Skin: Negative for rash. Neurological: Negative for headaches, focal weakness   ____________________________________________   PHYSICAL EXAM:  VITAL SIGNS: ED Triage Vitals  Enc Vitals  Group     BP 08/17/17 0429 (!) 147/90     Pulse Rate 08/17/17 0429 81     Resp 08/17/17 0429 17     Temp 08/17/17 0429 98.3 F (36.8 C)     Temp Source 08/17/17 0429 Oral     SpO2 08/17/17 0429 97 %     Weight 08/17/17 0430 277 lb (125.6 kg)     Height 08/17/17 0430 6\' 5"  (1.956 m)     Head Circumference --      Peak Flow --      Pain Score 08/17/17 0436 8     Pain Loc --      Pain Edu? --      Excl. in [place]? --    Constitutional: Alert and oriented. Well appearing and in no acute distress. Eyes: Right conjunctiva has a subconjunctival hemorrhage present.  There is also a 1 mm or possibly 2 mm small semicircular divot at the bottom of the right iris.  Patient denies any injury to the eye note nothing poked him in the eye and again no visual changes PERRL. EOMI. right fundus looks normal Head: Atraumaticn nontender. Nose: No congestion/rhinnorhea. Mouth/Throat: Mucous membranes are moist.  Oropharynx non-erythematous. Neck: No stridor.No cervical spine tenderness to palpation. Hematological/Lymphatic/Immunilogical: No cervical lymphadenopathy. Cardiovascular: Normal rate, regular rhythm. Grossly normal heart sounds.  Good peripheral circulation. Respiratory: Normal respiratory effort.  No retractions. Lungs CTAB. Gastrointestinal: Soft and nontender. No distention. No abdominal bruits. No CVA tenderness. Musculoskeletal: No lower extremity tenderness nor edema.  No joint effusions. Neurologic:  Normal speech and language. No gross focal neurologic deficits are appreciated. . Skin:  Skin is warm, dry and intact. No rash noted. Psychiatric: Mood and affect are normal. Speech and behavior are normal.  ____________________________________________   LABS (all labs ordered are listed, but only abnormal results are displayed)  Labs Reviewed  BASIC METABOLIC PANEL - Abnormal; Notable for the following components:      Result Value   Glucose, Bld 114 (*)    Calcium 8.7 (*)    All other  components within normal limits  CBC WITH DIFFERENTIAL/PLATELET - Abnormal; Notable for the following components:   RBC 4.14 (*)    HCT 38.9 (*)    All other components within normal limits   ____________________________________________  EKG   ____________________________________________  RADIOLOGY  ED MD interpretation: Shoulder and chest x-ray read by radiology reviewed by me showed no acute pathology.    Official radiology report(s): Dg Chest 2 View  Result Date: 08/17/2017 CLINICAL DATA:  72 y/o  M; upper back and right shoulder pain. EXAM: CHEST - 2 VIEW COMPARISON:  01/01/2016 chest radiograph. FINDINGS: Normal cardiac silhouette. Aortic atherosclerosis with calcification. Chronic bronchitic changes. No consolidation. No pleural effusion or pneumothorax. No acute osseous  abnormality is evident. IMPRESSION: Chronic bronchitic changes.  No acute pulmonary process identified. Electronically Signed   By: Kristine Garbe M.D.   On: 08/17/2017 05:51   Dg Shoulder Right  Result Date: 08/17/2017 CLINICAL DATA:  72 y/o  M; upper back and right shoulder pain. EXAM: RIGHT SHOULDER - 2+ VIEW COMPARISON:  None. FINDINGS: There is no evidence of fracture or dislocation. There is no evidence of arthropathy or other focal bone abnormality. Soft tissues are unremarkable. IMPRESSION: Negative. Electronically Signed   By: Kristine Garbe M.D.   On: 08/17/2017 05:52    ____________________________________________   PROCEDURES  Procedure(s) performed: Fluoroscopy seen of the right eye shows nothing.  There is no fluid leak there is nothing on the cornea.  There is only a small sub-conjunctival hemorrhage.  Again patient reports his vision is normal.  Extraocular movements are intact.  Funduscopic exam looks okay without dilating the eye.  Patient reports the eye is not really painful just a little bit sensitive to wind.  Procedures  Critical Care performed:    ____________________________________________   INITIAL IMPRESSION / ASSESSMENT AND PLAN / ED COURSE           ____________________________________________   FINAL CLINICAL IMPRESSION(S) / ED DIAGNOSES  Final diagnoses:  Right shoulder pain, unspecified chronicity  Subconjunctival hemorrhage of right eye     ED Discharge Orders    None       Note:  This document was prepared using Dragon voice recognition software and may include unintentional dictation errors.    Nena Polio, MD 08/17/17 [phone]

## 2017-08-20 ENCOUNTER — Ambulatory Visit (INDEPENDENT_AMBULATORY_CARE_PROVIDER_SITE_OTHER): Payer: Medicare Other

## 2017-08-20 VITALS — BP 152/80 | HR 76 | Temp 98.6°F | Resp 16 | Ht 74.0 in | Wt 279.1 lb

## 2017-08-20 DIAGNOSIS — I10 Essential (primary) hypertension: Secondary | ICD-10-CM | POA: Diagnosis not present

## 2017-08-20 DIAGNOSIS — N400 Enlarged prostate without lower urinary tract symptoms: Secondary | ICD-10-CM

## 2017-08-20 DIAGNOSIS — R7303 Prediabetes: Secondary | ICD-10-CM

## 2017-08-20 DIAGNOSIS — Z0000 Encounter for general adult medical examination without abnormal findings: Secondary | ICD-10-CM | POA: Diagnosis not present

## 2017-08-20 DIAGNOSIS — R0609 Other forms of dyspnea: Secondary | ICD-10-CM | POA: Diagnosis not present

## 2017-08-20 DIAGNOSIS — E782 Mixed hyperlipidemia: Secondary | ICD-10-CM

## 2017-08-20 DIAGNOSIS — Z23 Encounter for immunization: Secondary | ICD-10-CM

## 2017-08-20 DIAGNOSIS — R0600 Dyspnea, unspecified: Secondary | ICD-10-CM

## 2017-08-20 LAB — URINALYSIS, ROUTINE W REFLEX MICROSCOPIC
Bilirubin, UA: NEGATIVE
Glucose, UA: NEGATIVE
Ketones, UA: NEGATIVE
LEUKOCYTES UA: NEGATIVE
NITRITE UA: NEGATIVE
PH UA: 7.5 (ref 5.0–7.5)
Protein, UA: NEGATIVE
RBC, UA: NEGATIVE
Specific Gravity, UA: 1.02 (ref 1.005–1.030)
Urobilinogen, Ur: 1 mg/dL (ref 0.2–1.0)

## 2017-08-20 NOTE — Patient Instructions (Signed)
Derek Wright , Thank you for taking time to come for your Medicare Wellness Visit. I appreciate your ongoing commitment to your health goals. Please review the following plan we discussed and let me know if I can assist you in the future.   Screening recommendations/referrals: Colonoscopy: colonoscopy completed 10/26/2010 Recommended yearly ophthalmology/optometry visit for glaucoma screening and checkup Recommended yearly dental visit for hygiene and checkup  Vaccinations: Influenza vaccine: due 10/2017 Pneumococcal vaccine: completed series Tdap vaccine: up to date  Shingles vaccine: shingrix eligible, check with your insurance company for coverage   Advanced directives: Please bring a copy of your health care power of attorney and living will to the office at your convenience.  Conditions/risks identified: Recommend drinking at least 6-8 glasses of water a day   Next appointment: Follow up on 08/27/2017 at 10:30am with Dr.Crissman. Follow up in one year for your annual wellness exam.   Preventive Care 65 Years and Older, Male Preventive care refers to lifestyle choices and visits with your health care provider that can promote health and wellness. What does preventive care include?  A yearly physical exam. This is also called an annual well check.  Dental exams once or twice a year.  Routine eye exams. Ask your health care provider how often you should have your eyes checked.  Personal lifestyle choices, including:  Daily care of your teeth and gums.  Regular physical activity.  Eating a healthy diet.  Avoiding tobacco and drug use.  Limiting alcohol use.  Practicing safe sex.  Taking low doses of aspirin every day.  Taking vitamin and mineral supplements as recommended by your health care provider. What happens during an annual well check? The services and screenings done by your health care provider during your annual well check will depend on your age, overall health,  lifestyle risk factors, and family history of disease. Counseling  Your health care provider may ask you questions about your:  Alcohol use.  Tobacco use.  Drug use.  Emotional well-being.  Home and relationship well-being.  Sexual activity.  Eating habits.  History of falls.  Memory and ability to understand (cognition).  Work and work Statistician. Screening  You may have the following tests or measurements:  Height, weight, and BMI.  Blood pressure.  Lipid and cholesterol levels. These may be checked every 5 years, or more frequently if you are over 16 years old.  Skin check.  Lung cancer screening. You may have this screening every year starting at age 39 if you have a 30-pack-year history of smoking and currently smoke or have quit within the past 15 years.  Fecal occult blood test (FOBT) of the stool. You may have this test every year starting at age 49.  Flexible sigmoidoscopy or colonoscopy. You may have a sigmoidoscopy every 5 years or a colonoscopy every 10 years starting at age 64.  Prostate cancer screening. Recommendations will vary depending on your family history and other risks.  Hepatitis C blood test.  Hepatitis B blood test.  Sexually transmitted disease (STD) testing.  Diabetes screening. This is done by checking your blood sugar (glucose) after you have not eaten for a while (fasting). You may have this done every 1-3 years.  Abdominal aortic aneurysm (AAA) screening. You may need this if you are a current or former smoker.  Osteoporosis. You may be screened starting at age 19 if you are at high risk. Talk with your health care provider about your test results, treatment options, and if necessary,  the need for more tests. Vaccines  Your health care provider may recommend certain vaccines, such as:  Influenza vaccine. This is recommended every year.  Tetanus, diphtheria, and acellular pertussis (Tdap, Td) vaccine. You may need a Td booster  every 10 years.  Zoster vaccine. You may need this after age 46.  Pneumococcal 13-valent conjugate (PCV13) vaccine. One dose is recommended after age 33.  Pneumococcal polysaccharide (PPSV23) vaccine. One dose is recommended after age 23. Talk to your health care provider about which screenings and vaccines you need and how often you need them. This information is not intended to replace advice given to you by your health care provider. Make sure you discuss any questions you have with your health care provider. Document Released: 02/26/2015 Document Revised: 10/20/2015 Document Reviewed: 12/01/2014 Elsevier Interactive Patient Education  2017 [place] Prevention in the Home Falls can cause injuries. They can happen to people of all ages. There are many things you can do to make your home safe and to help prevent falls. What can I do on the outside of my home?  Regularly fix the edges of walkways and driveways and fix any cracks.  Remove anything that might make you trip as you walk through a door, such as a raised step or threshold.  Trim any bushes or trees on the path to your home.  Use bright outdoor lighting.  Clear any walking paths of anything that might make someone trip, such as rocks or tools.  Regularly check to see if handrails are loose or broken. Make sure that both sides of any steps have handrails.  Any raised decks and porches should have guardrails on the edges.  Have any leaves, snow, or ice cleared regularly.  Use sand or salt on walking paths during winter.  Clean up any spills in your garage right away. This includes oil or grease spills. What can I do in the bathroom?  Use night lights.  Install grab bars by the toilet and in the tub and shower. Do not use towel bars as grab bars.  Use non-skid mats or decals in the tub or shower.  If you need to sit down in the shower, use a plastic, non-slip stool.  Keep the floor dry. Clean up any  water that spills on the floor as soon as it happens.  Remove soap buildup in the tub or shower regularly.  Attach bath mats securely with double-sided non-slip rug tape.  Do not have throw rugs and other things on the floor that can make you trip. What can I do in the bedroom?  Use night lights.  Make sure that you have a light by your bed that is easy to reach.  Do not use any sheets or blankets that are too big for your bed. They should not hang down onto the floor.  Have a firm chair that has side arms. You can use this for support while you get dressed.  Do not have throw rugs and other things on the floor that can make you trip. What can I do in the kitchen?  Clean up any spills right away.  Avoid walking on wet floors.  Keep items that you use a lot in easy-to-reach places.  If you need to reach something above you, use a strong step stool that has a grab bar.  Keep electrical cords out of the way.  Do not use floor polish or wax that makes floors slippery. If you must use  wax, use non-skid floor wax.  Do not have throw rugs and other things on the floor that can make you trip. What can I do with my stairs?  Do not leave any items on the stairs.  Make sure that there are handrails on both sides of the stairs and use them. Fix handrails that are broken or loose. Make sure that handrails are as long as the stairways.  Check any carpeting to make sure that it is firmly attached to the stairs. Fix any carpet that is loose or worn.  Avoid having throw rugs at the top or bottom of the stairs. If you do have throw rugs, attach them to the floor with carpet tape.  Make sure that you have a light switch at the top of the stairs and the bottom of the stairs. If you do not have them, ask someone to add them for you. What else can I do to help prevent falls?  Wear shoes that:  Do not have high heels.  Have rubber bottoms.  Are comfortable and fit you well.  Are closed  at the toe. Do not wear sandals.  If you use a stepladder:  Make sure that it is fully opened. Do not climb a closed stepladder.  Make sure that both sides of the stepladder are locked into place.  Ask someone to hold it for you, if possible.  Clearly mark and make sure that you can see:  Any grab bars or handrails.  First and last steps.  Where the edge of each step is.  Use tools that help you move around (mobility aids) if they are needed. These include:  Canes.  Walkers.  Scooters.  Crutches.  Turn on the lights when you go into a dark area. Replace any light bulbs as soon as they burn out.  Set up your furniture so you have a clear path. Avoid moving your furniture around.  If any of your floors are uneven, fix them.  If there are any pets around you, be aware of where they are.  Review your medicines with your doctor. Some medicines can make you feel dizzy. This can increase your chance of falling. Ask your doctor what other things that you can do to help prevent falls. This information is not intended to replace advice given to you by your health care provider. Make sure you discuss any questions you have with your health care provider. Document Released: 11/26/2008 Document Revised: 07/08/2015 Document Reviewed: 03/06/2014 Elsevier Interactive Patient Education  2017 [employer].

## 2017-08-20 NOTE — Progress Notes (Signed)
Subjective:   Derek Wright is a 72 y.o. male who presents for Medicare Annual/Subsequent preventive examination.  Review of Systems:   Cardiac Risk Factors include: advanced age (>47men, >85 women);hypertension;male gender;obesity (BMI >30kg/m2)     Objective:    Vitals: BP (!) 152/80 (BP Location: Left Arm, Patient Position: Sitting)   Pulse 76   Temp 98.6 F (37 C) (Temporal)   Resp 16   Ht 6\' 2"  (1.88 m)   Wt 279 lb 1.6 oz (126.6 kg)   BMI 35.83 kg/m   Body mass index is 35.83 kg/m.  Advanced Directives 08/20/2017 08/17/2016 02/29/2016 01/01/2016  Does Patient Have a Medical Advance Directive? Yes Yes Yes Yes  Type of Advance Directive Living will;Healthcare Power of [place];Living will Living will [place];Living will  Does patient want to make changes to medical advance directive? - - - No - Patient declined  Copy of [place] in Chart? No - copy requested No - copy requested - No - copy requested    Tobacco Social History   Tobacco Use  Smoking Status Former Smoker  . Packs/day: 0.25  . Years: 15.00  . Pack years: 3.75  . Types: Cigarettes  . Last attempt to quit: 09/13/1980  . Years since quitting: 36.9  Smokeless Tobacco Never Used     Counseling given: Not Answered   Clinical Intake:  Pre-visit preparation completed: Yes  Pain : No/denies pain Pain Score: 0-No pain     Nutritional Status: BMI > 30  Obese Nutritional Risks: None Diabetes: No  How often do you need to have someone help you when you read instructions, pamphlets, or other written materials from your doctor or pharmacy?: 1 - Never What is the last grade level you completed in school?: high school   Interpreter Needed?: No  Information entered by :: Tiannah Greenly,LPN   Past Medical History:  Diagnosis Date  . Arthritis   . GERD (gastroesophageal reflux disease)    RARE-NO MEDS  . Pre-diabetes   . Sleep apnea    MILD-NO CPAP-COULD NOT TOELERATE   Past Surgical History:  Procedure Laterality Date  . COLONOSCOPY  2951  . hair folicle removed    . INGUINAL HERNIA REPAIR Bilateral 09/29/2015   Procedure: LAPAROSCOPIC BILATERAL INGUINAL HERNIA REPAIR;  Surgeon: Robert Bellow, MD;  Location: [facility] ORS;  Service: General;  Laterality: Bilateral;  . SKIN GRAFT Right 1966   hand   Family History  Problem Relation Age of Onset  . Ovarian cancer Mother   . Heart disease Father   . Lymphoma Maternal Uncle    Social History   Socioeconomic History  . Marital status: Married    Spouse name: Not on file  . Number of children: Not on file  . Years of education: 2  . Highest education level: High school graduate  Occupational History  . Not on file  Social Needs  . Financial resource strain: Not hard at all  . Food insecurity:    Worry: Never true    Inability: Never true  . Transportation needs:    Medical: No    Non-medical: No  Tobacco Use  . Smoking status: Former Smoker    Packs/day: 0.25    Years: 15.00    Pack years: 3.75    Types: Cigarettes    Last attempt to quit: 09/13/1980    Years since quitting: 36.9  . Smokeless tobacco: Never Used  Substance and  Sexual Activity  . Alcohol use: Yes    Alcohol/week: 1.2 oz    Types: 2 Cans of beer per week  . Drug use: No  . Sexual activity: Not on file  Lifestyle  . Physical activity:    Days per week: 0 days    Minutes per session: 0 min  . Stress: Not at all  Relationships  . Social connections:    Talks on phone: More than three times a week    Gets together: More than three times a week    Attends religious service: More than 4 times per year    Active member of club or organization: Yes    Attends meetings of clubs or organizations: More than 4 times per year    Relationship status: Married  Other Topics Concern  . Not on file  Social History Narrative  . Not on file    Outpatient Encounter Medications as of 08/20/2017    Medication Sig  . furosemide (LASIX) 20 MG tablet Take by mouth.  . Menthol, Topical Analgesic, (ICY HOT EX) Apply 1 application topically daily as needed. For pain  . metoprolol tartrate (LOPRESSOR) 25 MG tablet Take 1 tablet (25 mg total) by mouth daily as needed. For tremor  . naproxen sodium (ALEVE) 220 MG tablet Take 220 mg by mouth as needed.   No facility-administered encounter medications on file as of 08/20/2017.     Activities of Daily Living In your present state of health, do you have any difficulty performing the following activities: 08/20/2017  Hearing? N  Vision? N  Difficulty concentrating or making decisions? N  Walking or climbing stairs? N  Dressing or bathing? N  Doing errands, shopping? N  Preparing Food and eating ? N  Using the Toilet? N  In the past six months, have you accidently leaked urine? N  Do you have problems with loss of bowel control? N  Managing your Medications? N  Managing your Finances? N  Housekeeping or managing your Housekeeping? N  Some recent data might be hidden    Patient Care Team: Guadalupe Maple, MD as PCP - General (Family Medicine) Kem Parkinson, MD (Ophthalmology) Sharene Butters, MD (General Surgery) Yolonda Kida, MD as Consulting Physician (Cardiology)   Assessment:   This is a routine wellness examination for Derek Wright.  Exercise Activities and Dietary recommendations Current Exercise Habits: The patient does not participate in regular exercise at present, Exercise limited by: None identified  Goals    . DIET - INCREASE WATER INTAKE     Recommend drinking at least 6-8 glasses of water a day        Fall Risk Fall Risk  08/20/2017 12/12/2016 09/13/2016 08/17/2016 09/13/2015  Falls in the past year? Yes No Yes Yes No  Number falls in past yr: 1 - 1 1 -  Injury with Fall? No - No No -  Follow up Falls evaluation completed - - - -   Is the patient's home free of loose throw rugs in walkways, pet beds, electrical  cords, etc?   yes      Grab bars in the bathroom? no      Handrails on the stairs?   no      Adequate lighting?   yes  Timed Get Up and Go Performed: Completed in 8 seconds with no use of assistive devices, steady gait. No intervention needed at this time.   Depression Screen PHQ 2/9 Scores 08/20/2017 12/12/2016 09/13/2016 08/17/2016  PHQ -  2 Score 0 0 0 0    Cognitive Function     6CIT Screen 08/20/2017 08/17/2016  What Year? 0 points 0 points  What month? 0 points 0 points  What time? 0 points 0 points  Count back from 20 0 points 0 points  Months in reverse 0 points 0 points  Repeat phrase 0 points 0 points  Total Score 0 0    Immunization History  Administered Date(s) Administered  . Influenza,inj,Quad PF,6+ Mos 11/21/2016  . Influenza,inj,quad, With Preservative 12/01/2016  . Influenza-Unspecified 11/12/2013, 12/29/2014, 11/01/2015  . Pneumococcal Conjugate-13 09/13/2015  . Pneumococcal Polysaccharide-23 03/25/2009, 08/20/2017  . Pneumococcal-Unspecified 03/25/2009  . Tdap 05/08/2013  . Zoster 09/25/2011    Qualifies for Shingles Vaccine? Yes, discussed shingrix vaccine   Screening Tests Health Maintenance  Topic Date Due  . FOOT EXAM  07/[birth year]/1957  . HEMOGLOBIN A1C  03/15/2016  . INFLUENZA VACCINE  09/13/2017  . OPHTHALMOLOGY EXAM  07/17/2018  . COLONOSCOPY  10/25/2020  . TETANUS/TDAP  05/09/2023  . Hepatitis C Screening  Completed  . PNA vac Low Risk Adult  Completed   Cancer Screenings: Lung: Low Dose CT Chest recommended if Age 20-80 years, 30 pack-year currently smoking OR have quit w/in 15years. Patient does not qualify. Colorectal: completed 10/26/2010  Additional Screenings:  Hepatitis C Screening: completed 09/13/2015      Plan:    I have personally reviewed and addressed the Medicare Annual Wellness questionnaire and have noted the following in the patient's chart:  A. Medical and social history B. Use of alcohol, tobacco or illicit drugs  C. Current  medications and supplements D. Functional ability and status E.  Nutritional status F.  Physical activity G. Advance directives H. List of other physicians I.  Hospitalizations, surgeries, and ER visits in previous 12 months J.  [place] such as hearing and vision if needed, cognitive and depression L. Referrals and appointments   In addition, I have reviewed and discussed with patient certain preventive protocols, quality metrics, and best practice recommendations. A written personalized care plan for preventive services as well as general preventive health recommendations were provided to patient.   Signed,  Tyler Aas, LPN Nurse Health Advisor   Nurse Notes:none

## 2017-08-21 ENCOUNTER — Encounter: Payer: Self-pay | Admitting: Family Medicine

## 2017-08-21 LAB — COMPREHENSIVE METABOLIC PANEL
ALT: 14 IU/L (ref 0–44)
AST: 15 IU/L (ref 0–40)
Albumin/Globulin Ratio: 1.4 (ref 1.2–2.2)
Albumin: 3.9 g/dL (ref 3.5–4.8)
Alkaline Phosphatase: 65 IU/L (ref 39–117)
BUN/Creatinine Ratio: 18 (ref 10–24)
BUN: 16 mg/dL (ref 8–27)
Bilirubin Total: 0.5 mg/dL (ref 0.0–1.2)
CALCIUM: 9.1 mg/dL (ref 8.6–10.2)
CO2: 24 mmol/L (ref 20–29)
CREATININE: 0.88 mg/dL (ref 0.76–1.27)
Chloride: 105 mmol/L (ref 96–106)
GFR calc Af Amer: 101 mL/min/{1.73_m2} (ref >59)
GFR calc non Af Amer: 87 mL/min/{1.73_m2} (ref >59)
GLOBULIN, TOTAL: 2.7 g/dL (ref 1.5–4.5)
Glucose: 100 mg/dL — ABNORMAL HIGH (ref 65–99)
Potassium: 4.7 mmol/L (ref 3.5–5.2)
SODIUM: 143 mmol/L (ref 134–144)
Total Protein: 6.6 g/dL (ref 6.0–8.5)

## 2017-08-21 LAB — LIPID PANEL W/O CHOL/HDL RATIO
CHOLESTEROL TOTAL: 157 mg/dL (ref 100–199)
HDL: 50 mg/dL (ref >39)
LDL CALC: 90 mg/dL (ref 0–99)
TRIGLYCERIDES: 83 mg/dL (ref 0–149)
VLDL CHOLESTEROL CAL: 17 mg/dL (ref 5–40)

## 2017-08-21 LAB — CBC WITH DIFFERENTIAL/PLATELET
Basophils Absolute: 0 10*3/uL (ref 0.0–0.2)
Basos: 1 %
EOS (ABSOLUTE): 0.2 10*3/uL (ref 0.0–0.4)
EOS: 3 %
HEMATOCRIT: 39.7 % (ref 37.5–51.0)
HEMOGLOBIN: 13.7 g/dL (ref 13.0–17.7)
IMMATURE GRANULOCYTES: 0 %
Immature Grans (Abs): 0 10*3/uL (ref 0.0–0.1)
LYMPHS: 24 %
Lymphocytes Absolute: 1.4 10*3/uL (ref 0.7–3.1)
MCH: 31.8 pg (ref 26.6–33.0)
MCHC: 34.5 g/dL (ref 31.5–35.7)
MCV: 92 fL (ref 79–97)
MONOCYTES: 12 %
Monocytes Absolute: 0.7 10*3/uL (ref 0.1–0.9)
NEUTROS PCT: 60 %
Neutrophils Absolute: 3.6 10*3/uL (ref 1.4–7.0)
Platelets: 244 10*3/uL (ref 150–450)
RBC: 4.31 x10E6/uL (ref 4.14–5.80)
RDW: 14.4 % (ref 12.3–15.4)
WBC: 6 10*3/uL (ref 3.4–10.8)

## 2017-08-21 LAB — PSA: Prostate Specific Ag, Serum: 2.7 ng/mL (ref 0.0–4.0)

## 2017-08-21 LAB — TSH: TSH: 1.4 u[IU]/mL (ref 0.450–4.500)

## 2017-08-27 ENCOUNTER — Encounter: Payer: Self-pay | Admitting: Family Medicine

## 2017-08-27 ENCOUNTER — Ambulatory Visit (INDEPENDENT_AMBULATORY_CARE_PROVIDER_SITE_OTHER): Payer: Medicare Other | Admitting: Family Medicine

## 2017-08-27 DIAGNOSIS — Z7189 Other specified counseling: Secondary | ICD-10-CM

## 2017-08-27 DIAGNOSIS — Z0001 Encounter for general adult medical examination with abnormal findings: Secondary | ICD-10-CM | POA: Diagnosis not present

## 2017-08-27 DIAGNOSIS — I10 Essential (primary) hypertension: Secondary | ICD-10-CM

## 2017-08-27 DIAGNOSIS — N400 Enlarged prostate without lower urinary tract symptoms: Secondary | ICD-10-CM

## 2017-08-27 MED ORDER — AMOXICILLIN-POT CLAVULANATE 875-125 MG PO TABS: 1.0000 | ORAL_TABLET | Freq: Two times a day (BID) | ORAL | 0 refills | Status: DC

## 2017-08-27 MED ORDER — FUROSEMIDE 20 MG PO TABS: 20.0000 mg | ORAL_TABLET | Freq: Every day | ORAL | 5 refills | Status: DC

## 2017-08-27 MED ORDER — METOPROLOL TARTRATE 25 MG PO TABS: 25.0000 mg | ORAL_TABLET | Freq: Every day | ORAL | 5 refills | Status: DC | PRN

## 2017-08-27 MED ORDER — OMEPRAZOLE 20 MG PO CPDR: 20.0000 mg | DELAYED_RELEASE_CAPSULE | Freq: Every day | ORAL | 12 refills | Status: DC

## 2017-08-27 NOTE — Progress Notes (Signed)
BP 138/80 (BP Location: Left Arm)   Pulse 77   Temp 98.8 F (37.1 C) (Tympanic)   Ht '6\' 1"'  (1.854 m)   Wt 281 lb (127.5 kg)   SpO2 98%   BMI 37.07 kg/m    Subjective:    [name], male    DOB: 1945-03-02, 72 y.o.   MRN: [id number]  HPI: Derek Wright is a 72 y.o. male  Chief Complaint  Patient presents with  . 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 Blood pressure all in all does okay and wants to take Prilosec on a regular basis.  Relevant past medical, surgical, family and social history reviewed and updated as indicated. Interim medical history since our last visit reviewed. Allergies and medications reviewed and updated.  Review of Systems  Constitutional: Negative.   HENT: Positive for congestion, rhinorrhea, sinus pressure and sinus pain.   Eyes: Negative.   Respiratory: Negative.   Cardiovascular: Negative.   Gastrointestinal: Negative.   Endocrine: Negative.   Genitourinary: Negative.   Musculoskeletal: Negative.   Skin: Negative.   Allergic/Immunologic: Negative.   Neurological: Negative.   Hematological: Negative.   Psychiatric/Behavioral: Negative.     Per HPI unless specifically indicated above     Objective:    BP 138/80 (BP Location: Left Arm)   Pulse 77   Temp 98.8 F (37.1 C) (Tympanic)   Ht '6\' 1"'  (1.854 m)   Wt 281 lb (127.5 kg)   SpO2 98%   BMI 37.07 kg/m   Wt Readings from Last 3 Encounters:  08/27/17 281 lb (127.5 kg)  08/20/17 279 lb 1.6 oz (126.6 kg)  08/17/17 277 lb (125.6 kg)    Physical Exam  Constitutional: He is oriented to person, place, and time. He appears well-developed and well-nourished.  HENT:  Head: Normocephalic and atraumatic.  Right Ear: External ear normal.  Left Ear: External ear normal.  Eyes: Pupils are equal, round, and reactive to light.  Conjunctivae and EOM are normal.  Neck: Normal range of motion. Neck supple.  Cardiovascular: Normal rate, regular rhythm, normal heart sounds and intact distal pulses.  Pulmonary/Chest: Effort normal and breath sounds normal.  Abdominal: Soft. Bowel sounds are normal. There is no splenomegaly or hepatomegaly.  Genitourinary: Rectum normal and penis normal.  Genitourinary Comments: BPH changes   Musculoskeletal: Normal range of motion.  Neurological: He is alert and oriented to person, place, and time. He has normal reflexes.  Skin: No rash noted. No erythema.  Psychiatric: He has a normal mood and affect. His behavior is normal. Judgment and thought content normal.    Results for orders placed or performed in visit on 08/20/17  CBC with Differential  Result Value Ref Range   WBC 6.0 3.4 - 10.8 x10E3/uL   RBC 4.31 4.14 - 5.80 x10E6/uL   Hemoglobin 13.7 13.0 - 17.7 g/dL   Hematocrit 39.7 37.5 - 51.0 %   MCV 92 79 - 97 fL   MCH 31.8 26.6 - 33.0 pg   MCHC 34.5 31.5 - 35.7 g/dL   RDW 14.4 12.3 - 15.4 %   Platelets 244 150 - 450 x10E3/uL   Neutrophils 60 Not Estab. %   Lymphs 24 Not Estab. %   Monocytes 12 Not Estab. %   Eos 3 Not Estab. %   Basos 1  Not Estab. %   Neutrophils Absolute 3.6 1.4 - 7.0 x10E3/uL   Lymphocytes Absolute 1.4 0.7 - 3.1 x10E3/uL   Monocytes Absolute 0.7 0.1 - 0.9 x10E3/uL   EOS (ABSOLUTE) 0.2 0.0 - 0.4 x10E3/uL   Basophils Absolute 0.0 0.0 - 0.2 x10E3/uL   Immature Granulocytes 0 Not Estab. %   Immature Grans (Abs) 0.0 0.0 - 0.1 x10E3/uL  Comp Met (CMET)  Result Value Ref Range   Glucose 100 (H) 65 - 99 mg/dL   BUN 16 8 - 27 mg/dL   Creatinine, Ser 0.88 0.76 - 1.27 mg/dL   GFR calc non Af Amer 87 >59 mL/min/1.73   GFR calc Af Amer 101 >59 mL/min/1.73   BUN/Creatinine Ratio 18 10 - 24   Sodium 143 134 - 144 mmol/L   Potassium 4.7 3.5 - 5.2 mmol/L   Chloride 105 96 - 106 mmol/L   CO2 24 20 - 29 mmol/L   Calcium 9.1 8.6 - 10.2 mg/dL   Total Protein  6.6 6.0 - 8.5 g/dL   Albumin 3.9 3.5 - 4.8 g/dL   Globulin, Total 2.7 1.5 - 4.5 g/dL   Albumin/Globulin Ratio 1.4 1.2 - 2.2   Bilirubin Total 0.5 0.0 - 1.2 mg/dL   Alkaline Phosphatase 65 39 - 117 IU/L   AST 15 0 - 40 IU/L   ALT 14 0 - 44 IU/L  Lipid Panel w/o Chol/HDL Ratio  Result Value Ref Range   Cholesterol, Total 157 100 - 199 mg/dL   Triglycerides 83 0 - 149 mg/dL   HDL 50 >39 mg/dL   VLDL Cholesterol Cal 17 5 - 40 mg/dL   LDL Calculated 90 0 - 99 mg/dL  TSH  Result Value Ref Range   TSH 1.400 0.450 - 4.500 uIU/mL  PSA  Result Value Ref Range   Prostate Specific Ag, Serum 2.7 0.0 - 4.0 ng/mL  Urinalysis, Routine w reflex microscopic  Result Value Ref Range   Specific Gravity, UA 1.020 1.005 - 1.030   pH, UA 7.5 5.0 - 7.5   Color, UA Yellow Yellow   Appearance Ur Clear Clear   Leukocytes, UA Negative Negative   Protein, UA Negative Negative/Trace   Glucose, UA Negative Negative   Ketones, UA Negative Negative   RBC, UA Negative Negative   Bilirubin, UA Negative Negative   Urobilinogen, Ur 1.0 0.2 - 1.0 mg/dL   Nitrite, UA Negative Negative      Assessment & Plan:   Problem List Items Addressed This Visit      Cardiovascular and Mediastinum   HTN (hypertension)    The current medical regimen is effective;  continue present plan and medications.       Relevant Medications   metoprolol tartrate (LOPRESSOR) 25 MG tablet   furosemide (LASIX) 20 MG tablet     Genitourinary   BPH without obstruction/lower urinary tract symptoms    STABLE        Other   Advanced care planning/counseling discussion    A voluntary discussion about advanced care planning including explanation and discussion of advanced directives was extentively discussed with the patient.  Explained about the healthcare proxy and living will was reviewed and packet with forms with expiration of how to fill them out was given.  Time spent: Encounter 16+ min individuals present: Patient           Follow up plan: Return in about 1 year (around 08/28/2018) for Physical Exam.

## 2017-08-27 NOTE — Assessment & Plan Note (Signed)
The current medical regimen is effective;  continue present plan and medications.  

## 2017-08-27 NOTE — Assessment & Plan Note (Signed)
STABLE

## 2017-08-27 NOTE — Assessment & Plan Note (Signed)
A voluntary discussion about advanced care planning including explanation and discussion of advanced directives was extentively discussed with the patient.  Explained about the healthcare proxy and living will was reviewed and packet with forms with expiration of how to fill them out was given.  Time spent: Encounter 16+ min individuals present: Patient 

## 2017-09-01 ENCOUNTER — Emergency Department
Admission: EM | Admit: 2017-09-01 | Discharge: 2017-09-01 | Disposition: A | Discharge status: Home / Self Care | Payer: Medicare Other | Attending: Emergency Medicine | Admitting: Emergency Medicine

## 2017-09-01 ENCOUNTER — Encounter: Payer: Self-pay | Admitting: Emergency Medicine

## 2017-09-01 DIAGNOSIS — Z79899 Other long term (current) drug therapy: Secondary | ICD-10-CM | POA: Insufficient documentation

## 2017-09-01 DIAGNOSIS — J0110 Acute frontal sinusitis, unspecified: Secondary | ICD-10-CM

## 2017-09-01 DIAGNOSIS — R0981 Nasal congestion: Secondary | ICD-10-CM | POA: Diagnosis not present

## 2017-09-01 DIAGNOSIS — I10 Essential (primary) hypertension: Secondary | ICD-10-CM | POA: Diagnosis not present

## 2017-09-01 DIAGNOSIS — Z87891 Personal history of nicotine dependence: Secondary | ICD-10-CM | POA: Insufficient documentation

## 2017-09-01 DIAGNOSIS — R51 Headache: Secondary | ICD-10-CM | POA: Diagnosis not present

## 2017-09-01 MED ORDER — LORATADINE 10 MG PO TABS
10.0000 mg | ORAL_TABLET | Freq: Once | ORAL | Status: AC
  Administered 2017-09-01: 10 mg via ORAL
  Filled 2017-09-01: qty 1

## 2017-09-01 MED ORDER — IBUPROFEN 600 MG PO TABS
600.0000 mg | ORAL_TABLET | Freq: Once | ORAL | Status: AC
  Administered 2017-09-01: 600 mg via ORAL
  Filled 2017-09-01: qty 1

## 2017-09-01 MED ORDER — PSEUDOEPHEDRINE HCL 30 MG PO TABS
60.0000 mg | ORAL_TABLET | Freq: Once | ORAL | Status: AC
  Administered 2017-09-01: 60 mg via ORAL
  Filled 2017-09-01: qty 2

## 2017-09-01 MED ORDER — OXYMETAZOLINE HCL 0.05 % NA SOLN
1.0000 | Freq: Once | NASAL | Status: AC
  Administered 2017-09-01: 1 via NASAL
  Filled 2017-09-01: qty 15

## 2017-09-01 NOTE — ED Triage Notes (Signed)
Patient states that he has had a sinus infection times 2- 3 weeks. Patient states that he was seen by his PCP on Monday and he was started on antibiotics. Patient states that he has improved some but that he is still congested.

## 2017-09-01 NOTE — Discharge Instructions (Signed)
Please use a sinus rinse or Neti Pot every day to help with your congestion.  I also recommend you get over the counter zyrted (cetirizine) and take one tablet (10mg ) by mouth twice a day Use sudafed (pseudoephedrine) 30mg  by mouth up to 4 times a day Use afrin (oxymetazoline) up to twice a day (3 days maximum) Use 600mg  ibuprofen up to three times a day as needed for headache  STOP YOUR ANTIBIOTICS.  5 DAYS IS ENOUGH.  It was a pleasure to take care of you today, and thank you for coming to our emergency department.  If you have any questions or concerns before leaving please ask the nurse to grab me and I'm more than happy to go through your aftercare instructions again.  If you were prescribed any opioid pain medication today such as Norco, Vicodin, Percocet, morphine, hydrocodone, or oxycodone please make sure you do not drive when you are taking this medication as it can alter your ability to drive safely.  If you have any concerns once you are home that you are not improving or are in fact getting worse before you can make it to your follow-up appointment, please do not hesitate to call 911 and come back for further evaluation.  Darel Hong, MD

## 2017-09-01 NOTE — ED Provider Notes (Signed)
[facility] Emergency Department Provider Note  ____________________________________________   First MD Initiated Contact with Patient 09/01/17 [phone]     (approximate)  I have reviewed the triage vital signs and the nursing notes.   HISTORY  Chief Complaint Facial Pain   HPI Derek Wright is a 72 y.o. male who self presents to the emergency department with 2 to 3 weeks of sinus congestion.  Earlier this week he was seen by his primary care physician and was given amoxicillin with only minimal relief.  He has fullness.  Sleep at night which prompted the visit.  He has not taken any antihistamines sprays.  Symptoms gradually are now moderate to severe.  They are worsened when lying down.    Past Medical History:  Diagnosis Date  . Arthritis   . GERD (gastroesophageal reflux disease)    RARE-NO MEDS  . Pre-diabetes   . Sleep apnea    MILD-NO CPAP-COULD NOT TOELERATE    Patient Active Problem List   Diagnosis Date Noted  . Palpitations 12/12/2016  . Advanced care planning/counseling discussion 09/13/2016  . Tremor, essential 09/13/2016  . Vertigo 01/19/2016  . Right inguinal hernia 09/14/2015  . Ankle edema 09/13/2015  . Hernia, inguinal, left 07/14/2015  . Arthralgia 08/12/2014  . Pre-diabetes 08/12/2014  . 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 HTN (hypertension) 07/22/2013  . Type II or unspecified type diabetes mellitus without mention of complication, not stated as uncontrolled 07/22/2013    Past Surgical History:  Procedure Laterality Date  . COLONOSCOPY  5093  . hair folicle removed    . INGUINAL HERNIA REPAIR Bilateral 09/29/2015   Procedure: LAPAROSCOPIC BILATERAL INGUINAL HERNIA REPAIR;  Surgeon: Robert Bellow, MD;  Location: [facility] ORS;  Service: General;  Laterality: Bilateral;  . SKIN GRAFT Right 1966   hand    Prior to Admission medications   Medication Sig Start Date End Date Taking? Authorizing  Provider  furosemide (LASIX) 20 MG tablet Take 1 tablet (20 mg total) by mouth daily. 08/27/17 08/27/18  Guadalupe Maple, MD  Menthol, Topical Analgesic, (ICY HOT EX) Apply 1 application topically daily as needed. For pain    [provider]  metoprolol tartrate (LOPRESSOR) 25 MG tablet Take 1 tablet (25 mg total) by mouth daily as needed. For tremor 08/27/17   Guadalupe Maple, MD  naproxen sodium (ALEVE) 220 MG tablet Take 220 mg by mouth as needed.    [provider]  omeprazole (PRILOSEC) 20 MG capsule Take 1 capsule (20 mg total) by mouth daily. 08/27/17   Guadalupe Maple, MD    Allergies Patient has no known allergies.  Family History  Problem Relation Age of Onset  . Ovarian cancer Mother   . Heart disease Father   . Lymphoma Maternal Uncle     Social History Social History   Tobacco Use  . Smoking status: Former Smoker    Packs/day: 0.25    Years: 15.00    Pack years: 3.75    Types: Cigarettes    Last attempt to quit: 09/13/1980    Years since quitting: 36.9  . Smokeless tobacco: Never Used  Substance Use Topics  . Alcohol use: Yes    Alcohol/week: 1.2 oz    Types: 2 Cans of beer per week  . Drug use: No    Review of Systems Constitutional: No fever/chills ENT: Positive for sinus congestion Cardiovascular: Denies chest pain. Respiratory: Denies shortness of breath. Musculoskeletal: Negative for  back pain. Neurological: Positive for headaches   ____________________________________________   PHYSICAL EXAM:  VITAL SIGNS: ED Triage Vitals [09/01/17 0313]  Enc Vitals Group     BP 137/77     Pulse Rate 99     Resp 18     Temp 98.6 F (37 C)     Temp Source Oral     SpO2 100 %     Weight 277 lb (125.6 kg)     Height 6\' 5"  (1.956 m)     Head Circumference      Peak Flow      Pain Score 10     Pain Loc      Pain Edu?      Excl. in [place]?     Constitutional: Alert and oriented x4 somewhat anxious appearing nontoxic no diaphoresis Head:  Atraumatic.  Frontal sinus tenderness Nose: Positive congestion Mouth/Throat: No trismus Neck: No stridor.  No meningismus Cardiovascular: Regular rate and rhythm Respiratory: Normal respiratory effort.  No retractions. Gastrointestinal: Soft nontender Neurologic:  Normal speech and language. No gross focal neurologic deficits are appreciated.  Skin:  Skin is warm, dry and intact. No rash noted.    ____________________________________________  LABS (all labs ordered are listed, but only abnormal results are displayed)  Labs Reviewed - No data to display   __________________________________________  EKG   ____________________________________________  RADIOLOGY   ____________________________________________   DIFFERENTIAL includes but not limited to  Sinus congestion, upper respiratory tract infection, allergic rhinitis, sinusitis   PROCEDURES  Procedure(s) performed: no  Procedures  Critical Care performed: no  ____________________________________________   INITIAL IMPRESSION / ASSESSMENT AND PLAN / ED COURSE  Pertinent labs & imaging results that were available during my care of the patient were reviewed by me and considered in my medical decision making (see chart for details).   Patient arrives hemodynamically stable and well-appearing.  We had a lengthy discussion regarding the futility of antibiotics for sinus congestion and the importance of using oxygen metolazone versus nasal's along with antihistamines.  Given first doses here with improvement in symptoms.  I have encouraged the patient to stop his antibiotics to begin using symptomatic treatment instead.  He is discharged home in improved condition.      ____________________________________________   FINAL CLINICAL IMPRESSION(S) / ED DIAGNOSES  Final diagnoses:  Sinus congestion  Acute non-recurrent frontal sinusitis      NEW MEDICATIONS STARTED DURING THIS VISIT:  Discharge Medication  List as of 09/01/2017  3:55 AM       Note:  This document was prepared using Dragon voice recognition software and may include unintentional dictation errors.      Darel Hong, MD 09/02/17 [phone]

## 2017-09-02 ENCOUNTER — Emergency Department
Admission: EM | Admit: 2017-09-02 | Discharge: 2017-09-02 | Disposition: A | Discharge status: Home / Self Care | Payer: Medicare Other | Attending: Student in an Organized Health Care Education/Training Program | Admitting: Student in an Organized Health Care Education/Training Program

## 2017-09-02 ENCOUNTER — Other Ambulatory Visit: Payer: Self-pay

## 2017-09-02 ENCOUNTER — Encounter: Payer: Self-pay | Admitting: Emergency Medicine

## 2017-09-02 DIAGNOSIS — R0981 Nasal congestion: Secondary | ICD-10-CM | POA: Diagnosis not present

## 2017-09-02 DIAGNOSIS — Z79899 Other long term (current) drug therapy: Secondary | ICD-10-CM | POA: Insufficient documentation

## 2017-09-02 DIAGNOSIS — E119 Type 2 diabetes mellitus without complications: Secondary | ICD-10-CM | POA: Diagnosis not present

## 2017-09-02 DIAGNOSIS — J3489 Other specified disorders of nose and nasal sinuses: Secondary | ICD-10-CM | POA: Diagnosis not present

## 2017-09-02 DIAGNOSIS — I10 Essential (primary) hypertension: Secondary | ICD-10-CM | POA: Insufficient documentation

## 2017-09-02 DIAGNOSIS — Z87891 Personal history of nicotine dependence: Secondary | ICD-10-CM | POA: Insufficient documentation

## 2017-09-02 DIAGNOSIS — J019 Acute sinusitis, unspecified: Secondary | ICD-10-CM | POA: Insufficient documentation

## 2017-09-02 MED ORDER — OXYMETAZOLINE HCL 0.05 % NA SOLN
1.0000 | Freq: Once | NASAL | Status: AC
  Administered 2017-09-02: 1 via NASAL
  Filled 2017-09-02: qty 15

## 2017-09-02 MED ORDER — LIDOCAINE HCL (PF) 1 % IJ SOLN
5.0000 mL | Freq: Once | INTRAMUSCULAR | Status: AC
  Administered 2017-09-02: 5 mL via INTRADERMAL
  Filled 2017-09-02: qty 5

## 2017-09-02 MED ORDER — NAPROXEN 500 MG PO TABS
500.0000 mg | ORAL_TABLET | Freq: Once | ORAL | Status: AC
  Administered 2017-09-02: 500 mg via ORAL
  Filled 2017-09-02: qty 1

## 2017-09-02 NOTE — ED Notes (Signed)
Pt requesting that this RN call wife and let her know where patient is. No answer, no option to leave message.

## 2017-09-02 NOTE — ED Provider Notes (Signed)
[facility] Emergency Department Provider Note    First MD Initiated Contact with Patient 09/02/17 [phone]     (approximate)  I have reviewed the triage vital signs and the nursing notes.   HISTORY  Chief Complaint Sinusitis    HPI Derek Wright is a 72 y.o. male presents with 3 weeks of progressively worsening nasal congestion and nasal pain.  Patient states she is having difficulty sleeping.  Was recently started on Augmentin for presumed sinusitis without much relief.  Patient was seen in the ER yesterday and given a prescription for symptomatic relief but is failing to improve.  Denies any fevers at home.  Denies any numbness or tingling.  Is never had symptoms like this before.  Denies any history of allergies.    Past Medical History:  Diagnosis Date  . Arthritis   . GERD (gastroesophageal reflux disease)    RARE-NO MEDS  . Pre-diabetes   . Sleep apnea    MILD-NO CPAP-COULD NOT TOELERATE   Family History  Problem Relation Age of Onset  . Ovarian cancer Mother   . Heart disease Father   . Lymphoma Maternal Uncle    Past Surgical History:  Procedure Laterality Date  . COLONOSCOPY  7510  . hair folicle removed    . INGUINAL HERNIA REPAIR Bilateral 09/29/2015   Procedure: LAPAROSCOPIC BILATERAL INGUINAL HERNIA REPAIR;  Surgeon: Robert Bellow, MD;  Location: [facility] ORS;  Service: General;  Laterality: Bilateral;  . SKIN GRAFT Right 1966   hand   Patient Active Problem List   Diagnosis Date Noted  . Palpitations 12/12/2016  . Advanced care planning/counseling discussion 09/13/2016  . Tremor, essential 09/13/2016  . Vertigo 01/19/2016  . Right inguinal hernia 09/14/2015  . Ankle edema 09/13/2015  . Hernia, inguinal, left 07/14/2015  . Arthralgia 08/12/2014  . Pre-diabetes 08/12/2014  . 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 HTN (hypertension) 07/22/2013  . Type II or unspecified type diabetes mellitus without  mention of complication, not stated as uncontrolled 07/22/2013      Prior to Admission medications   Medication Sig Start Date End Date Taking? Authorizing Provider  furosemide (LASIX) 20 MG tablet Take 1 tablet (20 mg total) by mouth daily. 08/27/17 08/27/18  Guadalupe Maple, MD  Menthol, Topical Analgesic, (ICY HOT EX) Apply 1 application topically daily as needed. For pain    [provider]  metoprolol tartrate (LOPRESSOR) 25 MG tablet Take 1 tablet (25 mg total) by mouth daily as needed. For tremor 08/27/17   Guadalupe Maple, MD  naproxen sodium (ALEVE) 220 MG tablet Take 220 mg by mouth as needed.    [provider]  omeprazole (PRILOSEC) 20 MG capsule Take 1 capsule (20 mg total) by mouth daily. 08/27/17   Guadalupe Maple, MD    Allergies Patient has no known allergies.    Social History Social History   Tobacco Use  . Smoking status: Former Smoker    Packs/day: 0.25    Years: 15.00    Pack years: 3.75    Types: Cigarettes    Last attempt to quit: 09/13/1980    Years since quitting: 36.9  . Smokeless tobacco: Never Used  Substance Use Topics  . Alcohol use: Yes    Alcohol/week: 1.2 oz    Types: 2 Cans of beer per week  . Drug use: No    Review of Systems Patient denies headaches, rhinorrhea, blurry vision, numbness, shortness of breath, chest pain,  edema, cough, abdominal pain, nausea, vomiting, diarrhea, dysuria, fevers, rashes or hallucinations unless otherwise stated above in HPI. ____________________________________________   PHYSICAL EXAM:  VITAL SIGNS: Vitals:   09/02/17 0739  BP: (!) 149/79  Pulse: 96  Resp: 18  Temp: 98.5 F (36.9 C)  SpO2: 97%    Constitutional: Alert and oriented.  Eyes: Conjunctivae are normal.  Head: Atraumatic. Nose: + congestion, left turbinate is erythematous and swollen. Mouth/Throat: Mucous membranes are moist.   Neck: No stridor. Painless ROM.  Cardiovascular: Normal rate, regular rhythm. Grossly  normal heart sounds.  Good peripheral circulation. Respiratory: Normal respiratory effort.  No retractions. Lungs CTAB. Gastrointestinal: Soft and nontender. No distention. No abdominal bruits. No CVA tenderness. Genitourinary: deferred Musculoskeletal: No lower extremity tenderness nor edema.  No joint effusions. Neurologic:  Normal speech and language. No gross focal neurologic deficits are appreciated. No facial droop Skin:  Skin is warm, dry and intact. No rash noted. Psychiatric: Mood and affect are normal. Speech and behavior are normal.  ____________________________________________   LABS (all labs ordered are listed, but only abnormal results are displayed)  No results found for this or any previous visit (from the past 24 hour(s)). ____________________________________________ ____________________________________________  [id number]   ____________________________________________   PROCEDURES  Procedure(s) performed:  Procedures    Critical Care performed: no ____________________________________________   INITIAL IMPRESSION / ASSESSMENT AND PLAN / ED COURSE  Pertinent labs & imaging results that were available during my care of the patient were reviewed by me and considered in my medical decision making (see chart for details).   DDX: sinusitis, uri, mass, abscess, mucor  Derek Wright is a 72 y.o. who presents to the ED with symptoms as described above.  Does have boggy erythematous left turbinates with significant congestion.  Presentation most clinically consistent with sinusitis.  No mass identified.  Will trial symptomatic management.  He is afebrile with no focal deficits therefore do not feel this is clinically consistent with deep space infection or abscess.  Clinical Course as of Sep 02 852  Sun Sep 02, 2017  0851 Patient reassessed.  After Afrin spray and topical lidocaine had significant improvement in symptoms.  I can see improvement in the swelling and  erythema of the left turbinate.  Patient feels significantly improved.  He has no focal neuro deficits.  Do not feel that CT imaging clinically indicated at this time.  He is afebrile and with no neuro deficits and well-appearing patient I do not feel this is clinically consistent with abscess or deep space infection.  Do believe patient stable for continued symptomatic management follow-up with PCP and referral to ear nose and throat.  Have discussed with the patient and available family all diagnostics and treatments performed thus far and all questions were answered to the best of my ability. The patient demonstrates understanding and agreement with plan.    [PR]    Clinical Course User Index [PR] Merlyn Lot, MD     As part of my medical decision making, I reviewed the following data within the [place] notes reviewed and incorporated, Labs reviewed, notes from prior ED visits.   ____________________________________________   FINAL CLINICAL IMPRESSION(S) / ED DIAGNOSES  Final diagnoses:  Acute sinusitis, recurrence not specified, unspecified location      NEW MEDICATIONS STARTED DURING THIS VISIT:  New Prescriptions   No medications on file     Note:  This document was prepared using Dragon voice recognition software and may include unintentional  dictation errors.     Merlyn Lot, MD 09/02/17 [phone]

## 2017-09-02 NOTE — ED Triage Notes (Addendum)
Pt c/o unable to sleep due to sinus pain and pressure, states he had prescriptions filled that were prescribed to him yesterday morning but states nothing is helping.   Pt has had sxs for 2-3 weeks.  Pt was taking antibiotics but was told to stop them.

## 2017-09-02 NOTE — ED Notes (Signed)
Pt alert and oriented X4, active, cooperative, pt in NAD. RR even and unlabored, color WNL.  Pt informed to return if any life threatening symptoms occur.  Discharge and followup instructions reviewed.  

## 2017-09-11 DIAGNOSIS — I208 Other forms of angina pectoris: Secondary | ICD-10-CM | POA: Diagnosis not present

## 2017-09-11 DIAGNOSIS — R0602 Shortness of breath: Secondary | ICD-10-CM | POA: Diagnosis not present

## 2017-09-11 DIAGNOSIS — G4733 Obstructive sleep apnea (adult) (pediatric): Secondary | ICD-10-CM | POA: Diagnosis not present

## 2017-09-11 DIAGNOSIS — R011 Cardiac murmur, unspecified: Secondary | ICD-10-CM | POA: Diagnosis not present

## 2017-10-31 ENCOUNTER — Other Ambulatory Visit: Payer: Self-pay | Admitting: Family Medicine

## 2018-02-07 ENCOUNTER — Telehealth: Payer: Self-pay

## 2018-02-07 NOTE — Telephone Encounter (Signed)
Pt is on metric gap list for no statin treatment in patients with diabetes and/or cardiovascular disease. Please review and decide if appropriate to start statin.

## 2018-07-25 ENCOUNTER — Telehealth: Payer: Self-pay | Admitting: Family Medicine

## 2018-07-25 NOTE — Chronic Care Management (AMB) (Signed)
Chronic Care Management   Note  07/25/2018 Name: Derek Wright MRN: [id number] DOB: Sep 01, 1945  Derek Wright is a 73 y.o. year old male who is a primary care patient of Crissman, Jeannette How, MD. I reached out to [name] by phone today in response to a referral sent by Derek Wright's health plan.    Derek Wright was given information about Chronic Care Management services today including:  1. CCM service includes personalized support from designated clinical staff supervised by his physician, including individualized plan of care and coordination with other care providers 2. 24/7 contact phone numbers for assistance for urgent and routine care needs. 3. Service will only be billed when office clinical staff spend 20 minutes or more in a month to coordinate care. 4. Only one practitioner may furnish and bill the service in a calendar month. 5. The patient may stop CCM services at any time (effective at the end of the month) by phone call to the office staff. 6. The patient will be responsible for cost sharing (co-pay) of up to 20% of the service fee (after annual deductible is met).  Patient did not agree to enrollment in care management services and does not wish to consider at this time.  Follow up plan: The care management team is available to follow up with the patient after provider conversation with the patient regarding recommendation for care management engagement and subsequent re-referral to the care management team.   [place]  [email or web address]   ??[phone]

## 2018-08-22 ENCOUNTER — Ambulatory Visit: Payer: Medicare Other

## 2018-08-27 DIAGNOSIS — R011 Cardiac murmur, unspecified: Secondary | ICD-10-CM | POA: Diagnosis not present

## 2018-08-27 DIAGNOSIS — G4733 Obstructive sleep apnea (adult) (pediatric): Secondary | ICD-10-CM | POA: Diagnosis not present

## 2018-08-27 DIAGNOSIS — R0602 Shortness of breath: Secondary | ICD-10-CM | POA: Diagnosis not present

## 2018-08-27 DIAGNOSIS — I208 Other forms of angina pectoris: Secondary | ICD-10-CM | POA: Diagnosis not present

## 2018-09-03 ENCOUNTER — Encounter: Payer: Medicare Other | Admitting: Family Medicine

## 2018-10-09 ENCOUNTER — Ambulatory Visit (INDEPENDENT_AMBULATORY_CARE_PROVIDER_SITE_OTHER): Payer: Medicare Other

## 2018-10-09 ENCOUNTER — Encounter: Payer: Self-pay | Admitting: Nurse Practitioner

## 2018-10-09 ENCOUNTER — Other Ambulatory Visit: Payer: Self-pay

## 2018-10-09 ENCOUNTER — Ambulatory Visit (INDEPENDENT_AMBULATORY_CARE_PROVIDER_SITE_OTHER): Payer: Medicare Other | Admitting: Nurse Practitioner

## 2018-10-09 VITALS — BP 125/82 | HR 76 | Temp 98.3°F | Wt 277.0 lb

## 2018-10-09 VITALS — BP 125/82 | HR 76 | Temp 98.3°F | Ht 73.75 in | Wt 277.0 lb

## 2018-10-09 DIAGNOSIS — I10 Essential (primary) hypertension: Secondary | ICD-10-CM | POA: Diagnosis not present

## 2018-10-09 DIAGNOSIS — M25473 Effusion, unspecified ankle: Secondary | ICD-10-CM | POA: Diagnosis not present

## 2018-10-09 DIAGNOSIS — R7303 Prediabetes: Secondary | ICD-10-CM | POA: Diagnosis not present

## 2018-10-09 DIAGNOSIS — Z0000 Encounter for general adult medical examination without abnormal findings: Secondary | ICD-10-CM

## 2018-10-09 DIAGNOSIS — Z23 Encounter for immunization: Secondary | ICD-10-CM

## 2018-10-09 DIAGNOSIS — N400 Enlarged prostate without lower urinary tract symptoms: Secondary | ICD-10-CM

## 2018-10-09 LAB — MICROALBUMIN, URINE WAIVED
Creatinine, Urine Waived: 100 mg/dL (ref 10–300)
Microalb, Ur Waived: 10 mg/L (ref 0–19)
Microalb/Creat Ratio: <30 mg/g (ref <30)

## 2018-10-09 MED ORDER — SHINGRIX 50 MCG/0.5ML IM SUSR: 0.5000 mL | Freq: Once | INTRAMUSCULAR | 0 refills | Status: AC

## 2018-10-09 NOTE — Assessment & Plan Note (Signed)
Continue Lasix and recommend use of compression hose.

## 2018-10-09 NOTE — Patient Instructions (Addendum)

## 2018-10-09 NOTE — Assessment & Plan Note (Signed)
Check A1C today and continue diet focus.  If elevation consider initiation of medication.   

## 2018-10-09 NOTE — Assessment & Plan Note (Signed)
Chronic, stable with BP below goal.  Continue current medication regimen + collaboration with cardiology and recommend checking BP daily at home.  Labs today.

## 2018-10-09 NOTE — Progress Notes (Signed)
BP 125/82   Pulse 76   Temp 98.3 F (36.8 C) (Oral)   Ht 6' 1.75" (1.873 m)   Wt 277 lb (125.6 kg)   SpO2 97%   BMI 35.81 kg/m    Subjective:    [name], male    DOB: 1945/08/20, 73 y.o.   MRN: [id number]  HPI: Derek Wright is a 73 y.o. male presenting on 10/09/2018 for comprehensive medical examination. Current medical complaints include:none  He currently lives with: wife Interim Problems from his last visit: no   HYPERTENSION Followed by Dr. Clayborn Bigness and last seen 08/27/2018, no changes made on review of note. Continues on Metoprolol XL and Lasix (has edema at baseline BLE).  Does endorse not always following low sodium diet at home. Hypertension status: stable  Satisfied with current treatment? yes Duration of hypertension: chronic BP monitoring frequency:  not checking BP range:  BP medication side effects:  no Medication compliance: good compliance Aspirin: no Recurrent headaches: no Visual changes: no Palpitations: no Dyspnea: no Chest pain: no Lower extremity edema: yes, at baseline, no worse Dizzy/lightheaded: no   PREDIABETES: Last A1C in 2017 6.2%.  He is currently diet controlled.  Does not check BS at home. Polydipsia/polyuria: no Visual disturbance: no Chest pain: no Paresthesias: no   Functional Status Survey: Is the patient deaf or have difficulty hearing?: No Does the patient have difficulty seeing, even when wearing glasses/contacts?: No Does the patient have difficulty concentrating, remembering, or making decisions?: No Does the patient have difficulty walking or climbing stairs?: No Does the patient have difficulty dressing or bathing?: No Does the patient have difficulty doing errands alone such as visiting a doctor's office or shopping?: No  FALL RISK: Fall Risk  10/09/2018 08/20/2017 12/12/2016 09/13/2016 08/17/2016  Falls in the past year? 0 Yes No Yes Yes  Number falls in past yr: 0 1 - 1 1  Injury with Fall? 0 No - No No   Follow up Falls evaluation completed Falls evaluation completed - - -    Depression Screen Depression screen [facility] 2/9 10/09/2018 08/20/2017 12/12/2016 09/13/2016 08/17/2016  Decreased Interest 0 0 0 0 0  Down, Depressed, Hopeless 0 0 0 0 0  PHQ - 2 Score 0 0 0 0 0  Altered sleeping 1 - - - -  Tired, decreased energy 1 - - - -  Change in appetite 0 - - - -  Feeling bad or failure about yourself  0 - - - -  Trouble concentrating 0 - - - -  Moving slowly or fidgety/restless 0 - - - -  Suicidal thoughts 0 - - - -  PHQ-9 Score 2 - - - -  Difficult doing work/chores Not difficult at all - - - -    Advanced Directives <no information>  Past Medical History:  Past Medical History:  Diagnosis Date  . Arthritis   . GERD (gastroesophageal reflux disease)    RARE-NO MEDS  . Pre-diabetes   . Sleep apnea    MILD-NO CPAP-COULD NOT TOELERATE    Surgical History:  Past Surgical History:  Procedure Laterality Date  . COLONOSCOPY  6256  . hair folicle removed    . INGUINAL HERNIA REPAIR Bilateral 09/29/2015   Procedure: LAPAROSCOPIC BILATERAL INGUINAL HERNIA REPAIR;  Surgeon: Robert Bellow, MD;  Location: [facility] ORS;  Service: General;  Laterality: Bilateral;  . SKIN GRAFT Right 1966   hand    Medications:  Current Outpatient Medications on File  Prior to Visit  Medication Sig  . Menthol, Topical Analgesic, (ICY HOT EX) Apply 1 application topically daily as needed. For pain  . metoprolol succinate (TOPROL-XL) 25 MG 24 hr tablet TAKE ONE TABLET DAILY AS NEEDED.  . naproxen sodium (ALEVE) 220 MG tablet Take 220 mg by mouth as needed.  Marland Kitchen omeprazole (PRILOSEC) 20 MG capsule Take 1 capsule (20 mg total) by mouth daily.  . furosemide (LASIX) 20 MG tablet Take 1 tablet (20 mg total) by mouth daily.   No current facility-administered medications on file prior to visit.     Allergies:  No Known Allergies  Social History:  Social History   Socioeconomic History  . Marital status: Married     Spouse name: Not on file  . Number of children: Not on file  . Years of education: 46  . Highest education level: High school graduate  Occupational History  . Not on file  Social Needs  . Financial resource strain: Not hard at all  . Food insecurity    Worry: Never true    Inability: Never true  . Transportation needs    Medical: No    Non-medical: No  Tobacco Use  . Smoking status: Former Smoker    Packs/day: 0.25    Years: 15.00    Pack years: 3.75    Types: Cigarettes    Quit date: 09/13/1980    Years since quitting: 38.0  . Smokeless tobacco: Never Used  Substance and Sexual Activity  . Alcohol use: Yes    Alcohol/week: 2.0 standard drinks    Types: 2 Cans of beer per week  . Drug use: No  . Sexual activity: Not on file  Lifestyle  . Physical activity    Days per week: 0 days    Minutes per session: 0 min  . Stress: Not at all  Relationships  . Social connections    Talks on phone: More than three times a week    Gets together: More than three times a week    Attends religious service: More than 4 times per year    Active member of club or organization: Yes    Attends meetings of clubs or organizations: More than 4 times per year    Relationship status: Married  . Intimate partner violence    Fear of current or ex partner: No    Emotionally abused: No    Physically abused: No    Forced sexual activity: No  Other Topics Concern  . Not on file  Social History Narrative  . Not on file   Social History   Tobacco Use  Smoking Status Former Smoker  . Packs/day: 0.25  . Years: 15.00  . Pack years: 3.75  . Types: Cigarettes  . Quit date: 09/13/1980  . Years since quitting: 38.0  Smokeless Tobacco Never Used   Social History   Substance and Sexual Activity  Alcohol Use Yes  . Alcohol/week: 2.0 standard drinks  . Types: 2 Cans of beer per week    Family History:  Family History  Problem Relation Age of Onset  . Ovarian cancer Mother   . Heart  disease Father   . Lymphoma Maternal Uncle     Past medical history, surgical history, medications, allergies, family history and social history reviewed with patient today and changes made to appropriate areas of the chart.   Review of Systems - negative All other ROS negative except what is listed above and in the HPI.  Objective:    BP 125/82   Pulse 76   Temp 98.3 F (36.8 C) (Oral)   Ht 6' 1.75" (1.873 m)   Wt 277 lb (125.6 kg)   SpO2 97%   BMI 35.81 kg/m   Wt Readings from Last 3 Encounters:  10/09/18 277 lb (125.6 kg)  09/02/17 277 lb (125.6 kg)  09/01/17 277 lb (125.6 kg)    Physical Exam Vitals signs and nursing note reviewed.  Constitutional:      General: He is awake. He is not in acute distress.    Appearance: He is well-developed. He is obese. He is not ill-appearing.  HENT:     Head: Normocephalic and atraumatic.     Right Ear: Hearing, tympanic membrane, ear canal and external ear normal. No drainage.     Left Ear: Hearing, tympanic membrane, ear canal and external ear normal. No drainage.     Nose: Nose normal.     Mouth/Throat:     Pharynx: Oropharynx is clear. Uvula midline.  Eyes:     General: Lids are normal.        Right eye: No discharge.        Left eye: No discharge.     Extraocular Movements: Extraocular movements intact.     Conjunctiva/sclera: Conjunctivae normal.     Pupils: Pupils are equal, round, and reactive to light.     Visual Fields: Right eye visual fields normal and left eye visual fields normal.  Neck:     Musculoskeletal: Normal range of motion and neck supple.     Thyroid: No thyromegaly.     Vascular: No carotid bruit.  Cardiovascular:     Rate and Rhythm: Normal rate and regular rhythm.     Heart sounds: Normal heart sounds, S1 normal and S2 normal. No murmur. No gallop.   Pulmonary:     Effort: Pulmonary effort is normal. No accessory muscle usage or respiratory distress.     Breath sounds: Normal breath sounds.   Abdominal:     General: Bowel sounds are normal.     Palpations: Abdomen is soft. There is no hepatomegaly or splenomegaly.     Tenderness: There is no abdominal tenderness.     Hernia: There is no hernia in the left inguinal area or right inguinal area.  Genitourinary:    Penis: Normal.      Scrotum/Testes: Normal.  Musculoskeletal: Normal range of motion.     Right lower leg: 2+ Pitting Edema present.     Left lower leg: 2+ Pitting Edema present.  Lymphadenopathy:     Head:     Right side of head: No submental, submandibular, tonsillar, preauricular or posterior auricular adenopathy.     Left side of head: No submental, submandibular, tonsillar, preauricular or posterior auricular adenopathy.     Cervical: No cervical adenopathy.  Skin:    General: Skin is warm and dry.     Capillary Refill: Capillary refill takes less than 2 seconds.     Findings: No rash.  Neurological:     Mental Status: He is alert and oriented to person, place, and time.     Cranial Nerves: Cranial nerves are intact.     Gait: Gait is intact.     Deep Tendon Reflexes:     Reflex Scores:      Brachioradialis reflexes are 1+ on the right side and 1+ on the left side.      Patellar reflexes are 1+ on the right side and  1+ on the left side. Psychiatric:        Attention and Perception: Attention normal.        Mood and Affect: Mood normal.        Speech: Speech normal.        Behavior: Behavior normal. Behavior is cooperative.        Thought Content: Thought content normal.        Cognition and Memory: Cognition normal.        Judgment: Judgment normal.    6CIT Screen 10/09/2018 08/20/2017 08/17/2016  What Year? 0 points 0 points 0 points  What month? 0 points 0 points 0 points  What time? 0 points 0 points 0 points  Count back from 20 0 points 0 points 0 points  Months in reverse 0 points 0 points 0 points  Repeat phrase 0 points 0 points 0 points  Total Score 0 0 0     Diabetic Foot Exam - Simple    Simple Foot Form Visual Inspection No deformities, no ulcerations, no other skin breakdown bilaterally: Yes Sensation Testing Intact to touch and monofilament testing bilaterally: Yes Pulse Check Posterior Tibialis and Dorsalis pulse intact bilaterally: Yes Comments     Results for orders placed or performed in visit on 08/20/17  CBC with Differential  Result Value Ref Range   WBC 6.0 3.4 - 10.8 x10E3/uL   RBC 4.31 4.14 - 5.80 x10E6/uL   Hemoglobin 13.7 13.0 - 17.7 g/dL   Hematocrit 39.7 37.5 - 51.0 %   MCV 92 79 - 97 fL   MCH 31.8 26.6 - 33.0 pg   MCHC 34.5 31.5 - 35.7 g/dL   RDW 14.4 12.3 - 15.4 %   Platelets 244 150 - 450 x10E3/uL   Neutrophils 60 Not Estab. %   Lymphs 24 Not Estab. %   Monocytes 12 Not Estab. %   Eos 3 Not Estab. %   Basos 1 Not Estab. %   Neutrophils Absolute 3.6 1.4 - 7.0 x10E3/uL   Lymphocytes Absolute 1.4 0.7 - 3.1 x10E3/uL   Monocytes Absolute 0.7 0.1 - 0.9 x10E3/uL   EOS (ABSOLUTE) 0.2 0.0 - 0.4 x10E3/uL   Basophils Absolute 0.0 0.0 - 0.2 x10E3/uL   Immature Granulocytes 0 Not Estab. %   Immature Grans (Abs) 0.0 0.0 - 0.1 x10E3/uL  Comp Met (CMET)  Result Value Ref Range   Glucose 100 (H) 65 - 99 mg/dL   BUN 16 8 - 27 mg/dL   Creatinine, Ser 0.88 0.76 - 1.27 mg/dL   GFR calc non Af Amer 87 >59 mL/min/1.73   GFR calc Af Amer 101 >59 mL/min/1.73   BUN/Creatinine Ratio 18 10 - 24   Sodium 143 134 - 144 mmol/L   Potassium 4.7 3.5 - 5.2 mmol/L   Chloride 105 96 - 106 mmol/L   CO2 24 20 - 29 mmol/L   Calcium 9.1 8.6 - 10.2 mg/dL   Total Protein 6.6 6.0 - 8.5 g/dL   Albumin 3.9 3.5 - 4.8 g/dL   Globulin, Total 2.7 1.5 - 4.5 g/dL   Albumin/Globulin Ratio 1.4 1.2 - 2.2   Bilirubin Total 0.5 0.0 - 1.2 mg/dL   Alkaline Phosphatase 65 39 - 117 IU/L   AST 15 0 - 40 IU/L   ALT 14 0 - 44 IU/L  Lipid Panel w/o Chol/HDL Ratio  Result Value Ref Range   Cholesterol, Total 157 100 - 199 mg/dL   Triglycerides 83 0 - 149 mg/dL   HDL  50 >39 mg/dL    VLDL Cholesterol Cal 17 5 - 40 mg/dL   LDL Calculated 90 0 - 99 mg/dL  TSH  Result Value Ref Range   TSH 1.400 0.450 - 4.500 uIU/mL  PSA  Result Value Ref Range   Prostate Specific Ag, Serum 2.7 0.0 - 4.0 ng/mL  Urinalysis, Routine w reflex microscopic  Result Value Ref Range   Specific Gravity, UA 1.020 1.005 - 1.030   pH, UA 7.5 5.0 - 7.5   Color, UA Yellow Yellow   Appearance Ur Clear Clear   Leukocytes, UA Negative Negative   Protein, UA Negative Negative/Trace   Glucose, UA Negative Negative   Ketones, UA Negative Negative   RBC, UA Negative Negative   Bilirubin, UA Negative Negative   Urobilinogen, Ur 1.0 0.2 - 1.0 mg/dL   Nitrite, UA Negative Negative      Assessment & Plan:   Problem List Items Addressed This Visit      Cardiovascular and Mediastinum   HTN (hypertension)    Chronic, stable with BP below goal.  Continue current medication regimen + collaboration with cardiology and recommend checking BP daily at home.  Labs today.      Relevant Orders   Comprehensive metabolic panel     Genitourinary   BPH without obstruction/lower urinary tract symptoms    Chronic, stable without medication at this time.  PSA today.      Relevant Orders   PSA     Other   Pre-diabetes    Check A1C today and continue diet focus.  If elevation consider initiation of medication.      Relevant Orders   Microalbumin, Urine Waived   Ankle edema    Continue Lasix and recommend use of compression hose.       Other Visit Diagnoses    Encounter for annual physical exam    -  Primary   Relevant Orders   CBC with Differential/Platelet   Lipid Panel w/o Chol/HDL Ratio   PSA   TSH   Need for influenza vaccination       Relevant Orders   Flu Vaccine QUAD High Dose(Fluad) (Completed)   HgB A1c       Discussed aspirin prophylaxis for myocardial infarction prevention and decision was made to continue ASA  LABORATORY TESTING:  Health maintenance labs ordered today as  discussed above.   The natural history of prostate cancer and ongoing controversy regarding screening and potential treatment outcomes of prostate cancer has been discussed with the patient. The meaning of a false positive PSA and a false negative PSA has been discussed. He indicates understanding of the limitations of this screening test and wishes to proceed with screening PSA testing.   IMMUNIZATIONS:   - Tdap: Tetanus vaccination status reviewed: last tetanus booster within 10 years. - Influenza: Up to date - Pneumovax: Up to date - Prevnar: Up to date - Zostavax vaccine: Up to date  SCREENING: - Colonoscopy: Up to date  Discussed with patient purpose of the colonoscopy is to detect colon cancer at curable precancerous or early stages   - AAA Screening: Not applicable  -Hearing Test: Not applicable  -Spirometry: Not applicable   PATIENT COUNSELING:    Sexuality: Discussed sexually transmitted diseases, partner selection, use of condoms, avoidance of unintended pregnancy  and contraceptive alternatives.   Advised to avoid cigarette smoking.  I discussed with the patient that most people either abstain from alcohol or drink within safe limits (<=14/week and <=4 drinks/occasion  for males, <=7/weeks and <= 3 drinks/occasion for females) and that the risk for alcohol disorders and other health effects rises proportionally with the number of drinks per week and how often a drinker exceeds daily limits.  Discussed cessation/primary prevention of drug use and availability of treatment for abuse.   Diet: Encouraged to adjust caloric intake to maintain  or achieve ideal body weight, to reduce intake of dietary saturated fat and total fat, to limit sodium intake by avoiding high sodium foods and not adding table salt, and to maintain adequate dietary potassium and calcium preferably from fresh fruits, vegetables, and low-fat dairy products.    stressed the importance of regular exercise   Injury prevention: Discussed safety belts, safety helmets, smoke detector, smoking near bedding or upholstery.   Dental health: Discussed importance of regular tooth brushing, flossing, and dental visits.   Follow up plan: NEXT PREVENTATIVE PHYSICAL DUE IN 1 YEAR. Return in about 6 months (around 04/11/2019) for HTN, Arthritis, Prediabetes.

## 2018-10-09 NOTE — Assessment & Plan Note (Signed)
Chronic, stable without medication at this time.  PSA today. 

## 2018-10-09 NOTE — Progress Notes (Addendum)
Subjective:   Derek Wright is a 73 y.o. male who presents for Medicare Annual/Subsequent preventive examination.  This visit is being conducted via phone call  - after an attmept to do on video chat - due to the COVID-19 pandemic. This patient has given me verbal consent via phone to conduct this visit, patient states they are participating from their home address. Some vital signs may be absent or patient reported.   Patient identification: identified by name, DOB, and current address.    Review of Systems:   Cardiac Risk Factors include: advanced age (>41men, >1 women);male gender;hypertension;dyslipidemia;obesity (BMI >30kg/m2)     Objective:    Vitals: BP 125/82   Pulse 76   Temp 98.3 F (36.8 C)   Wt 277 lb (125.6 kg)   BMI 35.81 kg/m   Body mass index is 35.81 kg/m.  Advanced Directives 09/02/2017 09/01/2017 08/20/2017 08/17/2016 02/29/2016 01/01/2016  Does Patient Have a Medical Advance Directive? Yes Yes Yes Yes Yes Yes  Type of Paramedic of [place];Living will - Living will;Healthcare Power of [place];Living will Living will [place];Living will  Does patient want to make changes to medical advance directive? - - - - - No - Patient declined  Copy of [place] in Chart? No - copy requested - No - copy requested No - copy requested - No - copy requested    Tobacco Social History   Tobacco Use  Smoking Status Former Smoker  . Packs/day: 0.25  . Years: 15.00  . Pack years: 3.75  . Types: Cigarettes  . Quit date: 09/13/1980  . Years since quitting: 38.0  Smokeless Tobacco Never Used     Counseling given: Not Answered   Clinical Intake:  Pre-visit preparation completed: Yes  Pain : No/denies pain     Nutritional Status: BMI > 30  Obese Nutritional Risks: None Diabetes: No  How often do you need to have someone help you when you read instructions, pamphlets, or other  written materials from your doctor or pharmacy?: 1 - Never  Interpreter Needed?: No  Information entered by :: Daneisha Surges,LPN  Past Medical History:  Diagnosis Date  . Arthritis   . GERD (gastroesophageal reflux disease)    RARE-NO MEDS  . Pre-diabetes   . Sleep apnea    MILD-NO CPAP-COULD NOT TOELERATE   Past Surgical History:  Procedure Laterality Date  . COLONOSCOPY  0000000  . hair folicle removed    . INGUINAL HERNIA REPAIR Bilateral 09/29/2015   Procedure: LAPAROSCOPIC BILATERAL INGUINAL HERNIA REPAIR;  Surgeon: Robert Bellow, MD;  Location: [facility] ORS;  Service: General;  Laterality: Bilateral;  . SKIN GRAFT Right 1966   hand   Family History  Problem Relation Age of Onset  . Ovarian cancer Mother   . Heart disease Father   . Lymphoma Maternal Uncle    Social History   Socioeconomic History  . Marital status: Married    Spouse name: Not on file  . Number of children: Not on file  . Years of education: 19  . Highest education level: High school graduate  Occupational History  . Not on file  Social Needs  . Financial resource strain: Not hard at all  . Food insecurity    Worry: Never true    Inability: Never true  . Transportation needs    Medical: No    Non-medical: No  Tobacco Use  . Smoking status: Former Smoker  Packs/day: 0.25    Years: 15.00    Pack years: 3.75    Types: Cigarettes    Quit date: 09/13/1980    Years since quitting: 38.0  . Smokeless tobacco: Never Used  Substance and Sexual Activity  . Alcohol use: Yes    Alcohol/week: 2.0 standard drinks    Types: 2 Cans of beer per week  . Drug use: No  . Sexual activity: Not on file  Lifestyle  . Physical activity    Days per week: 0 days    Minutes per session: 0 min  . Stress: Not at all  Relationships  . Social connections    Talks on phone: More than three times a week    Gets together: More than three times a week    Attends religious service: More than 4 times per year     Active member of club or organization: Yes    Attends meetings of clubs or organizations: More than 4 times per year    Relationship status: Married  Other Topics Concern  . Not on file  Social History Narrative  . Not on file    Outpatient Encounter Medications as of 10/09/2018  Medication Sig  . Menthol, Topical Analgesic, (ICY HOT EX) Apply 1 application topically daily as needed. For pain  . metoprolol succinate (TOPROL-XL) 25 MG 24 hr tablet TAKE ONE TABLET DAILY AS NEEDED.  . naproxen sodium (ALEVE) 220 MG tablet Take 220 mg by mouth as needed.  Marland Kitchen omeprazole (PRILOSEC) 20 MG capsule Take 1 capsule (20 mg total) by mouth daily.  Marland Kitchen Zoster Vaccine Adjuvanted [facility]) injection Inject 0.5 mLs into the muscle once for 1 dose.  Derrill Memo ON 04/04/2019] Zoster Vaccine Adjuvanted [facility]) injection Inject 0.5 mLs into the muscle once for 1 dose.  . furosemide (LASIX) 20 MG tablet Take 1 tablet (20 mg total) by mouth daily.   No facility-administered encounter medications on file as of 10/09/2018.     Activities of Daily Living In your present state of health, do you have any difficulty performing the following activities: 10/09/2018 10/09/2018  Hearing? N N  Comment no hearing aids -  Vision? N N  Comment eyeglasses -  Difficulty concentrating or making decisions? N N  Walking or climbing stairs? N N  Dressing or bathing? N N  Doing errands, shopping? N N  Preparing Food and eating ? N -  Using the Toilet? N -  In the past six months, have you accidently leaked urine? N -  Do you have problems with loss of bowel control? N -  Managing your Medications? N -  Managing your Finances? N -  Housekeeping or managing your Housekeeping? N -  Some recent data might be hidden    Patient Care Team: Guadalupe Maple, MD as PCP - General (Family Medicine) Kem Parkinson, MD (Ophthalmology) Sharene Butters, MD (General Surgery) Yolonda Kida, MD as Consulting Physician  (Cardiology)   Assessment:   This is a routine wellness examination for Arlis.  Exercise Activities and Dietary recommendations Current Exercise Habits: The patient does not participate in regular exercise at present;Home exercise routine, Type of exercise: walking, Time (Minutes): 15, Frequency (Times/Week): 7, Weekly Exercise (Minutes/Week): 105, Intensity: Mild, Exercise limited by: None identified  Goals    . DIET - INCREASE WATER INTAKE     Recommend drinking at least 6-8 glasses of water a day     . Increase water intake     Recommend drinking  at least 4-5 glasses of water a day       Fall Risk: Fall Risk  10/09/2018 08/20/2017 12/12/2016 09/13/2016 08/17/2016  Falls in the past year? 0 Yes No Yes Yes  Number falls in past yr: 0 1 - 1 1  Injury with Fall? 0 No - No No  Follow up Falls evaluation completed Falls evaluation completed - - -    FALL RISK PREVENTION PERTAINING TO THE HOME:  Any stairs in or around the home? Yes  If so, are there any without handrails? No   Home free of loose throw rugs in walkways, pet beds, electrical cords, etc? Yes  Adequate lighting in your home to reduce risk of falls? Yes   ASSISTIVE DEVICES UTILIZED TO PREVENT FALLS:  Life alert? No  Use of a cane, walker or w/c? No  Grab bars in the bathroom? No  Shower chair or bench in shower? No  Elevated toilet seat or a handicapped toilet? No   TIMED UP AND GO:  Unable to perform   Depression Screen PHQ 2/9 Scores 10/09/2018 08/20/2017 12/12/2016 09/13/2016  PHQ - 2 Score 0 0 0 0  PHQ- 9 Score 2 - - -    Cognitive Function     6CIT Screen 10/09/2018 08/20/2017 08/17/2016  What Year? 0 points 0 points 0 points  What month? 0 points 0 points 0 points  What time? 0 points 0 points 0 points  Count back from 20 0 points 0 points 0 points  Months in reverse 0 points 0 points 0 points  Repeat phrase 0 points 0 points 0 points  Total Score 0 0 0    Immunization History  Administered Date(s)  Administered  . Fluad Quad(high Dose 65+) 10/09/2018  . Influenza,inj,Quad PF,6+ Mos 11/21/2016  . Influenza,inj,quad, With Preservative 12/01/2016  . Influenza-Unspecified 11/12/2013, 12/29/2014, 11/01/2015, 11/14/2017  . Pneumococcal Conjugate-13 09/13/2015  . Pneumococcal Polysaccharide-23 03/25/2009, 08/20/2017  . Pneumococcal-Unspecified 03/25/2009  . Tdap 05/08/2013  . Zoster 09/25/2011    Qualifies for Shingles Vaccine? Yes  Zostavax completed 09/25/2011. Due for Shingrix. Education has been provided regarding the importance of this vaccine. Pt has been advised to call insurance company to determine out of pocket expense. Advised may also receive vaccine at local pharmacy or Health Dept. Verbalized acceptance and understanding.  Tdap: up to date    Flu Vaccine: Done today   Pneumococcal Vaccine: completed series   Screening Tests Health Maintenance  Topic Date Due  . HEMOGLOBIN A1C  03/15/2016  . OPHTHALMOLOGY EXAM  07/17/2018  . FOOT EXAM  10/09/2019  . URINE MICROALBUMIN  10/09/2019  . COLONOSCOPY  10/25/2020  . TETANUS/TDAP  05/09/2023  . INFLUENZA VACCINE  Completed  . Hepatitis C Screening  Completed  . PNA vac Low Risk Adult  Completed   Cancer Screenings:  Colorectal Screening: Completed 10/26/2010.  Lung Cancer Screening: (Low Dose CT Chest recommended if Age 27-80 years, 30 pack-year currently smoking OR have quit w/in 15years.) does not qualify.    Additional Screening:  Hepatitis C Screening: does not qualify;   Vision Screening: Recommended annual ophthalmology exams for early detection of glaucoma and other disorders of the eye. Is the patient up to date with their annual eye exam?  yes Who is the provider or what is the name of the office in which the pt attends annual eye exams? [facility]   Dental Screening: Recommended annual dental exams for proper oral hygiene  Community Resource Referral:  CRR required  this visit?  No         Plan:  I have personally reviewed and addressed the Medicare Annual Wellness questionnaire and have noted the following in the patient's chart:  A. Medical and social history B. Use of alcohol, tobacco or illicit drugs  C. Current medications and supplements D. Functional ability and status E.  Nutritional status F.  Physical activity G. Advance directives H. List of other physicians I.  Hospitalizations, surgeries, and ER visits in previous 12 months J.  [place] such as hearing and vision if needed, cognitive and depression L. Referrals and appointments   In addition, I have reviewed and discussed with patient certain preventive protocols, quality metrics, and best practice recommendations. A written personalized care plan for preventive services as well as general preventive health recommendations were provided to patient.   Signed,   Bevelyn Ngo, LPN  [id number] Nurse Health Advisor   Nurse Notes: none

## 2018-10-10 LAB — CBC WITH DIFFERENTIAL/PLATELET
Basophils Absolute: 0 10*3/uL (ref 0.0–0.2)
Basos: 1 %
EOS (ABSOLUTE): 0.2 10*3/uL (ref 0.0–0.4)
Eos: 5 %
Hematocrit: 44.2 % (ref 37.5–51.0)
Hemoglobin: 15 g/dL (ref 13.0–17.7)
Immature Grans (Abs): 0 10*3/uL (ref 0.0–0.1)
Immature Granulocytes: 0 %
Lymphocytes Absolute: 1.4 10*3/uL (ref 0.7–3.1)
Lymphs: 34 %
MCH: 31.4 pg (ref 26.6–33.0)
MCHC: 33.9 g/dL (ref 31.5–35.7)
MCV: 93 fL (ref 79–97)
Monocytes Absolute: 0.4 10*3/uL (ref 0.1–0.9)
Monocytes: 10 %
Neutrophils Absolute: 2 10*3/uL (ref 1.4–7.0)
Neutrophils: 50 %
Platelets: 198 10*3/uL (ref 150–450)
RBC: 4.78 x10E6/uL (ref 4.14–5.80)
RDW: 12.7 % (ref 11.6–15.4)
WBC: 4 10*3/uL (ref 3.4–10.8)

## 2018-10-10 LAB — COMPREHENSIVE METABOLIC PANEL
ALT: 12 IU/L (ref 0–44)
AST: 17 IU/L (ref 0–40)
Albumin/Globulin Ratio: 1.8 (ref 1.2–2.2)
Albumin: 4.5 g/dL (ref 3.7–4.7)
Alkaline Phosphatase: 57 IU/L (ref 39–117)
BUN/Creatinine Ratio: 17 (ref 10–24)
BUN: 15 mg/dL (ref 8–27)
Bilirubin Total: 0.6 mg/dL (ref 0.0–1.2)
CO2: 25 mmol/L (ref 20–29)
Calcium: 9.5 mg/dL (ref 8.6–10.2)
Chloride: 101 mmol/L (ref 96–106)
Creatinine, Ser: 0.87 mg/dL (ref 0.76–1.27)
GFR calc Af Amer: 100 mL/min/{1.73_m2} (ref >59)
GFR calc non Af Amer: 87 mL/min/{1.73_m2} (ref >59)
Globulin, Total: 2.5 g/dL (ref 1.5–4.5)
Glucose: 117 mg/dL — ABNORMAL HIGH (ref 65–99)
Potassium: 4.5 mmol/L (ref 3.5–5.2)
Sodium: 141 mmol/L (ref 134–144)
Total Protein: 7 g/dL (ref 6.0–8.5)

## 2018-10-10 LAB — PSA: Prostate Specific Ag, Serum: 2.7 ng/mL (ref 0.0–4.0)

## 2018-10-10 LAB — TSH: TSH: 1.73 u[IU]/mL (ref 0.450–4.500)

## 2018-10-10 LAB — HEMOGLOBIN A1C
Est. average glucose Bld gHb Est-mCnc: 128 mg/dL
Hgb A1c MFr Bld: 6.1 % — ABNORMAL HIGH (ref 4.8–5.6)

## 2018-10-10 LAB — LIPID PANEL W/O CHOL/HDL RATIO
Cholesterol, Total: 199 mg/dL (ref 100–199)
HDL: 62 mg/dL (ref >39)
LDL Calculated: 120 mg/dL — ABNORMAL HIGH (ref 0–99)
Triglycerides: 87 mg/dL (ref 0–149)
VLDL Cholesterol Cal: 17 mg/dL (ref 5–40)

## 2018-10-11 ENCOUNTER — Other Ambulatory Visit: Payer: Self-pay | Admitting: Nurse Practitioner

## 2018-10-11 MED ORDER — ATORVASTATIN CALCIUM 10 MG PO TABS: 10.0000 mg | ORAL_TABLET | Freq: Every day | ORAL | 1 refills | Status: DC

## 2018-10-11 NOTE — Progress Notes (Signed)
Lipitor script sent in for patient.

## 2018-11-10 ENCOUNTER — Encounter: Payer: Self-pay | Admitting: Nurse Practitioner

## 2018-11-10 DIAGNOSIS — E785 Hyperlipidemia, unspecified: Secondary | ICD-10-CM | POA: Insufficient documentation

## 2018-11-13 ENCOUNTER — Other Ambulatory Visit: Payer: Self-pay | Admitting: Family Medicine

## 2018-11-13 ENCOUNTER — Ambulatory Visit (INDEPENDENT_AMBULATORY_CARE_PROVIDER_SITE_OTHER): Payer: Medicare Other | Admitting: Nurse Practitioner

## 2018-11-13 ENCOUNTER — Encounter: Payer: Self-pay | Admitting: Nurse Practitioner

## 2018-11-13 ENCOUNTER — Other Ambulatory Visit: Payer: Self-pay

## 2018-11-13 VITALS — BP 123/68 | HR 74 | Temp 98.0°F | Ht 74.0 in | Wt 277.0 lb

## 2018-11-13 DIAGNOSIS — E782 Mixed hyperlipidemia: Secondary | ICD-10-CM | POA: Diagnosis not present

## 2018-11-13 LAB — LIPID PANEL PICCOLO, WAIVED
Chol/HDL Ratio Piccolo,Waive: 2.7 mg/dL
Cholesterol Piccolo, Waived: 158 mg/dL (ref <200)
HDL Chol Piccolo, Waived: 59 mg/dL (ref >59)
LDL Chol Calc Piccolo Waived: 78 mg/dL (ref <100)
Triglycerides Piccolo,Waived: 108 mg/dL (ref <150)
VLDL Chol Calc Piccolo,Waive: 22 mg/dL (ref <30)

## 2018-11-13 MED ORDER — ATORVASTATIN CALCIUM 10 MG PO TABS: 10.0000 mg | ORAL_TABLET | Freq: Every day | ORAL | 1 refills | Status: DC

## 2018-11-13 NOTE — Progress Notes (Signed)
BP 123/68   Pulse 74   Temp 98 F (36.7 C) (Oral)   Ht 6\' 2"  (1.88 m)   Wt 277 lb (125.6 kg)   SpO2 97%   BMI 35.56 kg/m    Subjective:    [name], male    DOB: May 18, 1945, 73 y.o.   MRN: [id number]  HPI: Derek Wright is a 73 y.o. male  Chief Complaint  Patient presents with  . Hyperlipidemia   HYPERLIPIDEMIA Was started on Atorvastatin 10 MG daily four weeks ago.  Denies ADR. Hyperlipidemia status: good compliance Satisfied with current treatment?  yes Side effects:  no Medication compliance: good compliance Past cholesterol meds: Lipitor Supplements: none Aspirin:  no The 10-year ASCVD risk score Mikey Bussing DC Jr., et al., 2013) is: 30.2%   Values used to calculate the score:     Age: 24 years     Sex: Male     Is Non-Hispanic African American: Yes     Diabetic: Yes     Tobacco smoker: No     Systolic Blood Pressure: [id number]     Is BP treated: Yes     HDL Cholesterol: 62 mg/dL     Total Cholesterol: 199 mg/dL Chest pain:  no Coronary artery disease:  no Family history CAD:  no Family history early CAD:  no  Relevant past medical, surgical, family and social history reviewed and updated as indicated. Interim medical history since our last visit reviewed. Allergies and medications reviewed and updated.  Review of Systems  Constitutional: Negative for activity change, diaphoresis, fatigue and fever.  Respiratory: Negative for cough, chest tightness, shortness of breath and wheezing.   Cardiovascular: Negative for chest pain, palpitations and leg swelling.  Gastrointestinal: Negative for abdominal distention, abdominal pain, constipation, diarrhea, nausea and vomiting.  Neurological: Negative for dizziness, syncope, weakness, light-headedness, numbness and headaches.  Psychiatric/Behavioral: Negative.     Per HPI unless specifically indicated above     Objective:    BP 123/68   Pulse 74   Temp 98 F (36.7 C) (Oral)   Ht 6\' 2"  (1.88 m)    Wt 277 lb (125.6 kg)   SpO2 97%   BMI 35.56 kg/m   Wt Readings from Last 3 Encounters:  11/13/18 277 lb (125.6 kg)  10/09/18 277 lb (125.6 kg)  10/09/18 277 lb (125.6 kg)    Physical Exam Vitals signs and nursing note reviewed.  Constitutional:      General: He is awake. He is not in acute distress.    Appearance: He is well-developed. He is obese. He is not ill-appearing.  HENT:     Head: Normocephalic and atraumatic.     Right Ear: Hearing normal. No drainage.     Left Ear: Hearing normal. No drainage.  Eyes:     General: Lids are normal.        Right eye: No discharge.        Left eye: No discharge.     Conjunctiva/sclera: Conjunctivae normal.     Pupils: Pupils are equal, round, and reactive to light.  Neck:     Musculoskeletal: Normal range of motion and neck supple.     Vascular: No carotid bruit.  Cardiovascular:     Rate and Rhythm: Normal rate and regular rhythm.     Heart sounds: Normal heart sounds, S1 normal and S2 normal. No murmur. No gallop.   Pulmonary:     Effort: Pulmonary effort is normal. No accessory  muscle usage or respiratory distress.     Breath sounds: Normal breath sounds.  Abdominal:     General: Bowel sounds are normal.     Palpations: Abdomen is soft. There is no hepatomegaly or splenomegaly.  Musculoskeletal: Normal range of motion.     Right lower leg: No edema.     Left lower leg: No edema.  Skin:    General: Skin is warm and dry.  Neurological:     Mental Status: He is alert and oriented to person, place, and time.  Psychiatric:        Mood and Affect: Mood normal.        Behavior: Behavior normal. Behavior is cooperative.        Thought Content: Thought content normal.        Judgment: Judgment normal.     Results for orders placed or performed in visit on 10/09/18  CBC with Differential/Platelet  Result Value Ref Range   WBC 4.0 3.4 - 10.8 x10E3/uL   RBC 4.78 4.14 - 5.80 x10E6/uL   Hemoglobin 15.0 13.0 - 17.7 g/dL    Hematocrit 44.2 37.5 - 51.0 %   MCV 93 79 - 97 fL   MCH 31.4 26.6 - 33.0 pg   MCHC 33.9 31.5 - 35.7 g/dL   RDW 12.7 11.6 - 15.4 %   Platelets 198 150 - 450 x10E3/uL   Neutrophils 50 Not Estab. %   Lymphs 34 Not Estab. %   Monocytes 10 Not Estab. %   Eos 5 Not Estab. %   Basos 1 Not Estab. %   Neutrophils Absolute 2.0 1.4 - 7.0 x10E3/uL   Lymphocytes Absolute 1.4 0.7 - 3.1 x10E3/uL   Monocytes Absolute 0.4 0.1 - 0.9 x10E3/uL   EOS (ABSOLUTE) 0.2 0.0 - 0.4 x10E3/uL   Basophils Absolute 0.0 0.0 - 0.2 x10E3/uL   Immature Granulocytes 0 Not Estab. %   Immature Grans (Abs) 0.0 0.0 - 0.1 x10E3/uL  Comprehensive metabolic panel  Result Value Ref Range   Glucose 117 (H) 65 - 99 mg/dL   BUN 15 8 - 27 mg/dL   Creatinine, Ser 0.87 0.76 - 1.27 mg/dL   GFR calc non Af Amer 87 >59 mL/min/1.73   GFR calc Af Amer 100 >59 mL/min/1.73   BUN/Creatinine Ratio 17 10 - 24   Sodium 141 134 - 144 mmol/L   Potassium 4.5 3.5 - 5.2 mmol/L   Chloride 101 96 - 106 mmol/L   CO2 25 20 - 29 mmol/L   Calcium 9.5 8.6 - 10.2 mg/dL   Total Protein 7.0 6.0 - 8.5 g/dL   Albumin 4.5 3.7 - 4.7 g/dL   Globulin, Total 2.5 1.5 - 4.5 g/dL   Albumin/Globulin Ratio 1.8 1.2 - 2.2   Bilirubin Total 0.6 0.0 - 1.2 mg/dL   Alkaline Phosphatase 57 39 - 117 IU/L   AST 17 0 - 40 IU/L   ALT 12 0 - 44 IU/L  Lipid Panel w/o Chol/HDL Ratio  Result Value Ref Range   Cholesterol, Total 199 100 - 199 mg/dL   Triglycerides 87 0 - 149 mg/dL   HDL 62 >39 mg/dL   VLDL Cholesterol Cal 17 5 - 40 mg/dL   LDL Calculated 120 (H) 0 - 99 mg/dL  PSA  Result Value Ref Range   Prostate Specific Ag, Serum 2.7 0.0 - 4.0 ng/mL  TSH  Result Value Ref Range   TSH 1.730 0.450 - 4.500 uIU/mL  HgB A1c  Result Value Ref Range  Hgb A1c MFr Bld 6.1 (H) 4.8 - 5.6 %   Est. average glucose Bld gHb Est-mCnc 128 mg/dL  Microalbumin, Urine Waived  Result Value Ref Range   Microalb, Ur Waived 10 0 - 19 mg/L   Creatinine, Urine Waived 100 10 - 300  mg/dL   Microalb/Creat Ratio <30 <30 mg/g      Assessment & Plan:   Problem List Items Addressed This Visit      Other   Hyperlipidemia - Primary    Chronic, ongoing with LDL improved at 78 and total cholesterol improved at 158.  Continue current medication regimen and adjust as needed.  Return in 6 months.      Relevant Orders   Lipid Panel Piccolo, Waived       Follow up plan: Return in about 6 months (around 05/13/2019) for HLD.

## 2018-11-13 NOTE — Patient Instructions (Signed)
Fat and Cholesterol Restricted Eating Plan Getting too much fat and cholesterol in your diet may cause health problems. Choosing the right foods helps keep your fat and cholesterol at normal levels. This can keep you from getting certain diseases. Your doctor may recommend an eating plan that includes:  Total fat: ______% or less of total calories a day.  Saturated fat: ______% or less of total calories a day.  Cholesterol: less than _________mg a day.  Fiber: ______g a day. What are tips for following this plan? Meal planning  At meals, divide your plate into four equal parts: ? Fill one-half of your plate with vegetables and green salads. ? Fill one-fourth of your plate with whole grains. ? Fill one-fourth of your plate with low-fat (lean) protein foods.  Eat fish that is high in omega-3 fats at least two times a week. This includes mackerel, tuna, sardines, and salmon.  Eat foods that are high in fiber, such as whole grains, beans, apples, broccoli, carrots, peas, and barley. General tips   Work with your doctor to lose weight if you need to.  Avoid: ? Foods with added sugar. ? Fried foods. ? Foods with partially hydrogenated oils.  Limit alcohol intake to no more than 1 drink a day for nonpregnant women and 2 drinks a day for men. One drink equals 12 oz of beer, 5 oz of wine, or 1 oz of hard liquor. Reading food labels  Check food labels for: ? Trans fats. ? Partially hydrogenated oils. ? Saturated fat (g) in each serving. ? Cholesterol (mg) in each serving. ? Fiber (g) in each serving.  Choose foods with healthy fats, such as: ? Monounsaturated fats. ? Polyunsaturated fats. ? Omega-3 fats.  Choose grain products that have whole grains. Look for the word "whole" as the first word in the ingredient list. Cooking  Cook foods using low-fat methods. These include baking, boiling, grilling, and broiling.  Eat more home-cooked foods. Eat at restaurants and buffets  less often.  Avoid cooking using saturated fats, such as butter, cream, palm oil, palm kernel oil, and coconut oil. Recommended foods  Fruits  All fresh, canned (in natural juice), or frozen fruits. Vegetables  Fresh or frozen vegetables (raw, steamed, roasted, or grilled). Green salads. Grains  Whole grains, such as whole wheat or whole grain breads, crackers, cereals, and pasta. Unsweetened oatmeal, bulgur, barley, quinoa, or brown rice. Corn or whole wheat flour tortillas. Meats and other protein foods  Ground beef (85% or leaner), grass-fed beef, or beef trimmed of fat. Skinless chicken or turkey. Ground chicken or turkey. Pork trimmed of fat. All fish and seafood. Egg whites. Dried beans, peas, or lentils. Unsalted nuts or seeds. Unsalted canned beans. Nut butters without added sugar or oil. Dairy  Low-fat or nonfat dairy products, such as skim or 1% milk, 2% or reduced-fat cheeses, low-fat and fat-free ricotta or cottage cheese, or plain low-fat and nonfat yogurt. Fats and oils  Tub margarine without trans fats. Light or reduced-fat mayonnaise and salad dressings. Avocado. Olive, canola, sesame, or safflower oils. The items listed above may not be a complete list of foods and beverages you can eat. Contact a dietitian for more information. Foods to avoid Fruits  Canned fruit in heavy syrup. Fruit in cream or butter sauce. Fried fruit. Vegetables  Vegetables cooked in cheese, cream, or butter sauce. Fried vegetables. Grains  White bread. White pasta. White rice. Cornbread. Bagels, pastries, and croissants. Crackers and snack foods that contain trans fat   and hydrogenated oils. Meats and other protein foods  Fatty cuts of meat. Ribs, chicken wings, bacon, sausage, bologna, salami, chitterlings, fatback, hot dogs, bratwurst, and packaged lunch meats. Liver and organ meats. Whole eggs and egg yolks. Chicken and turkey with skin. Fried meat. Dairy  Whole or 2% milk, cream,  half-and-half, and cream cheese. Whole milk cheeses. Whole-fat or sweetened yogurt. Full-fat cheeses. Nondairy creamers and whipped toppings. Processed cheese, cheese spreads, and cheese curds. Beverages  Alcohol. Sugar-sweetened drinks such as sodas, lemonade, and fruit drinks. Fats and oils  Butter, stick margarine, lard, shortening, ghee, or bacon fat. Coconut, palm kernel, and palm oils. Sweets and desserts  Corn syrup, sugars, honey, and molasses. Candy. Jam and jelly. Syrup. Sweetened cereals. Cookies, pies, cakes, donuts, muffins, and ice cream. The items listed above may not be a complete list of foods and beverages you should avoid. Contact a dietitian for more information. Summary  Choosing the right foods helps keep your fat and cholesterol at normal levels. This can keep you from getting certain diseases.  At meals, fill one-half of your plate with vegetables and green salads.  Eat high-fiber foods, like whole grains, beans, apples, carrots, peas, and barley.  Limit added sugar, saturated fats, alcohol, and fried foods. This information is not intended to replace advice given to you by your health care provider. Make sure you discuss any questions you have with your health care provider. Document Released: 08/01/2011 Document Revised: 10/03/2017 Document Reviewed: 10/17/2016 Elsevier Patient Education  2020 [employer]  

## 2018-11-13 NOTE — Telephone Encounter (Signed)
Medication Refill - Medication: atorvastatin (LIPITOR) 10 MG tablet    Preferred Pharmacy (with phone number or street name):  [place], [place] [facility] [phone] (Phone) [phone] (Fax)

## 2018-11-13 NOTE — Assessment & Plan Note (Signed)
Chronic, ongoing with LDL improved at 78 and total cholesterol improved at 158.  Continue current medication regimen and adjust as needed.  Return in 6 months.

## 2018-11-16 ENCOUNTER — Other Ambulatory Visit: Payer: Self-pay | Admitting: Family Medicine

## 2018-11-17 ENCOUNTER — Other Ambulatory Visit: Payer: Self-pay | Admitting: Family Medicine

## 2018-12-13 ENCOUNTER — Other Ambulatory Visit: Payer: Self-pay | Admitting: Family Medicine

## 2018-12-13 ENCOUNTER — Other Ambulatory Visit: Payer: Self-pay | Admitting: Nurse Practitioner

## 2019-01-06 ENCOUNTER — Other Ambulatory Visit: Payer: Self-pay

## 2019-01-06 ENCOUNTER — Ambulatory Visit (INDEPENDENT_AMBULATORY_CARE_PROVIDER_SITE_OTHER): Payer: Medicare Other | Admitting: Nurse Practitioner

## 2019-01-06 ENCOUNTER — Encounter: Payer: Self-pay | Admitting: Nurse Practitioner

## 2019-01-06 DIAGNOSIS — L03011 Cellulitis of right finger: Secondary | ICD-10-CM | POA: Diagnosis not present

## 2019-01-06 DIAGNOSIS — L0390 Cellulitis, unspecified: Secondary | ICD-10-CM | POA: Insufficient documentation

## 2019-01-06 MED ORDER — MUPIROCIN 2 % EX OINT: 1.0000 "application " | TOPICAL_OINTMENT | Freq: Two times a day (BID) | CUTANEOUS | 0 refills | Status: DC

## 2019-01-06 MED ORDER — CEPHALEXIN 500 MG PO CAPS: 500.0000 mg | ORAL_CAPSULE | Freq: Three times a day (TID) | ORAL | 0 refills | Status: AC

## 2019-01-06 NOTE — Progress Notes (Signed)
BP 137/72   Pulse 79   Temp 98.1 F (36.7 C) (Oral)   SpO2 98%    Subjective:    [name], male    DOB: Jun 22, 1945, 73 y.o.   MRN: [id number]  HPI: [name] is a 73 y.o. male  Chief Complaint  Patient presents with  . Nail Problem    pt states he has an infection in his R ring finger   SKIN INFECTION Two weeks ago he "snatched a hang nail" out of his ring finger on right hand.  Since this time area has become swollen and painful.  Reports he did made hole and tried soaking in boiled water, but only drew out a little drainage.  Duration: weeks Location: right ring finger History of trauma in area: no Pain: yes Quality: yes Severity: 7/10 Redness: yes Swelling: yes Oozing: no Pus: no Fevers: no Nausea/vomiting: no Status: stable Treatments attempted:warm compresses  Tetanus: UTD  CrCl 89.35 based on recent labs and weight  Relevant past medical, surgical, family and social history reviewed and updated as indicated. Interim medical history since our last visit reviewed. Allergies and medications reviewed and updated.  Review of Systems  Constitutional: Negative for activity change, diaphoresis, fatigue and fever.  Respiratory: Negative for cough, chest tightness, shortness of breath and wheezing.   Cardiovascular: Negative for chest pain, palpitations and leg swelling.  Gastrointestinal: Negative for abdominal distention, abdominal pain, constipation, diarrhea, nausea and vomiting.  Skin: Positive for wound.  Psychiatric/Behavioral: Negative.     Per HPI unless specifically indicated above     Objective:    BP 137/72   Pulse 79   Temp 98.1 F (36.7 C) (Oral)   SpO2 98%   Wt Readings from Last 3 Encounters:  11/13/18 277 lb (125.6 kg)  10/09/18 277 lb (125.6 kg)  10/09/18 277 lb (125.6 kg)    Physical Exam Vitals signs and nursing note reviewed.  Constitutional:      General: He is awake. He is not in acute distress.    Appearance:  He is well-developed and overweight. He is not ill-appearing.  HENT:     Head: Normocephalic and atraumatic.     Right Ear: Hearing normal. No drainage.     Left Ear: Hearing normal. No drainage.  Eyes:     General: Lids are normal.        Right eye: No discharge.        Left eye: No discharge.     Conjunctiva/sclera: Conjunctivae normal.     Pupils: Pupils are equal, round, and reactive to light.  Neck:     Musculoskeletal: Normal range of motion and neck supple.     Vascular: No carotid bruit.  Cardiovascular:     Rate and Rhythm: Normal rate and regular rhythm.     Heart sounds: Normal heart sounds, S1 normal and S2 normal. No murmur. No gallop.   Pulmonary:     Effort: Pulmonary effort is normal. No accessory muscle usage or respiratory distress.     Breath sounds: Normal breath sounds.  Abdominal:     General: Bowel sounds are normal.     Palpations: Abdomen is soft. There is no hepatomegaly or splenomegaly.  Musculoskeletal: Normal range of motion.     Right lower leg: No edema.     Left lower leg: No edema.  Skin:    General: Skin is warm and dry.     Comments: Right ring finger with mild warmth  and erythema to tip, small area of crusting to lateral aspect.  Swelling present.  No drainage, skin intact.  Full ROM of finger present.  Neurological:     Mental Status: He is alert and oriented to person, place, and time.  Psychiatric:        Mood and Affect: Mood normal.        Behavior: Behavior normal. Behavior is cooperative.        Thought Content: Thought content normal.        Judgment: Judgment normal.     Results for orders placed or performed in visit on 11/13/18  Lipid Panel Piccolo, [employer]  Result Value Ref Range   Cholesterol Piccolo, Waived 158 <200 mg/dL   HDL Chol Piccolo, Waived 59 >59 mg/dL   Triglycerides Piccolo,Waived 108 <150 mg/dL   Chol/HDL Ratio Piccolo,Waive 2.7 mg/dL   LDL Chol Calc Piccolo Waived 78 <100 mg/dL   VLDL Chol Calc Piccolo,Waive  22 <30 mg/dL      Assessment & Plan:   Problem List Items Addressed This Visit      Other   Cellulitis    To right ring finger.  Skin intact with no drainage.  Will initiate abx treatment with Keflex and Mupirocin.  Recommend warm soaks four times a day for 10 to 15 minutes. Do not attempt to drain are on own.  Keep area dry and clean, wear gloves when working outside.  Return in one week for follow-up or sooner if worsening.  Consider drainage of area if no improvement.           Follow up plan: Return in about 1 week (around 01/13/2019) for Finger infection.

## 2019-01-06 NOTE — Patient Instructions (Signed)

## 2019-01-06 NOTE — Assessment & Plan Note (Signed)
To right ring finger.  Skin intact with no drainage.  Will initiate abx treatment with Keflex and Mupirocin.  Recommend warm soaks four times a day for 10 to 15 minutes. Do not attempt to drain are on own.  Keep area dry and clean, wear gloves when working outside.  Return in one week for follow-up or sooner if worsening.  Consider drainage of area if no improvement.

## 2019-01-08 ENCOUNTER — Other Ambulatory Visit: Payer: Self-pay

## 2019-01-15 ENCOUNTER — Ambulatory Visit: Payer: Medicare Other | Admitting: Nurse Practitioner

## 2019-01-16 ENCOUNTER — Other Ambulatory Visit: Payer: Self-pay | Admitting: Family Medicine

## 2019-01-16 MED ORDER — ATORVASTATIN CALCIUM 10 MG PO TABS: 10.0000 mg | ORAL_TABLET | Freq: Every day | ORAL | 0 refills | Status: DC

## 2019-01-16 NOTE — Telephone Encounter (Signed)
Requested Prescriptions  Pending Prescriptions Disp Refills  . atorvastatin (LIPITOR) 10 MG tablet 30 tablet 0    Sig: Take 1 tablet (10 mg total) by mouth daily at 6 PM.     Cardiovascular:  Antilipid - Statins Passed - 01/16/2019 12:03 PM      Passed - Total Cholesterol in normal range and within 360 days    Cholesterol Piccolo, Waived  Date Value Ref Range Status  11/13/2018 158 <200 mg/dL Final    Comment:                            Desirable                <200                         Borderline High      200- 239                         High                     >239          Passed - LDL in normal range and within 360 days    LDL Calculated  Date Value Ref Range Status  10/09/2018 120 (H) 0 - 99 mg/dL Final    Comment:    **Effective October 14, 2018, [facility] is implementing an improved** equation to calculate Low Density Lipoprotein Cholesterol (LDL-C) concentrations, to be used in all lipid panels that report calculated LDL-C. This equation was developed through a collaboration with the [employer], Lung and [place] (NIH).[1] The NIH calculation overcomes the limitations of the existing Friedewald LDL-C equation and performs equally well in both fasting and non-fasting individuals. 1. Pauline Good Q, et al. A new equation for calculation of low-density lipoprotein cholesterol in patients with normolipidemia and/or hypertriglyceridemia. JAMA Cardiol. 2020 Feb 26. doi:10.1001/jamacardio.2020.0013          Passed - HDL in normal range and within 360 days    HDL  Date Value Ref Range Status  10/09/2018 62 >39 mg/dL Final         Passed - Triglycerides in normal range and within 360 days    Triglycerides Piccolo,Waived  Date Value Ref Range Status  11/13/2018 108 <150 mg/dL Final    Comment:                            Normal                   <150                         Borderline High     150 - 199                         High                 200 - 499                         Very High                >499  Passed - Patient is not pregnant      Passed - Valid encounter within last 12 months    Recent Outpatient Visits          1 week ago Cellulitis of finger of right hand   [place], Barbaraann Faster, NP   2 months ago Mixed hyperlipidemia   [place], Barbaraann Faster, NP   3 months ago Encounter for annual physical exam   [place], Henrine Screws T, NP   1 year ago Essential hypertension   [place], Jeannette How, MD   2 years ago Exertional dyspnea   [place], MD      Future Appointments            In 3 months Cannady, Barbaraann Faster, NP [facility]

## 2019-01-16 NOTE — Telephone Encounter (Signed)
Pharmacy calling to request a  90 day supply for the atorvastatin (LIPITOR) 10 MG tablet  [place] [place] [facility] [place] [phone] (Phone) [phone] (Fax)    ALL maintenance med needs to be a 90 day form now on.

## 2019-02-27 DIAGNOSIS — E119 Type 2 diabetes mellitus without complications: Secondary | ICD-10-CM | POA: Diagnosis not present

## 2019-02-27 DIAGNOSIS — M8949 Other hypertrophic osteoarthropathy, multiple sites: Secondary | ICD-10-CM | POA: Diagnosis not present

## 2019-02-27 DIAGNOSIS — I10 Essential (primary) hypertension: Secondary | ICD-10-CM | POA: Diagnosis not present

## 2019-02-27 DIAGNOSIS — K219 Gastro-esophageal reflux disease without esophagitis: Secondary | ICD-10-CM | POA: Diagnosis not present

## 2019-05-13 ENCOUNTER — Ambulatory Visit (INDEPENDENT_AMBULATORY_CARE_PROVIDER_SITE_OTHER): Payer: Medicare Other | Admitting: Nurse Practitioner

## 2019-05-13 ENCOUNTER — Encounter: Payer: Self-pay | Admitting: Nurse Practitioner

## 2019-05-13 ENCOUNTER — Other Ambulatory Visit: Payer: Self-pay

## 2019-05-13 VITALS — BP 116/69 | HR 54 | Temp 98.0°F | Wt 272.0 lb

## 2019-05-13 DIAGNOSIS — I10 Essential (primary) hypertension: Secondary | ICD-10-CM

## 2019-05-13 DIAGNOSIS — R7309 Other abnormal glucose: Secondary | ICD-10-CM | POA: Diagnosis not present

## 2019-05-13 DIAGNOSIS — E782 Mixed hyperlipidemia: Secondary | ICD-10-CM

## 2019-05-13 DIAGNOSIS — Z6835 Body mass index (BMI) 35.0-35.9, adult: Secondary | ICD-10-CM

## 2019-05-13 DIAGNOSIS — E6609 Other obesity due to excess calories: Secondary | ICD-10-CM

## 2019-05-13 DIAGNOSIS — E669 Obesity, unspecified: Secondary | ICD-10-CM | POA: Insufficient documentation

## 2019-05-13 LAB — MICROALBUMIN, URINE WAIVED
Creatinine, Urine Waived: 100 mg/dL (ref 10–300)
Microalb, Ur Waived: 10 mg/L (ref 0–19)
Microalb/Creat Ratio: <30 mg/g (ref <30)

## 2019-05-13 NOTE — Patient Instructions (Signed)

## 2019-05-13 NOTE — Assessment & Plan Note (Signed)
Chronic, stable with BP below goal.  Continue current medication regimen + collaboration with cardiology and recommend checking BP daily at home.  Labs today.  Return in 6 months for annual physical.

## 2019-05-13 NOTE — Progress Notes (Signed)
BP 116/69   Pulse (!) 54   Temp 98 F (36.7 C) (Oral)   Wt 272 lb (123.4 kg)   SpO2 100%   BMI 34.92 kg/m    Subjective:    [name], male    DOB: 1945-07-26, 74 y.o.   MRN: [id number]  HPI: Derek Wright is a 74 y.o. male  Chief Complaint  Patient presents with  . Hyperlipidemia   HYPERTENSION / HYPERLIPIDEMIA Continues on Metoprolol XL 25 MG daily as needed and Atorvastatin 10 MG daily. Saw cardiology 02/27/2019 -- started Lasix to take only as needed for edema to BLE, has not had to use. Satisfied with current treatment? no Duration of hypertension: chronic BP monitoring frequency: not checking BP range:  BP medication side effects: no Duration of hyperlipidemia: chronic Cholesterol medication side effects: no Cholesterol supplements: none Medication compliance: good compliance Aspirin: no Recent stressors: no Recurrent headaches: no Visual changes: no Palpitations: no Dyspnea: no Chest pain: no Lower extremity edema: no Dizzy/lightheaded: no  The 10-year ASCVD risk score Mikey Bussing DC Jr., et al., 2013) is: 26.3%   Values used to calculate the score:     Age: 3 years     Sex: Male     Is Non-Hispanic African American: Yes     Diabetic: Yes     Tobacco smoker: No     Systolic Blood Pressure: 99991111 mmHg     Is BP treated: Yes     HDL Cholesterol: 59 mg/dL     Total Cholesterol: 158 mg/dL  PREDIABETES Last A1C 6.1% in August 2020.  Is focused on diet. Polydipsia/polyuria: no Visual disturbance: no Chest pain: no Paresthesias: no  Relevant past medical, surgical, family and social history reviewed and updated as indicated. Interim medical history since our last visit reviewed. Allergies and medications reviewed and updated.  Review of Systems  Constitutional: Negative for activity change, diaphoresis, fatigue and fever.  Respiratory: Negative for cough, chest tightness, shortness of breath and wheezing.   Cardiovascular: Negative for chest  pain, palpitations and leg swelling.  Gastrointestinal: Negative.   Endocrine: Negative for polydipsia, polyphagia and polyuria.  Neurological: Negative.   Psychiatric/Behavioral: Negative.     Per HPI unless specifically indicated above     Objective:    BP 116/69   Pulse (!) 54   Temp 98 F (36.7 C) (Oral)   Wt 272 lb (123.4 kg)   SpO2 100%   BMI 34.92 kg/m   Wt Readings from Last 3 Encounters:  05/13/19 272 lb (123.4 kg)  11/13/18 277 lb (125.6 kg)  10/09/18 277 lb (125.6 kg)    Physical Exam Vitals and nursing note reviewed.  Constitutional:      General: He is awake. He is not in acute distress.    Appearance: He is well-developed. He is obese. He is not ill-appearing.  HENT:     Head: Normocephalic and atraumatic.     Right Ear: Hearing normal. No drainage.     Left Ear: Hearing normal. No drainage.  Eyes:     General: Lids are normal.        Right eye: No discharge.        Left eye: No discharge.     Conjunctiva/sclera: Conjunctivae normal.     Pupils: Pupils are equal, round, and reactive to light.  Neck:     Thyroid: No thyromegaly.     Vascular: No carotid bruit.  Cardiovascular:     Rate and Rhythm: Normal  rate and regular rhythm.     Heart sounds: Normal heart sounds, S1 normal and S2 normal. No murmur. No gallop.   Pulmonary:     Effort: Pulmonary effort is normal. No accessory muscle usage or respiratory distress.     Breath sounds: Normal breath sounds.  Abdominal:     General: Bowel sounds are normal.     Palpations: Abdomen is soft.  Musculoskeletal:        General: Normal range of motion.     Cervical back: Normal range of motion and neck supple.     Right lower leg: No edema.     Left lower leg: No edema.  Skin:    General: Skin is warm and dry.     Capillary Refill: Capillary refill takes less than 2 seconds.  Neurological:     Mental Status: He is alert and oriented to person, place, and time.  Psychiatric:        Attention and  Perception: Attention normal.        Mood and Affect: Mood normal.        Behavior: Behavior normal. Behavior is cooperative.        Thought Content: Thought content normal.    Results for orders placed or performed in visit on 11/13/18  Lipid Panel Piccolo, [employer]  Result Value Ref Range   Cholesterol Piccolo, Waived 158 <200 mg/dL   HDL Chol Piccolo, Waived 59 >59 mg/dL   Triglycerides Piccolo,Waived 108 <150 mg/dL   Chol/HDL Ratio Piccolo,Waive 2.7 mg/dL   LDL Chol Calc Piccolo Waived 78 <100 mg/dL   VLDL Chol Calc Piccolo,Waive 22 <30 mg/dL      Assessment & Plan:   Problem List Items Addressed This Visit      Cardiovascular and Mediastinum   HTN (hypertension) - Primary    Chronic, stable with BP below goal.  Continue current medication regimen + collaboration with cardiology and recommend checking BP daily at home.  Labs today.  Return in 6 months for annual physical.      Relevant Orders   Basic metabolic panel     Other   Elevated hemoglobin A1c    Check A1C today and continue diet focus.  If elevation consider initiation of medication.        Relevant Orders   Microalbumin, Urine Waived   HgB A1c   Hyperlipidemia    Chronic, ongoing with LDL improved levels last visit with increased in medication dose LDL 78 and total cholesterol improved at 158.  Continue current medication regimen and adjust as needed.  Return in 6 months.      Relevant Orders   Lipid Panel w/o Chol/HDL Ratio   Obesity    Recommend continued focus on healthy diet choices and regular physical activity (30 minutes 5 days a week).           Follow up plan: Return in about 6 months (around 11/13/2019) for Annual physical.

## 2019-05-13 NOTE — Assessment & Plan Note (Signed)
Check A1C today and continue diet focus.  If elevation consider initiation of medication.   

## 2019-05-13 NOTE — Assessment & Plan Note (Signed)
Recommend continued focus on healthy diet choices and regular physical activity (30 minutes 5 days a week).  

## 2019-05-13 NOTE — Assessment & Plan Note (Signed)
Chronic, ongoing with LDL improved levels last visit with increased in medication dose LDL 78 and total cholesterol improved at 158.  Continue current medication regimen and adjust as needed.  Return in 6 months.

## 2019-05-14 LAB — BASIC METABOLIC PANEL
BUN/Creatinine Ratio: 15 (ref 10–24)
BUN: 16 mg/dL (ref 8–27)
CO2: 24 mmol/L (ref 20–29)
Calcium: 9.4 mg/dL (ref 8.6–10.2)
Chloride: 105 mmol/L (ref 96–106)
Creatinine, Ser: 1.07 mg/dL (ref 0.76–1.27)
GFR calc Af Amer: 80 mL/min/{1.73_m2} (ref >59)
GFR calc non Af Amer: 69 mL/min/{1.73_m2} (ref >59)
Glucose: 96 mg/dL (ref 65–99)
Potassium: 4.3 mmol/L (ref 3.5–5.2)
Sodium: 143 mmol/L (ref 134–144)

## 2019-05-14 LAB — HEMOGLOBIN A1C
Est. average glucose Bld gHb Est-mCnc: 128 mg/dL
Hgb A1c MFr Bld: 6.1 % — ABNORMAL HIGH (ref 4.8–5.6)

## 2019-05-14 LAB — LIPID PANEL W/O CHOL/HDL RATIO
Cholesterol, Total: 164 mg/dL (ref 100–199)
HDL: 55 mg/dL (ref >39)
LDL Chol Calc (NIH): 93 mg/dL (ref 0–99)
Triglycerides: 87 mg/dL (ref 0–149)
VLDL Cholesterol Cal: 16 mg/dL (ref 5–40)

## 2019-05-14 NOTE — Progress Notes (Signed)
Please let Derek Wright know his labs have returned.  Kidney function and electrolytes remain normal range.  Cholesterol levels are improving, but for stroke prevention we could do better.  LDL is currently 93, this is the bad cholesterol number, would like to see this < 70. For stroke prevention  I would recommend increasing Atorvastatin to 20 MG, if you are agreeable to this I will send in this new prescription and then would like to see you in 6 weeks to ensure you are tolerating increase and check cholesterol again.  If any questions or concerns let me know.  Have a great day!!

## 2019-05-16 ENCOUNTER — Other Ambulatory Visit: Payer: Self-pay | Admitting: Nurse Practitioner

## 2019-05-16 MED ORDER — ATORVASTATIN CALCIUM 20 MG PO TABS: 20.0000 mg | ORAL_TABLET | Freq: Every day | ORAL | 3 refills | Status: DC

## 2019-06-17 ENCOUNTER — Other Ambulatory Visit: Payer: Self-pay | Admitting: Nurse Practitioner

## 2019-06-17 NOTE — Telephone Encounter (Signed)
This encounter was created in error - please disregard.

## 2019-06-17 NOTE — Telephone Encounter (Signed)
Requested Prescriptions  Pending Prescriptions Disp Refills  . omeprazole (PRILOSEC) 20 MG capsule [Pharmacy Med Name: OMEPRAZOLE DR 20 MG CAPSULE] 90 capsule 0    Sig: Take 1 capsule (20 mg total) by mouth daily.     Gastroenterology: Proton Pump Inhibitors Passed - 06/17/2019  9:33 AM      Passed - Valid encounter within last 12 months    Recent Outpatient Visits          1 month ago Essential hypertension   [place], [place] T, NP   5 months ago Cellulitis of finger of right hand   [place], Barbaraann Faster, NP   7 months ago Mixed hyperlipidemia   [place], Barbaraann Faster, NP   8 months ago Encounter for annual physical exam   [place], Henrine Screws T, NP   1 year ago Essential hypertension   [place], Jeannette How, MD      Future Appointments            In 2 weeks Cannady, Barbaraann Faster, NP [facility], [place]   In 5 months [place], Barbaraann Faster, NP [facility]

## 2019-06-19 ENCOUNTER — Other Ambulatory Visit: Payer: Self-pay | Admitting: Nurse Practitioner

## 2019-06-19 NOTE — Telephone Encounter (Signed)
Requested medication (s) are due for refill today -yes  Requested medication (s) are on the active medication list -yes  Future visit scheduled -yes  Last refill: 12/13/19  Notes to clinic: Medication under PCP no longer at practice- need to change PCP  Requested Prescriptions  Pending Prescriptions Disp Refills   metoprolol succinate (TOPROL-XL) 25 MG 24 hr tablet [Pharmacy Med Name: METOPROLOL SUCC ER 25 MG TAB] 30 tablet 0    Sig: TAKE ONE TABLET DAILY AS NEEDED.      Cardiovascular:  Beta Blockers Passed - 06/19/2019 11:21 AM      Passed - Last BP in normal range    BP Readings from Last 1 Encounters:  05/13/19 116/69          Passed - Last Heart Rate in normal range    Pulse Readings from Last 1 Encounters:  05/13/19 (!) 54          Passed - Valid encounter within last 6 months    Recent Outpatient Visits           1 month ago Essential hypertension   [place], [place] T, NP   5 months ago Cellulitis of finger of right hand   [place], [place] T, NP   7 months ago Mixed hyperlipidemia   [place], Barbaraann Faster, NP   8 months ago Encounter for annual physical exam   [place], Henrine Screws T, NP   1 year ago Essential hypertension   [place], MD       Future Appointments             In 1 week Cannady, Barbaraann Faster, NP [facility]   In 4 months [place], [place] T, NP [facility]                Requested Prescriptions  Pending Prescriptions Disp Refills   metoprolol succinate (TOPROL-XL) 25 MG 24 hr tablet [Pharmacy Med Name: METOPROLOL SUCC ER 25 MG TAB] 30 tablet 0    Sig: TAKE ONE TABLET DAILY AS NEEDED.      Cardiovascular:  Beta Blockers Passed - 06/19/2019 11:21 AM      Passed - Last BP in normal range    BP Readings from Last 1 Encounters:  05/13/19 116/69          Passed - Last Heart Rate in  normal range    Pulse Readings from Last 1 Encounters:  05/13/19 (!) 54          Passed - Valid encounter within last 6 months    Recent Outpatient Visits           1 month ago Essential hypertension   [place], [place] T, NP   5 months ago Cellulitis of finger of right hand   [place], Barbaraann Faster, NP   7 months ago Mixed hyperlipidemia   [place], Barbaraann Faster, NP   8 months ago Encounter for annual physical exam   [place], Henrine Screws T, NP   1 year ago Essential hypertension   [place], Jeannette How, MD       Future Appointments             In 1 week Cannady, Barbaraann Faster, NP [facility], [place]   In 4 months [place], Barbaraann Faster, NP [facility]

## 2019-06-19 NOTE — Telephone Encounter (Signed)
LOV: 05/13/2019; NOV: 07/01/2019 with Marnee Guarneri, NP.   Last filled 12/13/2018 for 30 tablets and no refills by [place], MD.

## 2019-06-27 ENCOUNTER — Encounter: Payer: Self-pay | Admitting: Nurse Practitioner

## 2019-06-27 DIAGNOSIS — I700 Atherosclerosis of aorta: Secondary | ICD-10-CM | POA: Insufficient documentation

## 2019-07-01 ENCOUNTER — Ambulatory Visit (INDEPENDENT_AMBULATORY_CARE_PROVIDER_SITE_OTHER): Payer: Medicare Other | Admitting: Nurse Practitioner

## 2019-07-01 ENCOUNTER — Other Ambulatory Visit: Payer: Self-pay

## 2019-07-01 ENCOUNTER — Encounter: Payer: Self-pay | Admitting: Nurse Practitioner

## 2019-07-01 VITALS — BP 125/74 | HR 53 | Temp 97.9°F | Wt 275.0 lb

## 2019-07-01 DIAGNOSIS — E782 Mixed hyperlipidemia: Secondary | ICD-10-CM

## 2019-07-01 DIAGNOSIS — E6609 Other obesity due to excess calories: Secondary | ICD-10-CM

## 2019-07-01 DIAGNOSIS — I700 Atherosclerosis of aorta: Secondary | ICD-10-CM | POA: Diagnosis not present

## 2019-07-01 DIAGNOSIS — Z6835 Body mass index (BMI) 35.0-35.9, adult: Secondary | ICD-10-CM

## 2019-07-01 MED ORDER — ATORVASTATIN CALCIUM 20 MG PO TABS: 20.0000 mg | ORAL_TABLET | Freq: Every day | ORAL | 3 refills | Status: DC

## 2019-07-01 NOTE — Assessment & Plan Note (Signed)
Noted on CT 08/17/17, recommend continued use of statin daily and adding a daily Baby ASA 81 MG for prevention.  Recommend continued cessation of smoking, he quit in 1982.

## 2019-07-01 NOTE — Assessment & Plan Note (Signed)
Chronic, ongoing.  Educated on statin and cholesterol, he is aware of need to pick up script for 20 MG Atorvastatin and will start this.  Recent LDL in 90's, would benefit from increased dose to help with control and goal <70.  Recommend he take a daily ASA for prevention.  Return in 5 months for annual physical.

## 2019-07-01 NOTE — Assessment & Plan Note (Signed)
Recommended eating smaller high protein, low fat meals more frequently and exercising 30 mins a day 5 times a week with a goal of 10-15lb weight loss in the next 3 months. Patient voiced their understanding and motivation to adhere to these recommendations.  

## 2019-07-01 NOTE — Progress Notes (Signed)
BP 125/74   Pulse (!) 53   Temp 97.9 F (36.6 C) (Oral)   Wt 275 lb (124.7 kg)   BMI 35.31 kg/m    Subjective:    [name], male    DOB: [birth year], 74 y.o.   MRN: [id number]  HPI: Derek Wright is a 74 y.o. male  Chief Complaint  Patient presents with  . Hyperlipidemia   HYPERLIPIDEMIA Continues on Atorvastatin 10 MG, was instructed last visit to increase to 20 MG and a script was sent in, but he forgot.   He reports some days he forgets to take it due to schedule with his wife's multiple provider visits. Hyperlipidemia status: fair compliance Satisfied with current treatment?  yes Side effects:  no Medication compliance: good compliance Supplements: none Aspirin:  yes The 10-year ASCVD risk score Derek Wright DC Jr., et al., 2013) is: 30.5%   Values used to calculate the score:     Age: 82 years     Sex: Male     Is Non-Hispanic African American: Yes     Diabetic: Yes     Tobacco smoker: No     Systolic Blood Pressure: 0000000 mmHg     Is BP treated: Yes     HDL Cholesterol: 55 mg/dL     Total Cholesterol: 164 mg/dL Chest pain:  no Coronary artery disease:  no Family history CAD:  no Family history early CAD:  no  Relevant past medical, surgical, family and social history reviewed and updated as indicated. Interim medical history since our last visit reviewed. Allergies and medications reviewed and updated.  Review of Systems  Constitutional: Negative for activity change, diaphoresis, fatigue and fever.  Respiratory: Negative for cough, chest tightness, shortness of breath and wheezing.   Cardiovascular: Negative for chest pain, palpitations and leg swelling.  Gastrointestinal: Negative.   Endocrine: Negative for polydipsia, polyphagia and polyuria.  Neurological: Negative.   Psychiatric/Behavioral: Negative.     Per HPI unless specifically indicated above     Objective:    BP 125/74   Pulse (!) 53   Temp 97.9 F (36.6 C) (Oral)   Wt 275 lb  (124.7 kg)   BMI 35.31 kg/m   Wt Readings from Last 3 Encounters:  07/01/19 275 lb (124.7 kg)  05/13/19 272 lb (123.4 kg)  11/13/18 277 lb (125.6 kg)    Physical Exam Vitals and nursing note reviewed.  Constitutional:      General: He is awake. He is not in acute distress.    Appearance: He is well-developed. He is obese. He is not ill-appearing.  HENT:     Head: Normocephalic and atraumatic.     Right Ear: Hearing normal. No drainage.     Left Ear: Hearing normal. No drainage.  Eyes:     General: Lids are normal.        Right eye: No discharge.        Left eye: No discharge.     Conjunctiva/sclera: Conjunctivae normal.     Pupils: Pupils are equal, round, and reactive to light.  Neck:     Thyroid: No thyromegaly.     Vascular: No carotid bruit.  Cardiovascular:     Rate and Rhythm: Normal rate and regular rhythm.     Heart sounds: Normal heart sounds, S1 normal and S2 normal. No murmur. No gallop.   Pulmonary:     Effort: Pulmonary effort is normal. No accessory muscle usage or respiratory distress.  Breath sounds: Normal breath sounds.  Abdominal:     General: Bowel sounds are normal.     Palpations: Abdomen is soft.  Musculoskeletal:        General: Normal range of motion.     Cervical back: Normal range of motion and neck supple.     Right lower leg: No edema.     Left lower leg: No edema.  Skin:    General: Skin is warm and dry.     Capillary Refill: Capillary refill takes less than 2 seconds.  Neurological:     Mental Status: He is alert and oriented to person, place, and time.  Psychiatric:        Attention and Perception: Attention normal.        Mood and Affect: Mood normal.        Behavior: Behavior normal. Behavior is cooperative.        Thought Content: Thought content normal.     Results for orders placed or performed in visit on 05/13/19  Lipid Panel w/o Chol/HDL Ratio  Result Value Ref Range   Cholesterol, Total 164 100 - 199 mg/dL    Triglycerides 87 0 - 149 mg/dL   HDL 55 >39 mg/dL   VLDL Cholesterol Cal 16 5 - 40 mg/dL   LDL Chol Calc (NIH) 93 0 - 99 mg/dL  Basic metabolic panel  Result Value Ref Range   Glucose 96 65 - 99 mg/dL   BUN 16 8 - 27 mg/dL   Creatinine, Ser 1.07 0.76 - 1.27 mg/dL   GFR calc non Af Amer 69 >59 mL/min/1.73   GFR calc Af Amer 80 >59 mL/min/1.73   BUN/Creatinine Ratio 15 10 - 24   Sodium 143 134 - 144 mmol/L   Potassium 4.3 3.5 - 5.2 mmol/L   Chloride 105 96 - 106 mmol/L   CO2 24 20 - 29 mmol/L   Calcium 9.4 8.6 - 10.2 mg/dL  Microalbumin, Urine Waived  Result Value Ref Range   Microalb, Ur Waived 10 0 - 19 mg/L   Creatinine, Urine Waived 100 10 - 300 mg/dL   Microalb/Creat Ratio <30 <30 mg/g  HgB A1c  Result Value Ref Range   Hgb A1c MFr Bld 6.1 (H) 4.8 - 5.6 %   Est. average glucose Bld gHb Est-mCnc 128 mg/dL      Assessment & Plan:   Problem List Items Addressed This Visit      Cardiovascular and Mediastinum   Atherosclerosis of aorta ([place]) - Primary    Noted on CT 08/17/17, recommend continued use of statin daily and adding a daily Baby ASA 81 MG for prevention.  Recommend continued cessation of smoking, he quit in 1982.      Relevant Medications   atorvastatin (LIPITOR) 20 MG tablet     Other   Hyperlipidemia    Chronic, ongoing.  Educated on statin and cholesterol, he is aware of need to pick up script for 20 MG Atorvastatin and will start this.  Recent LDL in 90's, would benefit from increased dose to help with control and goal <70.  Recommend he take a daily ASA for prevention.  Return in 5 months for annual physical.      Relevant Medications   atorvastatin (LIPITOR) 20 MG tablet   Obesity    Recommended eating smaller high protein, low fat meals more frequently and exercising 30 mins a day 5 times a week with a goal of 10-15lb weight loss in the next 3 months.  Patient voiced their understanding and motivation to adhere to these recommendations.            Follow up plan: Return in about 4 months (around 11/01/2019) for Annual physical.

## 2019-07-01 NOTE — Patient Instructions (Signed)
Preventing High Cholesterol Cholesterol is a white, waxy substance similar to fat that the human body needs to help build cells. The liver makes all the cholesterol that a person's body needs. Having high cholesterol (hypercholesterolemia) increases a person's risk for heart disease and stroke. Extra (excess) cholesterol comes from the food the person eats. High cholesterol can often be prevented with diet and lifestyle changes. If you already have high cholesterol, you can control it with diet and lifestyle changes and with medicine. How can high cholesterol affect me? If you have high cholesterol, deposits (plaques) may build up on the walls of your arteries. The arteries are the blood vessels that carry blood away from your heart. Plaques make the arteries narrower and stiffer. This can limit or block blood flow and cause blood clots to form. Blood clots:  Are tiny balls of cells that form in your blood.  Can move to the heart or brain, causing a heart attack or stroke. Plaques in arteries greatly increase your risk for heart attack and stroke.Making diet and lifestyle changes can reduce your risk for these conditions that may threaten your life. What can increase my risk? This condition is more likely to develop in people who:  Eat foods that are high in saturated fat or cholesterol. Saturated fat is mostly found in: ? Foods that contain animal fat, such as red meat and some dairy products. ? Certain fatty foods made from plants, such as tropical oils.  Are overweight.  Are not getting enough exercise.  Have a family history of high cholesterol. What actions can I take to prevent this? Nutrition   Eat less saturated fat.  Avoid trans fats (partially hydrogenated oils). These are often found in margarine and in some baked goods, fried foods, and snacks bought in packages.  Avoid precooked or cured meat, such as sausages or meat loaves.  Avoid foods and drinks that have added  sugars.  Eat more fruits, vegetables, and whole grains.  Choose healthy sources of protein, such as fish, poultry, lean cuts of red meat, beans, peas, lentils, and nuts.  Choose healthy sources of fat, such as: ? Nuts. ? Vegetable oils, especially olive oil. ? Fish that have healthy fats (omega-3 fatty acids), such as mackerel or salmon. The items listed above may not be a complete list of recommended foods and beverages. Contact a dietitian for more information. Lifestyle  Lose weight if you are overweight. Losing 5-10 lb (2.3-4.5 kg) can help prevent or control high cholesterol. It can also lower your risk for diabetes and high blood pressure. Ask your health care provider to help you with a diet and exercise plan to lose weight safely.  Do not use any products that contain nicotine or tobacco, such as cigarettes, e-cigarettes, and chewing tobacco. If you need help quitting, ask your health care provider.  Limit your alcohol intake. ? Do not drink alcohol if:  Your health care provider tells you not to drink.  You are pregnant, may be pregnant, or are planning to become pregnant. ? If you drink alcohol:  Limit how much you use to:  0-1 drink a day for women.  0-2 drinks a day for men.  Be aware of how much alcohol is in your drink. In the [place] one drink equals one 12 oz bottle of beer (355 mL), one 5 oz glass of wine (148 mL), or one 1 oz glass of hard liquor (44 mL). Activity   Get enough exercise. Each week, do at   least 150 minutes of exercise that takes a medium level of effort (moderate-intensity exercise). ? This is exercise that:  Makes your heart beat faster and makes you breathe harder than usual.  Allows you to still be able to talk. ? You could exercise in short sessions several times a day or longer sessions a few times a week. For example, on 5 days each week, you could walk fast or ride your bike 3 times a day for 10 minutes each time.  Do exercises as told  by your health care provider. Medicines  In addition to diet and lifestyle changes, your health care provider may recommend medicines to help lower cholesterol. This may be a medicine to lower the amount of cholesterol your liver makes. You may need medicine if: ? Diet and lifestyle changes do not lower your cholesterol enough. ? You have high cholesterol and other risk factors for heart disease or stroke.  Take over-the-counter and prescription medicines only as told by your health care provider. General information  Manage your risk factors for high cholesterol. Talk with your health care provider about all your risk factors and how to lower your risk.  Manage other conditions that you have, such as diabetes or high blood pressure (hypertension).  Have blood tests to check your cholesterol levels at regular points in time as told by your health care provider.  Keep all follow-up visits as told by your health care provider. This is important. Where to find more information  American Heart Association: www.heart.org  National Heart, Lung, and Blood Institute: www.nhlbi.nih.gov Summary  High cholesterol increases your risk for heart disease and stroke. By keeping your cholesterol level low, you can reduce your risk for these conditions.  High cholesterol can often be prevented with diet and lifestyle changes.  Work with your health care provider to manage your risk factors, and have your blood tested regularly. This information is not intended to replace advice given to you by your health care provider. Make sure you discuss any questions you have with your health care provider. Document Revised: 05/24/2018 Document Reviewed: 10/09/2015 Elsevier Patient Education  2020 Elsevier Inc.  

## 2019-08-15 ENCOUNTER — Other Ambulatory Visit: Payer: Self-pay | Admitting: Nurse Practitioner

## 2019-08-21 DIAGNOSIS — E119 Type 2 diabetes mellitus without complications: Secondary | ICD-10-CM | POA: Diagnosis not present

## 2019-08-21 DIAGNOSIS — K219 Gastro-esophageal reflux disease without esophagitis: Secondary | ICD-10-CM | POA: Diagnosis not present

## 2019-08-21 DIAGNOSIS — I10 Essential (primary) hypertension: Secondary | ICD-10-CM | POA: Diagnosis not present

## 2019-08-21 DIAGNOSIS — M8949 Other hypertrophic osteoarthropathy, multiple sites: Secondary | ICD-10-CM | POA: Diagnosis not present

## 2019-09-02 ENCOUNTER — Other Ambulatory Visit: Payer: Self-pay

## 2019-09-02 ENCOUNTER — Encounter: Payer: Self-pay | Admitting: Nurse Practitioner

## 2019-09-02 ENCOUNTER — Ambulatory Visit (INDEPENDENT_AMBULATORY_CARE_PROVIDER_SITE_OTHER): Payer: Medicare Other | Admitting: Nurse Practitioner

## 2019-09-02 VITALS — BP 106/66 | HR 78 | Temp 98.5°F

## 2019-09-02 DIAGNOSIS — M25511 Pain in right shoulder: Secondary | ICD-10-CM | POA: Diagnosis not present

## 2019-09-02 DIAGNOSIS — L729 Follicular cyst of the skin and subcutaneous tissue, unspecified: Secondary | ICD-10-CM | POA: Diagnosis not present

## 2019-09-02 MED ORDER — DOXYCYCLINE HYCLATE 100 MG PO TABS: 100.0000 mg | ORAL_TABLET | Freq: Two times a day (BID) | ORAL | 0 refills | Status: DC

## 2019-09-02 NOTE — Progress Notes (Signed)
BP 106/66   Pulse 78   Temp 98.5 F (36.9 C) (Oral)   SpO2 98%    Subjective:    [name], male    DOB: February 01, 1946, 74 y.o.   MRN: [id number]  HPI: Derek Wright is a 74 y.o. male  Chief Complaint  Patient presents with  . Sore    outside of right calf. Patient states the thursday before this past, he thought it was a blackhead so he squeezed it and blood shot out in stream. States it was itching and burning.   . Shoulder Pain    R shoulder and arm, patient states this has been going on for a while, describes the pain as achy    SKIN INFECTION Reports having an area to outer right calf -- reports area had been there awhile, but when it started burning and itching recently he "popped" it on Thursday and blood came shooting out.  Paramedics came to home and they wrapped it up.   Duration: months Location: outer right calf History of trauma in area: no Pain: a little sore after popping it Quality: sore, no pain Severity: very mild Redness: no Swelling: yes Oozing: no Pus: no Fevers: no Nausea/vomiting: no Status: stable Treatments attempted:none  Tetanus: UTD   SHOULDER PAIN Reports 1 1/2 months of right shoulder pain that radiates down his arm.  Is taking Aleeve and Tylenol + Voltaren gel -- these help discomfort.  Did have x-rays in 2019 with no arthritis noted. Duration: months Involved shoulder: right Mechanism of injury: unknown Location: diffuse Onset:gradual Severity: 8/10 at worst Quality:  Dull and aching Frequency: intermittent -- notices it once daily Radiation: sometimes down arm Aggravating factors: lifting and movement  Alleviating factors: Aleeve and Tylenol + Voltaren gel Status: stable Treatments attempted: Aleeve and Tylenol + Voltaren gel Relief with NSAIDs?:  moderate Weakness: no Numbness: no Decreased grip strength: no Redness: no Swelling: no Bruising: no Fevers: no  Relevant past medical, surgical, family and social  history reviewed and updated as indicated. Interim medical history since our last visit reviewed. Allergies and medications reviewed and updated.  Review of Systems  Constitutional: Negative for activity change, diaphoresis, fatigue and fever.  Respiratory: Negative for cough, chest tightness, shortness of breath and wheezing.   Cardiovascular: Negative for chest pain, palpitations and leg swelling.  Gastrointestinal: Negative.   Musculoskeletal: Positive for arthralgias.  Skin: Positive for wound.  Neurological: Negative.   Psychiatric/Behavioral: Negative.     Per HPI unless specifically indicated above     Objective:    BP 106/66   Pulse 78   Temp 98.5 F (36.9 C) (Oral)   SpO2 98%   Wt Readings from Last 3 Encounters:  07/01/19 275 lb (124.7 kg)  05/13/19 272 lb (123.4 kg)  11/13/18 277 lb (125.6 kg)    Physical Exam Vitals and nursing note reviewed.  Constitutional:      General: He is awake. He is not in acute distress.    Appearance: He is well-developed. He is obese. He is not ill-appearing.  HENT:     Head: Normocephalic and atraumatic.     Right Ear: Hearing normal. No drainage.     Left Ear: Hearing normal. No drainage.  Eyes:     General: Lids are normal.        Right eye: No discharge.        Left eye: No discharge.     Conjunctiva/sclera: Conjunctivae normal.  Pupils: Pupils are equal, round, and reactive to light.  Neck:     Thyroid: No thyromegaly.     Vascular: No carotid bruit.  Cardiovascular:     Rate and Rhythm: Normal rate and regular rhythm.     Heart sounds: Normal heart sounds, S1 normal and S2 normal. No murmur heard.  No gallop.   Pulmonary:     Effort: Pulmonary effort is normal. No accessory muscle usage or respiratory distress.     Breath sounds: Normal breath sounds.  Abdominal:     General: Bowel sounds are normal.     Palpations: Abdomen is soft.  Musculoskeletal:        General: Normal range of motion.     Right shoulder:  Crepitus present. No swelling, effusion, tenderness or bony tenderness. Normal range of motion. Normal strength. Normal pulse.     Left shoulder: Normal.     Cervical back: Normal range of motion and neck supple.     Right lower leg: 1+ Edema present.     Left lower leg: 1+ Edema present.     Comments: Moderate crepitus noted to right shoulder.  Skin:    General: Skin is warm and dry.     Capillary Refill: Capillary refill takes less than 2 seconds.       Neurological:     Mental Status: He is alert and oriented to person, place, and time.  Psychiatric:        Attention and Perception: Attention normal.        Mood and Affect: Mood normal.        Behavior: Behavior normal. Behavior is cooperative.        Thought Content: Thought content normal.     Results for orders placed or performed in visit on 05/13/19  Lipid Panel w/o Chol/HDL Ratio  Result Value Ref Range   Cholesterol, Total 164 100 - 199 mg/dL   Triglycerides 87 0 - 149 mg/dL   HDL 55 >39 mg/dL   VLDL Cholesterol Cal 16 5 - 40 mg/dL   LDL Chol Calc (NIH) 93 0 - 99 mg/dL  Basic metabolic panel  Result Value Ref Range   Glucose 96 65 - 99 mg/dL   BUN 16 8 - 27 mg/dL   Creatinine, Ser 1.07 0.76 - 1.27 mg/dL   GFR calc non Af Amer 69 >59 mL/min/1.73   GFR calc Af Amer 80 >59 mL/min/1.73   BUN/Creatinine Ratio 15 10 - 24   Sodium 143 134 - 144 mmol/L   Potassium 4.3 3.5 - 5.2 mmol/L   Chloride 105 96 - 106 mmol/L   CO2 24 20 - 29 mmol/L   Calcium 9.4 8.6 - 10.2 mg/dL  Microalbumin, Urine Waived  Result Value Ref Range   Microalb, Ur Waived 10 0 - 19 mg/L   Creatinine, Urine Waived 100 10 - 300 mg/dL   Microalb/Creat Ratio <30 <30 mg/g  HgB A1c  Result Value Ref Range   Hgb A1c MFr Bld 6.1 (H) 4.8 - 5.6 %   Est. average glucose Bld gHb Est-mCnc 128 mg/dL      Assessment & Plan:   Problem List Items Addressed This Visit      Musculoskeletal and Integument   Skin cyst - Primary    To right lower, outer leg  beginning to heal.  Has been present for some time and recently drained on own.  Recommend continue to monitor area closely.  Place warm compressed 3 times a day for  comfort and take Tylenol as needed.  Script for Doxycyline sent due to mild edema and erythema at site.  May place Neosporin ointment to area at home.  Return to office in 4 weeks or sooner if worsening.        Other   Arthralgia    Right shoulder at this time.  Does not wish to have imaging at this time, as is good with treatment regimen at home.  Have recommended imaging if pain worsens, he will notify provider.  At this time recommend continuing Tylenol and Voltaren gel at home as this regimen is helping with pain.  Suspect arthritis to area based on exam.  Return to office for worsening or ongoing.  Could consider PT referral if worsening.          Follow up plan: Return in about 4 weeks (around 09/30/2019) for T2DM, HTN/HLD.

## 2019-09-02 NOTE — Patient Instructions (Signed)
Shoulder Pain Many things can cause shoulder pain, including:  An injury.  Moving the shoulder in the same way again and again (overuse).  Joint pain (arthritis). Pain can come from:  Swelling and irritation (inflammation) of any part of the shoulder.  An injury to the shoulder joint.  An injury to: ? Tissues that connect muscle to bone (tendons). ? Tissues that connect bones to each other (ligaments). ? Bones. Follow these instructions at home: Watch for changes in your symptoms. Let your doctor know about them. Follow these instructions to help with your pain. If you have a sling:  Wear the sling as told by your doctor. Remove it only as told by your doctor.  Loosen the sling if your fingers: ? Tingle. ? Become numb. ? Turn cold and blue.  Keep the sling clean.  If the sling is not waterproof: ? Do not let it get wet. ? Take the sling off when you shower or bathe. Managing pain, stiffness, and swelling   If told, put ice on the painful area: ? Put ice in a plastic bag. ? Place a towel between your skin and the bag. ? Leave the ice on for 20 minutes, 2-3 times a day. Stop putting ice on if it does not help with the pain.  Squeeze a soft ball or a foam pad as much as possible. This prevents swelling in the shoulder. It also helps to strengthen the arm. General instructions  Take over-the-counter and prescription medicines only as told by your doctor.  Keep all follow-up visits as told by your doctor. This is important. Contact a doctor if:  Your pain gets worse.  Medicine does not help your pain.  You have new pain in your arm, hand, or fingers. Get help right away if:  Your arm, hand, or fingers: ? Tingle. ? Are numb. ? Are swollen. ? Are painful. ? Turn white or blue. Summary  Shoulder pain can be caused by many things. These include injury, moving the shoulder in the same away again and again, and joint pain.  Watch for changes in your symptoms.  Let your doctor know about them.  This condition may be treated with a sling, ice, and pain medicine.  Contact your doctor if the pain gets worse or you have new pain. Get help right away if your arm, hand, or fingers tingle or get numb, swollen, or painful.  Keep all follow-up visits as told by your doctor. This is important. This information is not intended to replace advice given to you by your health care provider. Make sure you discuss any questions you have with your health care provider. Document Revised: 08/14/2017 Document Reviewed: 08/14/2017 Elsevier Patient Education  2020 Elsevier Inc.  

## 2019-09-02 NOTE — Assessment & Plan Note (Signed)
To right lower, outer leg beginning to heal.  Has been present for some time and recently drained on own.  Recommend continue to monitor area closely.  Place warm compressed 3 times a day for comfort and take Tylenol as needed.  Script for Doxycyline sent due to mild edema and erythema at site.  May place Neosporin ointment to area at home.  Return to office in 4 weeks or sooner if worsening.

## 2019-09-02 NOTE — Assessment & Plan Note (Signed)
Right shoulder at this time.  Does not wish to have imaging at this time, as is good with treatment regimen at home.  Have recommended imaging if pain worsens, he will notify provider.  At this time recommend continuing Tylenol and Voltaren gel at home as this regimen is helping with pain.  Suspect arthritis to area based on exam.  Return to office for worsening or ongoing.  Could consider PT referral if worsening.

## 2019-09-13 ENCOUNTER — Other Ambulatory Visit: Payer: Self-pay | Admitting: Nurse Practitioner

## 2019-09-13 NOTE — Telephone Encounter (Signed)
Requested Prescriptions  Pending Prescriptions Disp Refills  . omeprazole (PRILOSEC) 20 MG capsule [Pharmacy Med Name: OMEPRAZOLE DR 20 MG CAPSULE] 90 capsule 0    Sig: Take 1 capsule (20 mg total) by mouth daily.     Gastroenterology: Proton Pump Inhibitors Passed - 09/13/2019 11:04 AM      Passed - Valid encounter within last 12 months    Recent Outpatient Visits          1 week ago Skin cyst   [place], Henrine Screws T, NP   2 months ago Atherosclerosis of aorta [facility])   [place], Barbaraann Faster, NP   4 months ago Essential hypertension   [place], [place] T, NP   8 months ago Cellulitis of finger of right hand   [place], Barbaraann Faster, NP   10 months ago Mixed hyperlipidemia   [place], Barbaraann Faster, NP      Future Appointments            In 2 weeks [place], Barbaraann Faster, NP [facility], [place]   In 1 month [place], Barbaraann Faster, NP [facility]

## 2019-09-18 ENCOUNTER — Other Ambulatory Visit: Payer: Self-pay | Admitting: Nurse Practitioner

## 2019-09-30 ENCOUNTER — Telehealth: Payer: Self-pay | Admitting: Nurse Practitioner

## 2019-09-30 NOTE — Telephone Encounter (Signed)
Copied from [place] [id number]. Topic: General - Other >> Sep 29, 2019  4:43 PM Antonieta Iba C wrote: Reason for CRM: pt says that he spoke with his insurance [employer] and was told that he has to switch to Dr. Wynetta Emery. Pt says that he has a few appointment that's coming up and would like to know what should he do? Provider isn't taking pts  Please assist pt.

## 2019-10-02 ENCOUNTER — Telehealth: Payer: Self-pay | Admitting: Nurse Practitioner

## 2019-10-02 NOTE — Telephone Encounter (Signed)
Copied from [place] [id number]. Topic: Medicare AWV >> Oct 02, 2019  2:00 PM Cher Nakai R wrote: Reason for CRM:  Left message for patient to call back and schedule the Medicare Annual Wellness Visit (AWV) virtually.  Last AWV 10/09/2018  Please schedule at anytime with [facility] Ad[facility].  45 minute appointment  Any questions, please call me at [phone]

## 2019-10-03 ENCOUNTER — Ambulatory Visit: Payer: Medicare Other | Admitting: Nurse Practitioner

## 2019-10-06 ENCOUNTER — Other Ambulatory Visit: Payer: Self-pay | Admitting: Nurse Practitioner

## 2019-11-04 ENCOUNTER — Encounter: Payer: Medicare Other | Admitting: Nurse Practitioner

## 2019-11-14 ENCOUNTER — Other Ambulatory Visit: Payer: Self-pay | Admitting: Nurse Practitioner

## 2019-11-14 ENCOUNTER — Encounter: Payer: Medicare Other | Admitting: Nurse Practitioner

## 2019-11-14 NOTE — Telephone Encounter (Signed)
LMOM for patient to return my call at [phone] regarding insurance questions.

## 2019-11-14 NOTE — Telephone Encounter (Signed)
Requested Prescriptions  Pending Prescriptions Disp Refills   metoprolol succinate (TOPROL-XL) 25 MG 24 hr tablet [Pharmacy Med Name: METOPROLOL SUCC ER 25 MG TAB] 30 tablet 0    Sig: TAKE ONE TABLET DAILY AS NEEDED.     Cardiovascular:  Beta Blockers Passed - 11/14/2019  8:31 AM      Passed - Last BP in normal range    BP Readings from Last 1 Encounters:  09/02/19 106/66         Passed - Last Heart Rate in normal range    Pulse Readings from Last 1 Encounters:  09/02/19 78         Passed - Valid encounter within last 6 months    Recent Outpatient Visits          2 months ago Skin cyst   [facility] [place], Henrine Screws T, NP   4 months ago Atherosclerosis of aorta ([place])   [place], Barbaraann Faster, NP   6 months ago Essential hypertension   [place], [place] T, NP   10 months ago Cellulitis of finger of right hand   [place], Barbaraann Faster, NP   1 year ago Mixed hyperlipidemia   [place], Barbaraann Faster, NP      Future Appointments            In 4 weeks [place], Barbaraann Faster, NP [employer]

## 2019-12-01 ENCOUNTER — Other Ambulatory Visit: Payer: Self-pay | Admitting: Nurse Practitioner

## 2019-12-11 DIAGNOSIS — E119 Type 2 diabetes mellitus without complications: Secondary | ICD-10-CM | POA: Diagnosis not present

## 2019-12-11 LAB — HM DIABETES EYE EXAM

## 2019-12-12 ENCOUNTER — Other Ambulatory Visit: Payer: Self-pay

## 2019-12-12 ENCOUNTER — Encounter: Payer: Self-pay | Admitting: Nurse Practitioner

## 2019-12-12 ENCOUNTER — Ambulatory Visit (INDEPENDENT_AMBULATORY_CARE_PROVIDER_SITE_OTHER): Payer: Medicare Other | Admitting: Nurse Practitioner

## 2019-12-12 VITALS — BP 112/72 | HR 57 | Temp 98.5°F | Resp 16 | Ht 74.5 in | Wt 284.0 lb

## 2019-12-12 DIAGNOSIS — Z0000 Encounter for general adult medical examination without abnormal findings: Secondary | ICD-10-CM | POA: Diagnosis not present

## 2019-12-12 DIAGNOSIS — B351 Tinea unguium: Secondary | ICD-10-CM

## 2019-12-12 DIAGNOSIS — I10 Essential (primary) hypertension: Secondary | ICD-10-CM

## 2019-12-12 DIAGNOSIS — I700 Atherosclerosis of aorta: Secondary | ICD-10-CM

## 2019-12-12 DIAGNOSIS — N400 Enlarged prostate without lower urinary tract symptoms: Secondary | ICD-10-CM

## 2019-12-12 DIAGNOSIS — Z6835 Body mass index (BMI) 35.0-35.9, adult: Secondary | ICD-10-CM

## 2019-12-12 DIAGNOSIS — E782 Mixed hyperlipidemia: Secondary | ICD-10-CM | POA: Diagnosis not present

## 2019-12-12 DIAGNOSIS — E6609 Other obesity due to excess calories: Secondary | ICD-10-CM

## 2019-12-12 DIAGNOSIS — R7309 Other abnormal glucose: Secondary | ICD-10-CM

## 2019-12-12 LAB — MICROALBUMIN, URINE WAIVED
Creatinine, Urine Waived: 100 mg/dL (ref 10–300)
Microalb, Ur Waived: 10 mg/L (ref 0–19)
Microalb/Creat Ratio: <30 mg/g (ref <30)

## 2019-12-12 NOTE — Assessment & Plan Note (Signed)
Recommended eating smaller high protein, low fat meals more frequently and exercising 30 mins a day 5 times a week with a goal of 10-15lb weight loss in the next 3 months. Patient voiced their understanding and motivation to adhere to these recommendations.  

## 2019-12-12 NOTE — Assessment & Plan Note (Signed)
Noted on CT 08/17/17, recommend continued use of statin daily and adding a daily Baby ASA 81 MG for prevention.  Recommend continued cessation of smoking, he quit in 1982.

## 2019-12-12 NOTE — Assessment & Plan Note (Signed)
Check A1C today and continue diet focus.  If elevation consider initiation of medication.

## 2019-12-12 NOTE — Assessment & Plan Note (Signed)
Chronic, stable with BP below goal on recheck today.  Continue current medication regimen + collaboration with cardiology and recommend checking BP daily at home and documenting + focus on DASH diet.  Labs today, CBC, CMP, TSH.  Return in 6 months.

## 2019-12-12 NOTE — Progress Notes (Signed)
BP 112/72 (BP Location: Left Arm)    Pulse (!) 57    Temp 98.5 F (36.9 C) (Oral)    Resp 16    Ht 6' 2.5" (1.892 m)    Wt 284 lb (128.8 kg)    SpO2 100%    BMI 35.98 kg/m    Subjective:    [name], male    DOB: [birth year], 74 y.o.   MRN: [id number]  HPI: DELMOS Wright is a 74 y.o. male presenting on 12/12/2019 for comprehensive medical examination. Current medical complaints include:none  He currently lives with: wife Interim Problems from his last visit: no   HYPERTENSION/HLD Followed by Dr. Clayborn Bigness and last seen 02/27/2019, no changes made on review of note. Continues on Metoprolol XL and Lasix (has edema at baseline BLE) + Atorvastatin for HLD.  Does endorse not always following low sodium diet at home. Hypertension status: stable  Satisfied with current treatment? yes Duration of hypertension: chronic BP monitoring frequency:  not checking BP range:  BP medication side effects:  no Medication compliance: good compliance Aspirin: no Recurrent headaches: no Visual changes: no Palpitations: no Dyspnea: no Chest pain: no Lower extremity edema: yes, at baseline, no worse Dizzy/lightheaded: no  The 10-year ASCVD risk score Mikey Bussing DC Jr., et al., 2013) is: 26.3%   Values used to calculate the score:     Age: 49 years     Sex: Male     Is Non-Hispanic African American: Yes     Diabetic: Yes     Tobacco smoker: No     Systolic Blood Pressure: 854 mmHg     Is BP treated: Yes     HDL Cholesterol: 55 mg/dL     Total Cholesterol: 164 mg/dL   PREDIABETES: Last A1C in March 6.1%.  He is currently diet controlled.  Does not check BS at home. Polydipsia/polyuria: no Visual disturbance: no -- had eye exam yesterday Chest pain: no Paresthesias: no  Functional Status Survey: Is the patient deaf or have difficulty hearing?: No Does the patient have difficulty seeing, even when wearing glasses/contacts?: No Does the patient have difficulty concentrating,  remembering, or making decisions?: No Does the patient have difficulty walking or climbing stairs?: No Does the patient have difficulty dressing or bathing?: No Does the patient have difficulty doing errands alone such as visiting a doctor's office or shopping?: No  FALL RISK: Fall Risk  12/12/2019 10/09/2018 08/20/2017 12/12/2016 09/13/2016  Falls in the past year? 0 0 Yes No Yes  Number falls in past yr: 0 0 1 - 1  Injury with Fall? 0 0 No - No  Risk for fall due to : No Fall Risks - - - -  Follow up Falls evaluation completed Falls evaluation completed Falls evaluation completed - -    Depression Screen Depression screen [facility] 2/9 12/12/2019 10/09/2018 08/20/2017 12/12/2016 09/13/2016  Decreased Interest 0 0 0 0 0  Down, Depressed, Hopeless 0 0 0 0 0  PHQ - 2 Score 0 0 0 0 0  Altered sleeping - 1 - - -  Tired, decreased energy - 1 - - -  Change in appetite - 0 - - -  Feeling bad or failure about yourself  - 0 - - -  Trouble concentrating - 0 - - -  Moving slowly or fidgety/restless - 0 - - -  Suicidal thoughts - 0 - - -  PHQ-9 Score - 2 - - -  Difficult doing work/chores -  Not difficult at all - - -    Advanced Directives <no information>  Past Medical History:  Past Medical History:  Diagnosis Date   Arthritis    GERD (gastroesophageal reflux disease)    RARE-NO MEDS   Pre-diabetes    Sleep apnea    MILD-NO CPAP-COULD NOT TOELERATE    Surgical History:  Past Surgical History:  Procedure Laterality Date   COLONOSCOPY  4888   hair folicle removed     INGUINAL HERNIA REPAIR Bilateral 09/29/2015   Procedure: LAPAROSCOPIC BILATERAL INGUINAL HERNIA REPAIR;  Surgeon: Robert Bellow, MD;  Location: [facility] ORS;  Service: General;  Laterality: Bilateral;   SKIN GRAFT Right 1966   hand    Medications:  Current Outpatient Medications on File Prior to Visit  Medication Sig   atorvastatin (LIPITOR) 20 MG tablet Take 1 tablet (20 mg total) by mouth daily.   metoprolol  succinate (TOPROL-XL) 25 MG 24 hr tablet TAKE ONE TABLET DAILY AS NEEDED.   omeprazole (PRILOSEC) 20 MG capsule Take 1 capsule (20 mg total) by mouth daily.   [DISCONTINUED] metoprolol succinate (TOPROL-XL) 25 MG 24 hr tablet TAKE ONE TABLET DAILY AS NEEDED.   No current facility-administered medications on file prior to visit.    Allergies:  No Known Allergies  Social History:  Social History   Socioeconomic History   Marital status: Married    Spouse name: Not on file   Number of children: Not on file   Years of education: 12   Highest education level: High school graduate  Occupational History   Not on file  Tobacco Use   Smoking status: Former Smoker    Packs/day: 0.25    Years: 15.00    Pack years: 3.75    Types: Cigarettes    Quit date: 09/13/1980    Years since quitting: 39.2   Smokeless tobacco: Never Used  Vaping Use   Vaping Use: Never used  Substance and Sexual Activity   Alcohol use: Yes    Alcohol/week: 2.0 standard drinks    Types: 2 Cans of beer per week   Drug use: No   Sexual activity: Not on file  Other Topics Concern   Not on file  Social History Narrative   Not on file   Social Determinants of Health   Financial Resource Strain:    Difficulty of Paying Living Expenses: Not on file  Food Insecurity:    Worried About [place] in the Last Year: Not on file   Ran Out of Food in the Last Year: Not on file  Transportation Needs:    Lack of Transportation (Medical): Not on file   Lack of Transportation (Non-Medical): Not on file  Physical Activity:    Days of Exercise per Week: Not on file   Minutes of Exercise per Session: Not on file  Stress:    Feeling of Stress : Not on file  Social Connections:    Frequency of Communication with Friends and Family: Not on file   Frequency of Social Gatherings with Friends and Family: Not on file   Attends Religious Services: Not on file   Active Member of Clubs or  Organizations: Not on file   Attends Archivist Meetings: Not on file   Marital Status: Not on file  Intimate Partner Violence:    Fear of Current or Ex-Partner: Not on file   Emotionally Abused: Not on file   Physically Abused: Not on file   Sexually Abused:  Not on file   Social History   Tobacco Use  Smoking Status Former Smoker   Packs/day: 0.25   Years: 15.00   Pack years: 3.75   Types: Cigarettes   Quit date: 09/13/1980   Years since quitting: 39.2  Smokeless Tobacco Never Used   Social History   Substance and Sexual Activity  Alcohol Use Yes   Alcohol/week: 2.0 standard drinks   Types: 2 Cans of beer per week    Family History:  Family History  Problem Relation Age of Onset   Ovarian cancer Mother    Heart disease Father    Lymphoma Maternal Uncle     Past medical history, surgical history, medications, allergies, family history and social history reviewed with patient today and changes made to appropriate areas of the chart.   Review of Systems - negative All other ROS negative except what is listed above and in the HPI.      Objective:    BP 112/72 (BP Location: Left Arm)    Pulse (!) 57    Temp 98.5 F (36.9 C) (Oral)    Resp 16    Ht 6' 2.5" (1.892 m)    Wt 284 lb (128.8 kg)    SpO2 100%    BMI 35.98 kg/m   Wt Readings from Last 3 Encounters:  12/12/19 284 lb (128.8 kg)  07/01/19 275 lb (124.7 kg)  05/13/19 272 lb (123.4 kg)    Physical Exam Vitals and nursing note reviewed.  Constitutional:      General: He is awake. He is not in acute distress.    Appearance: He is well-developed. He is obese. He is not ill-appearing.  HENT:     Head: Normocephalic and atraumatic.     Right Ear: Hearing, tympanic membrane, ear canal and external ear normal. No drainage.     Left Ear: Hearing, tympanic membrane, ear canal and external ear normal. No drainage.     Nose: Nose normal.     Mouth/Throat:     Pharynx: Oropharynx is clear.  Uvula midline.  Eyes:     General: Lids are normal.        Right eye: No discharge.        Left eye: No discharge.     Extraocular Movements: Extraocular movements intact.     Conjunctiva/sclera: Conjunctivae normal.     Pupils: Pupils are equal, round, and reactive to light.     Visual Fields: Right eye visual fields normal and left eye visual fields normal.  Neck:     Thyroid: No thyromegaly.     Vascular: No carotid bruit.  Cardiovascular:     Rate and Rhythm: Normal rate and regular rhythm.     Heart sounds: Normal heart sounds, S1 normal and S2 normal. No murmur heard.  No gallop.   Pulmonary:     Effort: Pulmonary effort is normal. No accessory muscle usage or respiratory distress.     Breath sounds: Normal breath sounds.  Abdominal:     General: Bowel sounds are normal.     Palpations: Abdomen is soft. There is no hepatomegaly or splenomegaly.     Tenderness: There is no abdominal tenderness.     Hernia: There is no hernia in the left inguinal area or right inguinal area.  Genitourinary:    Penis: Normal.      Testes: Normal.  Musculoskeletal:        General: Normal range of motion.     Cervical back: Normal range  of motion and neck supple.     Right lower leg: 2+ Pitting Edema present.     Left lower leg: 2+ Pitting Edema present.  Lymphadenopathy:     Head:     Right side of head: No submental, submandibular, tonsillar, preauricular or posterior auricular adenopathy.     Left side of head: No submental, submandibular, tonsillar, preauricular or posterior auricular adenopathy.     Cervical: No cervical adenopathy.  Skin:    General: Skin is warm and dry.     Capillary Refill: Capillary refill takes less than 2 seconds.     Findings: No rash.  Neurological:     Mental Status: He is alert and oriented to person, place, and time.     Cranial Nerves: Cranial nerves are intact.     Gait: Gait is intact.     Deep Tendon Reflexes:     Reflex Scores:       Brachioradialis reflexes are 1+ on the right side and 1+ on the left side.      Patellar reflexes are 1+ on the right side and 1+ on the left side. Psychiatric:        Attention and Perception: Attention normal.        Mood and Affect: Mood normal.        Speech: Speech normal.        Behavior: Behavior normal. Behavior is cooperative.        Thought Content: Thought content normal.        Cognition and Memory: Cognition normal.        Judgment: Judgment normal.    6CIT Screen 12/12/2019 10/09/2018 08/20/2017 08/17/2016  What Year? 0 points 0 points 0 points 0 points  What month? 0 points 0 points 0 points 0 points  What time? 0 points 0 points 0 points 0 points  Count back from 20 0 points 0 points 0 points 0 points  Months in reverse 0 points 0 points 0 points 0 points  Repeat phrase 0 points 0 points 0 points 0 points  Total Score 0 0 0 0     Diabetic Foot Exam - Simple   Simple Foot Form Visual Inspection See comments: Yes Sensation Testing Intact to touch and monofilament testing bilaterally: Yes Pulse Check Posterior Tibialis and Dorsalis pulse intact bilaterally: Yes Comments 2+ DP bilaterally and 1+ PT.  Intact sensation bilaterally.  Thick, toenails to both feet.  Dry skin.  2+ edema and decreased hair pattern.     Results for orders placed or performed in visit on 05/13/19  Lipid Panel w/o Chol/HDL Ratio  Result Value Ref Range   Cholesterol, Total 164 100 - 199 mg/dL   Triglycerides 87 0 - 149 mg/dL   HDL 55 >39 mg/dL   VLDL Cholesterol Cal 16 5 - 40 mg/dL   LDL Chol Calc (NIH) 93 0 - 99 mg/dL  Basic metabolic panel  Result Value Ref Range   Glucose 96 65 - 99 mg/dL   BUN 16 8 - 27 mg/dL   Creatinine, Ser 1.07 0.76 - 1.27 mg/dL   GFR calc non Af Amer 69 >59 mL/min/1.73   GFR calc Af Amer 80 >59 mL/min/1.73   BUN/Creatinine Ratio 15 10 - 24   Sodium 143 134 - 144 mmol/L   Potassium 4.3 3.5 - 5.2 mmol/L   Chloride 105 96 - 106 mmol/L   CO2 24 20 - 29 mmol/L     Calcium 9.4 8.6 - 10.2 mg/dL  Microalbumin, Urine Waived  Result Value Ref Range   Microalb, Ur Waived 10 0 - 19 mg/L   Creatinine, Urine Waived 100 10 - 300 mg/dL   Microalb/Creat Ratio <30 <30 mg/g  HgB A1c  Result Value Ref Range   Hgb A1c MFr Bld 6.1 (H) 4.8 - 5.6 %   Est. average glucose Bld gHb Est-mCnc 128 mg/dL      Assessment & Plan:   Problem List Items Addressed This Visit      Cardiovascular and Mediastinum   Atherosclerosis of aorta ([place]) - Primary    Noted on CT 08/17/17, recommend continued use of statin daily and adding a daily Baby ASA 81 MG for prevention.  Recommend continued cessation of smoking, he quit in 1982.        Genitourinary   BPH without obstruction/lower urinary tract symptoms    Chronic, stable without medication at this time.  PSA today.      Relevant Orders   PSA     Other   Elevated hemoglobin A1c    Check A1C today and continue diet focus.  If elevation consider initiation of medication.        Relevant Orders   Hemoglobin A1c   Microalbumin, Urine Waived   Ambulatory referral to Podiatry   Hyperlipidemia    Chronic, ongoing.  Continue daily statin and adjust dose as needed. Recommend he take a daily ASA 81 MG for prevention.  Return in 6 months.  Lipid panel today.      Relevant Orders   Lipid Panel With LDL/HDL Ratio   TSH   Obesity    Recommended eating smaller high protein, low fat meals more frequently and exercising 30 mins a day 5 times a week with a goal of 10-15lb weight loss in the next 3 months. Patient voiced their understanding and motivation to adhere to these recommendations.        Other Visit Diagnoses    Encounter for annual physical exam       Annual labs today to include CBC, CMP, TSH, lipid   Essential hypertension       Relevant Orders   Comprehensive metabolic panel   CBC with Differential/Platelet   Onychomycosis       Referral to podiatry per request   Relevant Orders   Ambulatory referral to  Podiatry       Discussed aspirin prophylaxis for myocardial infarction prevention and decision was made to continue ASA  LABORATORY TESTING:  Health maintenance labs ordered today as discussed above.   The natural history of prostate cancer and ongoing controversy regarding screening and potential treatment outcomes of prostate cancer has been discussed with the patient. The meaning of a false positive PSA and a false negative PSA has been discussed. He indicates understanding of the limitations of this screening test and wishes to proceed with screening PSA testing.   IMMUNIZATIONS:   - Tdap: Tetanus vaccination status reviewed: last tetanus booster within 10 years. - Influenza: Up to date - Pneumovax: Up to date - Prevnar: Up to date - Zostavax vaccine: Up to date  SCREENING: - Colonoscopy: Up to date  Discussed with patient purpose of the colonoscopy is to detect colon cancer at curable precancerous or early stages   - AAA Screening: Not applicable  -Hearing Test: Not applicable  -Spirometry: Not applicable   PATIENT COUNSELING:    Sexuality: Discussed sexually transmitted diseases, partner selection, use of condoms, avoidance of unintended pregnancy  and contraceptive alternatives.  Advised to avoid cigarette smoking.  I discussed with the patient that most people either abstain from alcohol or drink within safe limits (<=14/week and <=4 drinks/occasion for males, <=7/weeks and <= 3 drinks/occasion for females) and that the risk for alcohol disorders and other health effects rises proportionally with the number of drinks per week and how often a drinker exceeds daily limits.  Discussed cessation/primary prevention of drug use and availability of treatment for abuse.   Diet: Encouraged to adjust caloric intake to maintain  or achieve ideal body weight, to reduce intake of dietary saturated fat and total fat, to limit sodium intake by avoiding high sodium foods and not adding  table salt, and to maintain adequate dietary potassium and calcium preferably from fresh fruits, vegetables, and low-fat dairy products.    Stressed the importance of regular exercise  Injury prevention: Discussed safety belts, safety helmets, smoke detector, smoking near bedding or upholstery.   Dental health: Discussed importance of regular tooth brushing, flossing, and dental visits.   Follow up plan: NEXT PREVENTATIVE PHYSICAL DUE IN 1 YEAR. Return in about 6 months (around 06/11/2020) for HTN/HLD, PREDIABETES.

## 2019-12-12 NOTE — Assessment & Plan Note (Signed)
Chronic, stable without medication at this time.  PSA today. 

## 2019-12-12 NOTE — Patient Instructions (Signed)
Healthy Eating °Following a healthy eating pattern may help you to achieve and maintain a healthy body weight, reduce the risk of chronic disease, and live a long and productive life. It is important to follow a healthy eating pattern at an appropriate calorie level for your body. Your nutritional needs should be met primarily through food by choosing a variety of nutrient-rich foods. °What are tips for following this plan? °Reading food labels °· Read labels and choose the following: °? Reduced or low sodium. °? Juices with 100% fruit juice. °? Foods with low saturated fats and high polyunsaturated and monounsaturated fats. °? Foods with whole grains, such as whole wheat, cracked wheat, brown rice, and wild rice. °? Whole grains that are fortified with folic acid. This is recommended for women who are pregnant or who want to become pregnant. °· Read labels and avoid the following: °? Foods with a lot of added sugars. These include foods that contain brown sugar, corn sweetener, corn syrup, dextrose, fructose, glucose, high-fructose corn syrup, honey, invert sugar, lactose, malt syrup, maltose, molasses, raw sugar, sucrose, trehalose, or turbinado sugar. °§ Do not eat more than the following amounts of added sugar per day: °§ 6 teaspoons (25 g) for women. °§ 9 teaspoons (38 g) for men. °? Foods that contain processed or refined starches and grains. °? Refined grain products, such as white flour, degermed cornmeal, white bread, and white rice. °Shopping °· Choose nutrient-rich snacks, such as vegetables, whole fruits, and nuts. Avoid high-calorie and high-sugar snacks, such as potato chips, fruit snacks, and candy. °· Use oil-based dressings and spreads on foods instead of solid fats such as butter, stick margarine, or cream cheese. °· Limit pre-made sauces, mixes, and "instant" products such as flavored rice, instant noodles, and ready-made pasta. °· Try more plant-protein sources, such as tofu, tempeh, black beans,  edamame, lentils, nuts, and seeds. °· Explore eating plans such as the Mediterranean diet or vegetarian diet. °Cooking °· Use oil to sauté or stir-fry foods instead of solid fats such as butter, stick margarine, or lard. °· Try baking, boiling, grilling, or broiling instead of frying. °· Remove the fatty part of meats before cooking. °· Steam vegetables in water or broth. °Meal planning ° °· At meals, imagine dividing your plate into fourths: °? One-half of your plate is fruits and vegetables. °? One-fourth of your plate is whole grains. °? One-fourth of your plate is protein, especially lean meats, poultry, eggs, tofu, beans, or nuts. °· Include low-fat dairy as part of your daily diet. °Lifestyle °· Choose healthy options in all settings, including home, work, school, restaurants, or stores. °· Prepare your food safely: °? Wash your hands after handling raw meats. °? Keep food preparation surfaces clean by regularly washing with hot, soapy water. °? Keep raw meats separate from ready-to-eat foods, such as fruits and vegetables. °? Cook seafood, meat, poultry, and eggs to the recommended internal temperature. °? Store foods at safe temperatures. In general: °§ Keep cold foods at 40°F (4.4°C) or below. °§ Keep hot foods at 140°F (60°C) or above. °§ Keep your freezer at 0°F (-17.8°C) or below. °§ Foods are no longer safe to eat when they have been between the temperatures of 40°-140°F (4.4-60°C) for more than 2 hours. °What foods should I eat? °Fruits °Aim to eat 2 cup-equivalents of fresh, canned (in natural juice), or frozen fruits each day. Examples of 1 cup-equivalent of fruit include 1 small apple, 8 large strawberries, 1 cup canned fruit, ½ cup   dried fruit, or 1 cup 100% juice. Vegetables Aim to eat 2-3 cup-equivalents of fresh and frozen vegetables each day, including different varieties and colors. Examples of 1 cup-equivalent of vegetables include 2 medium carrots, 2 cups raw, leafy greens, 1 cup chopped  vegetable (raw or cooked), or 1 medium baked potato. Grains Aim to eat 6 ounce-equivalents of whole grains each day. Examples of 1 ounce-equivalent of grains include 1 slice of bread, 1 cup ready-to-eat cereal, 3 cups popcorn, or  cup cooked rice, pasta, or cereal. Meats and other proteins Aim to eat 5-6 ounce-equivalents of protein each day. Examples of 1 ounce-equivalent of protein include 1 egg, 1/2 cup nuts or seeds, or 1 tablespoon (16 g) peanut butter. A cut of meat or fish that is the size of a deck of cards is about 3-4 ounce-equivalents.  Of the protein you eat each week, try to have at least 8 ounces come from seafood. This includes salmon, trout, herring, and anchovies. Dairy Aim to eat 3 cup-equivalents of fat-free or low-fat dairy each day. Examples of 1 cup-equivalent of dairy include 1 cup (240 mL) milk, 8 ounces (250 g) yogurt, 1 ounces (44 g) natural cheese, or 1 cup (240 mL) fortified soy milk. Fats and oils  Aim for about 5 teaspoons (21 g) per day. Choose monounsaturated fats, such as canola and olive oils, avocados, peanut butter, and most nuts, or polyunsaturated fats, such as sunflower, corn, and soybean oils, walnuts, pine nuts, sesame seeds, sunflower seeds, and flaxseed. Beverages  Aim for six 8-oz glasses of water per day. Limit coffee to three to five 8-oz cups per day.  Limit caffeinated beverages that have added calories, such as soda and energy drinks.  Limit alcohol intake to no more than 1 drink a day for nonpregnant women and 2 drinks a day for men. One drink equals 12 oz of beer (355 mL), 5 oz of wine (148 mL), or 1 oz of hard liquor (44 mL). Seasoning and other foods  Avoid adding excess amounts of salt to your foods. Try flavoring foods with herbs and spices instead of salt.  Avoid adding sugar to foods.  Try using oil-based dressings, sauces, and spreads instead of solid fats. This information is based on general [place] nutrition guidelines. For more  information, visit [employer]. Exact amounts may vary based on your nutrition needs. Summary  A healthy eating plan may help you to maintain a healthy weight, reduce the risk of chronic diseases, and stay active throughout your life.  Plan your meals. Make sure you eat the right portions of a variety of nutrient-rich foods.  Try baking, boiling, grilling, or broiling instead of frying.  Choose healthy options in all settings, including home, work, school, restaurants, or stores. This information is not intended to replace advice given to you by your health care provider. Make sure you discuss any questions you have with your health care provider. Document Revised: 05/14/2017 Document Reviewed: 05/14/2017 Elsevier Patient Education  [place].

## 2019-12-12 NOTE — Assessment & Plan Note (Signed)
Chronic, ongoing.  Continue daily statin and adjust dose as needed. Recommend he take a daily ASA 81 MG for prevention.  Return in 6 months.  Lipid panel today. ?

## 2019-12-13 LAB — CBC WITH DIFFERENTIAL/PLATELET
Basophils Absolute: 0 10*3/uL (ref 0.0–0.2)
Basos: 1 %
EOS (ABSOLUTE): 0.2 10*3/uL (ref 0.0–0.4)
Eos: 5 %
Hematocrit: 42.2 % (ref 37.5–51.0)
Hemoglobin: 14.1 g/dL (ref 13.0–17.7)
Immature Grans (Abs): 0 10*3/uL (ref 0.0–0.1)
Immature Granulocytes: 0 %
Lymphocytes Absolute: 1.6 10*3/uL (ref 0.7–3.1)
Lymphs: 38 %
MCH: 32 pg (ref 26.6–33.0)
MCHC: 33.4 g/dL (ref 31.5–35.7)
MCV: 96 fL (ref 79–97)
Monocytes Absolute: 0.5 10*3/uL (ref 0.1–0.9)
Monocytes: 12 %
Neutrophils Absolute: 1.9 10*3/uL (ref 1.4–7.0)
Neutrophils: 44 %
Platelets: 187 10*3/uL (ref 150–450)
RBC: 4.4 x10E6/uL (ref 4.14–5.80)
RDW: 12.3 % (ref 11.6–15.4)
WBC: 4.3 10*3/uL (ref 3.4–10.8)

## 2019-12-13 LAB — COMPREHENSIVE METABOLIC PANEL
ALT: 15 IU/L (ref 0–44)
AST: 19 IU/L (ref 0–40)
Albumin/Globulin Ratio: 1.7 (ref 1.2–2.2)
Albumin: 4.3 g/dL (ref 3.7–4.7)
Alkaline Phosphatase: 65 IU/L (ref 44–121)
BUN/Creatinine Ratio: 13 (ref 10–24)
BUN: 12 mg/dL (ref 8–27)
Bilirubin Total: 0.4 mg/dL (ref 0.0–1.2)
CO2: 25 mmol/L (ref 20–29)
Calcium: 9.1 mg/dL (ref 8.6–10.2)
Chloride: 104 mmol/L (ref 96–106)
Creatinine, Ser: 0.91 mg/dL (ref 0.76–1.27)
GFR calc Af Amer: 97 mL/min/{1.73_m2} (ref >59)
GFR calc non Af Amer: 84 mL/min/{1.73_m2} (ref >59)
Globulin, Total: 2.5 g/dL (ref 1.5–4.5)
Glucose: 105 mg/dL — ABNORMAL HIGH (ref 65–99)
Potassium: 4.1 mmol/L (ref 3.5–5.2)
Sodium: 142 mmol/L (ref 134–144)
Total Protein: 6.8 g/dL (ref 6.0–8.5)

## 2019-12-13 LAB — LIPID PANEL WITH LDL/HDL RATIO
Cholesterol, Total: 150 mg/dL (ref 100–199)
HDL: 56 mg/dL (ref >39)
LDL Chol Calc (NIH): 81 mg/dL (ref 0–99)
LDL/HDL Ratio: 1.4 ratio (ref 0.0–3.6)
Triglycerides: 65 mg/dL (ref 0–149)
VLDL Cholesterol Cal: 13 mg/dL (ref 5–40)

## 2019-12-13 LAB — TSH: TSH: 1.36 u[IU]/mL (ref 0.450–4.500)

## 2019-12-13 LAB — PSA: Prostate Specific Ag, Serum: 3.1 ng/mL (ref 0.0–4.0)

## 2019-12-13 LAB — HEMOGLOBIN A1C
Est. average glucose Bld gHb Est-mCnc: 134 mg/dL
Hgb A1c MFr Bld: 6.3 % — ABNORMAL HIGH (ref 4.8–5.6)

## 2019-12-15 NOTE — Progress Notes (Signed)
Good morning, please let Derek Wright know his labs have returned.  Overall they remain stable with no major concerns noted.  A1C does remain in prediabetic range and is slightly higher this check at 6.3%.  Any number 5.7 to 6.4 is considered prediabetes and any number 6.5 or greater is considered diabetes.   I would recommend heavy focus on decreasing foods high in sugar and your intake of things like bread products, pasta, and rice.  The American Diabetes Association online has a large amount of information on diet changes to make.  We will recheck this number in 3 months to ensure you are not continuing to trend upwards and move into diabetes.  Have a good day. Keep being awesome!!  Thank you for allowing me to participate in your care. Kindest regards, Durant Scibilia

## 2019-12-16 ENCOUNTER — Other Ambulatory Visit: Payer: Self-pay | Admitting: Nurse Practitioner

## 2019-12-16 ENCOUNTER — Encounter: Payer: Self-pay | Admitting: Podiatry

## 2019-12-16 ENCOUNTER — Ambulatory Visit: Payer: Medicare Other | Admitting: Podiatry

## 2019-12-16 ENCOUNTER — Other Ambulatory Visit: Payer: Self-pay

## 2019-12-16 DIAGNOSIS — L600 Ingrowing nail: Secondary | ICD-10-CM

## 2019-12-16 NOTE — Patient Instructions (Signed)

## 2019-12-16 NOTE — Progress Notes (Signed)
Subjective:  [name], male    DOB: [birth year],  MRN: [id number]  Chief Complaint  Patient presents with  . Nail Problem    Patient presents today for ingrown toenails bilat hallux both borders x years off and on    74 y.o. male presents with the above complaint.  Patient presents with complaint of bilateral entire nail pain that has been going on for quite some time.  Patient states both nails are thick and have procedures done in the past for an ingrown.  Patient states that they have come back and is very painful on both corners and the entire nail.  He would like to have both of the nails removed.  He denies any other acute complaints.  He has not seen anyone else prior to seeing me.  He had the ingrown nail procedure done about 7 to 8 years ago.  He states that now they are pincer style nail.  He would like to have them removed.  He would like to make it the corners/ingrown part permanent.   Review of Systems: Negative except as noted in the HPI. Denies N/V/F/Ch.  Past Medical History:  Diagnosis Date  . Arthritis   . GERD (gastroesophageal reflux disease)    RARE-NO MEDS  . Pre-diabetes   . Sleep apnea    MILD-NO CPAP-COULD NOT TOELERATE    Current Outpatient Medications:  .  atorvastatin (LIPITOR) 20 MG tablet, Take 1 tablet (20 mg total) by mouth daily., Disp: 90 tablet, Rfl: 3 .  metoprolol succinate (TOPROL-XL) 25 MG 24 hr tablet, TAKE ONE TABLET DAILY AS NEEDED., Disp: 30 tablet, Rfl: 0 .  omeprazole (PRILOSEC) 20 MG capsule, Take 1 capsule (20 mg total) by mouth daily., Disp: 90 capsule, Rfl: 0  Social History   Tobacco Use  Smoking Status Former Smoker  . Packs/day: 0.25  . Years: 15.00  . Pack years: 3.75  . Types: Cigarettes  . Quit date: 09/13/1980  . Years since quitting: 39.2  Smokeless Tobacco Never Used    No Known Allergies Objective:  There were no vitals filed for this visit. There is no height or weight on file to calculate  BMI. Constitutional Well developed. Well nourished.  Vascular Dorsalis pedis pulses palpable bilaterally. Posterior tibial pulses palpable bilaterally. Capillary refill normal to all digits.  No cyanosis or clubbing noted. Pedal hair growth normal.  Neurologic Normal speech. Oriented to person, place, and time. Epicritic sensation to light touch grossly present bilaterally.  Dermatologic Pain on palpation of the entire/total nail on 1st digit of the bilaterally No other open wounds. No skin lesions.  Orthopedic: Normal joint ROM without pain or crepitus bilaterally. No visible deformities. No bony tenderness.   Radiographs: None Assessment:   1. Ingrown toenail of right foot   2. Ingrown left big toenail    Plan:  Patient was evaluated and treated and all questions answered.  Nail contusion/dystrophy hallux, bilaterally -Patient elects to proceed with minor surgery to remove entire toenail today. Consent reviewed and signed by patient. -Entire/total nail excised. See procedure note. -Educated on post-procedure care including soaking. Written instructions provided and reviewed. -Patient to follow up in 2 weeks for nail check.  Procedure: Excision of entire/total nail  Location: Bilateral 1st toe digit Anesthesia: Lidocaine 1% plain; 1.5 mL and Marcaine 0.5% plain; 1.5 mL, digital block. Skin Prep: Betadine. Dressing: Silvadene; telfa; dry, sterile, compression dressing. Technique: Following skin prep, the toe was exsanguinated and a tourniquet was secured at  the base of the toe. The affected nail border was freed and excised.  Phenol matricectomy was performed to the medial and lateral borders of both hallux in standard technique.  The tourniquet was then removed and sterile dressing applied. Disposition: Patient tolerated procedure well. Patient to return in 2 weeks for follow-up.   No follow-ups on file.

## 2019-12-16 NOTE — Telephone Encounter (Signed)
Requested Prescriptions  Pending Prescriptions Disp Refills   metoprolol succinate (TOPROL-XL) 25 MG 24 hr tablet [Pharmacy Med Name: METOPROLOL SUCC ER 25 MG TAB] 90 tablet 0    Sig: TAKE ONE TABLET DAILY AS NEEDED.     Cardiovascular:  Beta Blockers Passed - 12/16/2019  8:36 AM      Passed - Last BP in normal range    BP Readings from Last 1 Encounters:  12/12/19 112/72         Passed - Last Heart Rate in normal range    Pulse Readings from Last 1 Encounters:  12/12/19 (!) 57         Passed - Valid encounter within last 6 months    Recent Outpatient Visits          4 days ago Atherosclerosis of aorta ([place])   [place], [place] T, NP   3 months ago Skin cyst   [place], Henrine Screws T, NP   5 months ago Atherosclerosis of aorta ([place])   [place] [facility], Barbaraann Faster, NP   7 months ago Essential hypertension   [place], [place] T, NP   11 months ago Cellulitis of finger of right hand   [place], [place] T, NP      Future Appointments            In 6 months Cannady, Barbaraann Faster, NP [facility]            omeprazole (PRILOSEC) 20 MG capsule [Pharmacy Med Name: OMEPRAZOLE DR 20 MG CAPSULE] 90 capsule 1    Sig: Take 1 capsule (20 mg total) by mouth daily.     Gastroenterology: Proton Pump Inhibitors Passed - 12/16/2019  8:36 AM      Passed - Valid encounter within last 12 months    Recent Outpatient Visits          4 days ago Atherosclerosis of aorta ([place])   [place], Barbaraann Faster, NP   3 months ago Skin cyst   [place], Henrine Screws T, NP   5 months ago Atherosclerosis of aorta ([place])   [place] Cannady, Barbaraann Faster, NP   7 months ago Essential hypertension   [place], [place] T, NP   11 months ago Cellulitis of finger of right hand   [place], Barbaraann Faster, NP       Future Appointments            In 6 months Cannady, Barbaraann Faster, NP [facility]

## 2019-12-30 ENCOUNTER — Ambulatory Visit: Payer: Medicare Other | Admitting: Podiatry

## 2020-01-15 ENCOUNTER — Ambulatory Visit: Payer: Medicare Other | Admitting: Podiatry

## 2020-01-26 ENCOUNTER — Ambulatory Visit (INDEPENDENT_AMBULATORY_CARE_PROVIDER_SITE_OTHER): Payer: Medicare Other

## 2020-01-26 VITALS — Ht 74.5 in | Wt 270.0 lb

## 2020-01-26 DIAGNOSIS — Z0000 Encounter for general adult medical examination without abnormal findings: Secondary | ICD-10-CM | POA: Diagnosis not present

## 2020-01-26 NOTE — Patient Instructions (Signed)
Mr. Derek Wright , Thank you for taking time to come for your Medicare Wellness Visit. I appreciate your ongoing commitment to your health goals. Please review the following plan we discussed and let me know if I can assist you in the future.   Screening recommendations/referrals: Colonoscopy: completed 10/26/2010, due 10/25/2020 Recommended yearly ophthalmology/optometry visit for glaucoma screening and checkup Recommended yearly dental visit for hygiene and checkup  Vaccinations: Influenza vaccine: completed 11/14/2019, due 09/13/2020 Pneumococcal vaccine: completed 08/20/2017 Tdap vaccine: completed 05/08/2013, due 05/09/2023 Shingles vaccine:  discussed   Covid-19:    Advanced directives: completed  Conditions/risks identified: none  Next appointment: Follow up in one year for your annual wellness visit.   Preventive Care 72 Years and Older, Male Preventive care refers to lifestyle choices and visits with your health care provider that can promote health and wellness. What does preventive care include?  A yearly physical exam. This is also called an annual well check.  Dental exams once or twice a year.  Routine eye exams. Ask your health care provider how often you should have your eyes checked.  Personal lifestyle choices, including:  Daily care of your teeth and gums.  Regular physical activity.  Eating a healthy diet.  Avoiding tobacco and drug use.  Limiting alcohol use.  Practicing safe sex.  Taking low doses of aspirin every day.  Taking vitamin and mineral supplements as recommended by your health care provider. What happens during an annual well check? The services and screenings done by your health care provider during your annual well check will depend on your age, overall health, lifestyle risk factors, and family history of disease. Counseling  Your health care provider may ask you questions about your:  Alcohol use.  Tobacco use.  Drug use.  Emotional  well-being.  Home and relationship well-being.  Sexual activity.  Eating habits.  History of falls.  Memory and ability to understand (cognition).  Work and work Statistician. Screening  You may have the following tests or measurements:  Height, weight, and BMI.  Blood pressure.  Lipid and cholesterol levels. These may be checked every 5 years, or more frequently if you are over 60 years old.  Skin check.  Lung cancer screening. You may have this screening every year starting at age 43 if you have a 30-pack-year history of smoking and currently smoke or have quit within the past 15 years.  Fecal occult blood test (FOBT) of the stool. You may have this test every year starting at age 63.  Flexible sigmoidoscopy or colonoscopy. You may have a sigmoidoscopy every 5 years or a colonoscopy every 10 years starting at age 58.  Prostate cancer screening. Recommendations will vary depending on your family history and other risks.  Hepatitis C blood test.  Hepatitis B blood test.  Sexually transmitted disease (STD) testing.  Diabetes screening. This is done by checking your blood sugar (glucose) after you have not eaten for a while (fasting). You may have this done every 1-3 years.  Abdominal aortic aneurysm (AAA) screening. You may need this if you are a current or former smoker.  Osteoporosis. You may be screened starting at age 50 if you are at high risk. Talk with your health care provider about your test results, treatment options, and if necessary, the need for more tests. Vaccines  Your health care provider may recommend certain vaccines, such as:  Influenza vaccine. This is recommended every year.  Tetanus, diphtheria, and acellular pertussis (Tdap, Td) vaccine. You may need  a Td booster every 10 years.  Zoster vaccine. You may need this after age 53.  Pneumococcal 13-valent conjugate (PCV13) vaccine. One dose is recommended after age 68.  Pneumococcal  polysaccharide (PPSV23) vaccine. One dose is recommended after age 20. Talk to your health care provider about which screenings and vaccines you need and how often you need them. This information is not intended to replace advice given to you by your health care provider. Make sure you discuss any questions you have with your health care provider. Document Released: 02/26/2015 Document Revised: 10/20/2015 Document Reviewed: 12/01/2014 Elsevier Interactive Patient Education  2017 [employer] Prevention in the Home Falls can cause injuries. They can happen to people of all ages. There are many things you can do to make your home safe and to help prevent falls. What can I do on the outside of my home?  Regularly fix the edges of walkways and driveways and fix any cracks.  Remove anything that might make you trip as you walk through a door, such as a raised step or threshold.  Trim any bushes or trees on the path to your home.  Use bright outdoor lighting.  Clear any walking paths of anything that might make someone trip, such as rocks or tools.  Regularly check to see if handrails are loose or broken. Make sure that both sides of any steps have handrails.  Any raised decks and porches should have guardrails on the edges.  Have any leaves, snow, or ice cleared regularly.  Use sand or salt on walking paths during winter.  Clean up any spills in your garage right away. This includes oil or grease spills. What can I do in the bathroom?  Use night lights.  Install grab bars by the toilet and in the tub and shower. Do not use towel bars as grab bars.  Use non-skid mats or decals in the tub or shower.  If you need to sit down in the shower, use a plastic, non-slip stool.  Keep the floor dry. Clean up any water that spills on the floor as soon as it happens.  Remove soap buildup in the tub or shower regularly.  Attach bath mats securely with double-sided non-slip rug  tape.  Do not have throw rugs and other things on the floor that can make you trip. What can I do in the bedroom?  Use night lights.  Make sure that you have a light by your bed that is easy to reach.  Do not use any sheets or blankets that are too big for your bed. They should not hang down onto the floor.  Have a firm chair that has side arms. You can use this for support while you get dressed.  Do not have throw rugs and other things on the floor that can make you trip. What can I do in the kitchen?  Clean up any spills right away.  Avoid walking on wet floors.  Keep items that you use a lot in easy-to-reach places.  If you need to reach something above you, use a strong step stool that has a grab bar.  Keep electrical cords out of the way.  Do not use floor polish or wax that makes floors slippery. If you must use wax, use non-skid floor wax.  Do not have throw rugs and other things on the floor that can make you trip. What can I do with my stairs?  Do not leave any items on the  stairs.  Make sure that there are handrails on both sides of the stairs and use them. Fix handrails that are broken or loose. Make sure that handrails are as long as the stairways.  Check any carpeting to make sure that it is firmly attached to the stairs. Fix any carpet that is loose or worn.  Avoid having throw rugs at the top or bottom of the stairs. If you do have throw rugs, attach them to the floor with carpet tape.  Make sure that you have a light switch at the top of the stairs and the bottom of the stairs. If you do not have them, ask someone to add them for you. What else can I do to help prevent falls?  Wear shoes that:  Do not have high heels.  Have rubber bottoms.  Are comfortable and fit you well.  Are closed at the toe. Do not wear sandals.  If you use a stepladder:  Make sure that it is fully opened. Do not climb a closed stepladder.  Make sure that both sides of the  stepladder are locked into place.  Ask someone to hold it for you, if possible.  Clearly mark and make sure that you can see:  Any grab bars or handrails.  First and last steps.  Where the edge of each step is.  Use tools that help you move around (mobility aids) if they are needed. These include:  Canes.  Walkers.  Scooters.  Crutches.  Turn on the lights when you go into a dark area. Replace any light bulbs as soon as they burn out.  Set up your furniture so you have a clear path. Avoid moving your furniture around.  If any of your floors are uneven, fix them.  If there are any pets around you, be aware of where they are.  Review your medicines with your doctor. Some medicines can make you feel dizzy. This can increase your chance of falling. Ask your doctor what other things that you can do to help prevent falls. This information is not intended to replace advice given to you by your health care provider. Make sure you discuss any questions you have with your health care provider. Document Released: 11/26/2008 Document Revised: 07/08/2015 Document Reviewed: 03/06/2014 Elsevier Interactive Patient Education  2017 [employer].

## 2020-01-26 NOTE — Progress Notes (Signed)
I connected with Derek Wright today by telephone and verified that I am speaking with the correct person using two identifiers. Location patient: home Location provider: work Persons participating in the virtual visit: Derek Wright, Derek Wright.   I discussed the limitations, risks, security and privacy concerns of performing an evaluation and management service by telephone and the availability of in person appointments. I also discussed with the patient that there may be a patient responsible charge related to this service. The patient expressed understanding and verbally consented to this telephonic visit.    Interactive audio and video telecommunications were attempted between this provider and patient, however failed, due to patient having technical difficulties OR patient did not have access to video capability.  We continued and completed visit with audio only.     Vital signs may be patient reported or missing.   Subjective:   Derek Wright is a 74 y.o. male who presents for Medicare Annual/Subsequent preventive examination.  Review of Systems     Cardiac Risk Factors include: diabetes mellitus;dyslipidemia;hypertension;male gender;obesity (BMI >30kg/m2);sedentary lifestyle     Objective:    Today's Vitals   01/26/20 1426  Weight: 270 lb (122.5 kg)  Height: 6' 2.5" (1.892 m)   Body mass index is 34.2 kg/m.  Advanced Directives 01/26/2020 09/02/2017 09/01/2017 08/20/2017 08/17/2016 02/29/2016 01/01/2016  Does Patient Have a Medical Advance Directive? Yes Yes Yes Yes Yes Yes Yes  Type of Paramedic of [place];Living will [place];Living will - Living will;Healthcare Power of [place];Living will Living will [place];Living will  Does patient want to make changes to medical advance directive? - - - - - - No - Patient declined  Copy of [place] in Chart? No - copy  requested No - copy requested - No - copy requested No - copy requested - No - copy requested    Current Medications (verified) Outpatient Encounter Medications as of 01/26/2020  Medication Sig  . atorvastatin (LIPITOR) 20 MG tablet Take 1 tablet (20 mg total) by mouth daily.  . metoprolol succinate (TOPROL-XL) 25 MG 24 hr tablet TAKE ONE TABLET DAILY AS NEEDED.  Marland Kitchen omeprazole (PRILOSEC) 20 MG capsule Take 1 capsule (20 mg total) by mouth daily.  . [DISCONTINUED] metoprolol succinate (TOPROL-XL) 25 MG 24 hr tablet TAKE ONE TABLET DAILY AS NEEDED.   No facility-administered encounter medications on file as of 01/26/2020.    Allergies (verified) Patient has no known allergies.   History: Past Medical History:  Diagnosis Date  . Arthritis   . GERD (gastroesophageal reflux disease)    RARE-NO MEDS  . Pre-diabetes   . Sleep apnea    MILD-NO CPAP-COULD NOT TOELERATE   Past Surgical History:  Procedure Laterality Date  . COLONOSCOPY  4132  . hair folicle removed    . INGUINAL HERNIA REPAIR Bilateral 09/29/2015   Procedure: LAPAROSCOPIC BILATERAL INGUINAL HERNIA REPAIR;  Surgeon: Robert Bellow, MD;  Location: [facility] ORS;  Service: General;  Laterality: Bilateral;  . SKIN GRAFT Right 1966   hand   Family History  Problem Relation Age of Onset  . Ovarian cancer Mother   . Heart disease Father   . Lymphoma Maternal Uncle    Social History   Socioeconomic History  . Marital status: Married    Spouse name: Not on file  . Number of children: Not on file  . Years of education: 84  . Highest education level: High  school graduate  Occupational History  . Not on file  Tobacco Use  . Smoking status: Former Smoker    Packs/day: 0.25    Years: 15.00    Pack years: 3.75    Types: Cigarettes    Quit date: 09/13/1980    Years since quitting: 39.3  . Smokeless tobacco: Never Used  Vaping Use  . Vaping Use: Never used  Substance and Sexual Activity  . Alcohol use: Yes     Alcohol/week: 2.0 standard drinks    Types: 2 Cans of beer per week  . Drug use: No  . Sexual activity: Not on file  Other Topics Concern  . Not on file  Social History Narrative  . Not on file   Social Determinants of Health   Financial Resource Strain: Low Risk   . Difficulty of Paying Living Expenses: Not hard at all  Food Insecurity: No Food Insecurity  . Worried About Charity fundraiser in the Last Year: Never true  . Ran Out of Food in the Last Year: Never true  Transportation Needs: No Transportation Needs  . Lack of Transportation (Medical): No  . Lack of Transportation (Non-Medical): No  Physical Activity: Inactive  . Days of Exercise per Week: 0 days  . Minutes of Exercise per Session: 0 min  Stress: No Stress Concern Present  . Feeling of Stress : Not at all  Social Connections: Not on file    Tobacco Counseling Counseling given: Not Answered   Clinical Intake:  Pre-visit preparation completed: Yes  Pain : No/denies pain     Nutritional Status: BMI > 30  Obese Nutritional Risks: None Diabetes: Yes  How often do you need to have someone help you when you read instructions, pamphlets, or other written materials from your doctor or pharmacy?: 1 - Never What is the last grade level you completed in school?: GED  Diabetic? Yes Nutrition Risk Assessment:  Has the patient had any N/V/D within the last 2 months?  No  Does the patient have any non-healing wounds?  No  Has the patient had any unintentional weight loss or weight gain?  No   Diabetes:  Is the patient diabetic?  Yes  If diabetic, was a CBG obtained today?  No  Did the patient bring in their glucometer from home?  No  How often do you monitor your CBG's? ocasionally.   Financial Strains and Diabetes Management:  Are you having any financial strains with the device, your supplies or your medication? No .  Does the patient want to be seen by Chronic Care Management for management of their  diabetes?  No  Would the patient like to be referred to a Nutritionist or for Diabetic Management?  No   Diabetic Exams:  Diabetic Eye Exam: Completed 12/12/2019 Diabetic Foot Exam: Completed 12/12/2019  Interpreter Needed?: No  Information entered by :: NAllen Wright   Activities of Daily Living In your present state of health, do you have any difficulty performing the following activities: 01/26/2020 12/12/2019  Hearing? N N  Vision? N N  Difficulty concentrating or making decisions? N N  Walking or climbing stairs? N N  Dressing or bathing? N N  Doing errands, shopping? N N  Preparing Food and eating ? N -  Using the Toilet? N -  In the past six months, have you accidently leaked urine? N -  Do you have problems with loss of bowel control? N -  Managing your Medications? N -  Managing your Finances? N -  Housekeeping or managing your Housekeeping? N -  Some recent data might be hidden    Patient Care Team: Venita Lick, NP as PCP - General (Nurse Practitioner) Kem Parkinson, MD (Ophthalmology) Sharene Butters, MD (General Surgery) Yolonda Kida, MD as Consulting Physician (Cardiology)  Indicate any recent Medical Services you may have received from other than [facility] providers in the past year (date may be approximate).     Assessment:   This is a routine wellness examination for Y[place].  Hearing/Vision screen  Hearing Screening   125Hz  250Hz  500Hz  1000Hz  2000Hz  3000Hz  4000Hz  6000Hz  8000Hz   Right ear:           Left ear:           Vision Screening Comments: Regular eye exams, [facility]  Dietary issues and exercise activities discussed: Current Exercise Habits: The patient does not participate in regular exercise at present  Goals    . DIET - INCREASE WATER INTAKE     Recommend drinking at least 6-8 glasses of water a day     . Increase water intake     Recommend drinking at least 4-5 glasses of water a day    . Patient Stated      01/26/2020, no goals      Depression Screen PHQ 2/9 Scores 01/26/2020 12/12/2019 10/09/2018 08/20/2017 12/12/2016 09/13/2016 08/17/2016  PHQ - 2 Score 0 0 0 0 0 0 0  PHQ- 9 Score - - 2 - - - -    Fall Risk Fall Risk  01/26/2020 12/12/2019 10/09/2018 08/20/2017 12/12/2016  Falls in the past year? 0 0 0 Yes No  Number falls in past yr: - 0 0 1 -  Injury with Fall? - 0 0 No -  Risk for fall due to : Medication side effect No Fall Risks - - -  Follow up Falls evaluation completed;Education provided;Falls prevention discussed Falls evaluation completed Falls evaluation completed Falls evaluation completed -    FALL RISK PREVENTION PERTAINING TO THE HOME:  Any stairs in or around the home? Yes  If so, are there any without handrails? No  Home free of loose throw rugs in walkways, pet beds, electrical cords, etc? Yes  Adequate lighting in your home to reduce risk of falls? Yes   ASSISTIVE DEVICES UTILIZED TO PREVENT FALLS:  Life alert? No  Use of a cane, walker or w/c? No  Grab bars in the bathroom? No  Shower chair or bench in shower? No  Elevated toilet seat or a handicapped toilet? No   TIMED UP AND GO:  Was the test performed? No .     Cognitive Function:     6CIT Screen 01/26/2020 12/12/2019 10/09/2018 08/20/2017 08/17/2016  What Year? 0 points 0 points 0 points 0 points 0 points  What month? 0 points 0 points 0 points 0 points 0 points  What time? 0 points 0 points 0 points 0 points 0 points  Count back from 20 0 points 0 points 0 points 0 points 0 points  Months in reverse 0 points 0 points 0 points 0 points 0 points  Repeat phrase 0 points 0 points 0 points 0 points 0 points  Total Score 0 0 0 0 0    Immunizations Immunization History  Administered Date(s) Administered  . Fluad Quad(high Dose 65+) 10/09/2018, 10/27/2019, 11/14/2019  . Influenza,inj,Quad PF,6+ Mos 11/21/2016  . Influenza,inj,quad, With Preservative 12/01/2016  . Influenza-Unspecified 11/12/2013, 12/29/2014,  11/01/2015,  11/14/2017  . Moderna Sars-Covid-2 Vaccination 03/28/2019, 05/03/2019  . PFIZER SARS-COV-2 Vaccination 12/23/2019  . Pneumococcal Conjugate-13 09/13/2015  . Pneumococcal Polysaccharide-23 03/25/2009, 08/20/2017  . Pneumococcal-Unspecified 03/25/2009  . Tdap 05/08/2013  . Zoster 09/25/2011    TDAP status: Up to date  Flu Vaccine status: Up to date  Pneumococcal vaccine status: Up to date  Covid-19 vaccine status: Completed vaccines  Qualifies for Shingles Vaccine? Yes   Zostavax completed Yes   Shingrix Completed?: No.    Education has been provided regarding the importance of this vaccine. Patient has been advised to call insurance company to determine out of pocket expense if they have not yet received this vaccine. Advised may also receive vaccine at local pharmacy or Health Dept. Verbalized acceptance and understanding.  Screening Tests Health Maintenance  Topic Date Due  . HEMOGLOBIN A1C  06/11/2020  . COLONOSCOPY  10/25/2020  . FOOT EXAM  12/11/2020  . OPHTHALMOLOGY EXAM  12/11/2020  . URINE MICROALBUMIN  12/11/2020  . TETANUS/TDAP  05/09/2023  . INFLUENZA VACCINE  Completed  . COVID-19 Vaccine  Completed  . Hepatitis C Screening  Completed  . PNA vac Low Risk Adult  Completed    Health Maintenance  There are no preventive care reminders to display for this patient.  Colorectal cancer screening: Type of screening: Colonoscopy. Completed 10/26/2010. Repeat every 10 years  Lung Cancer Screening: (Low Dose CT Chest recommended if Age 51-80 years, 30 pack-year currently smoking OR have quit w/in 15years.) does not qualify.   Lung Cancer Screening Referral: no  Additional Screening:  Hepatitis C Screening: does qualify; Completed 09/13/2015  Vision Screening: Recommended annual ophthalmology exams for early detection of glaucoma and other disorders of the eye. Is the patient up to date with their annual eye exam?  Yes  Who is the provider or what is the  name of the office in which the patient attends annual eye exams? [facility] If pt is not established with a provider, would they like to be referred to a provider to establish care? No .   Dental Screening: Recommended annual dental exams for proper oral hygiene  Community Resource Referral / Chronic Care Management: CRR required this visit?  No   CCM required this visit?  No      Plan:     I have personally reviewed and noted the following in the patient's chart:   . Medical and social history . Use of alcohol, tobacco or illicit drugs  . Current medications and supplements . Functional ability and status . Nutritional status . Physical activity . Advanced directives . List of other physicians . Hospitalizations, surgeries, and ER visits in previous 12 months . Vitals . Screenings to include cognitive, depression, and falls . Referrals and appointments  In addition, I have reviewed and discussed with patient certain preventive protocols, quality metrics, and best practice recommendations. A written personalized care plan for preventive services as well as general preventive health recommendations were provided to patient.     Kellie Simmering, Wright   [id number]   Nurse Notes:

## 2020-04-22 ENCOUNTER — Other Ambulatory Visit: Payer: Self-pay | Admitting: Nurse Practitioner

## 2020-04-22 NOTE — Telephone Encounter (Signed)
Requested Prescriptions  Pending Prescriptions Disp Refills  . metoprolol succinate (TOPROL-XL) 25 MG 24 hr tablet [Pharmacy Med Name: METOPROLOL SUCC ER 25 MG TAB] 30 tablet 0    Sig: TAKE ONE TABLET DAILY AS NEEDED.     Cardiovascular:  Beta Blockers Passed - 04/22/2020 11:10 AM      Passed - Last BP in normal range    BP Readings from Last 1 Encounters:  12/12/19 112/72         Passed - Last Heart Rate in normal range    Pulse Readings from Last 1 Encounters:  12/12/19 (!) 57         Passed - Valid encounter within last 6 months    Recent Outpatient Visits          4 months ago Atherosclerosis of aorta ([place])   [place], [place] T, NP   7 months ago Skin cyst   [place], Henrine Screws T, NP   9 months ago Atherosclerosis of aorta ([place])   [place] Cannady, Barbaraann Faster, NP   11 months ago Essential hypertension   [place], Ruffin T, NP   1 year ago Cellulitis of finger of right hand   [place], Barbaraann Faster, NP      Future Appointments            In 1 month Cannady, Barbaraann Faster, NP [facility]   In 9 months  [facility]

## 2020-05-11 ENCOUNTER — Ambulatory Visit (INDEPENDENT_AMBULATORY_CARE_PROVIDER_SITE_OTHER): Payer: Medicare Other | Admitting: Nurse Practitioner

## 2020-05-11 ENCOUNTER — Encounter: Payer: Self-pay | Admitting: Nurse Practitioner

## 2020-05-11 DIAGNOSIS — J3489 Other specified disorders of nose and nasal sinuses: Secondary | ICD-10-CM | POA: Insufficient documentation

## 2020-05-11 MED ORDER — PREDNISONE 20 MG PO TABS: 40.0000 mg | ORAL_TABLET | Freq: Every day | ORAL | 0 refills | Status: AC

## 2020-05-11 NOTE — Progress Notes (Signed)
There were no vitals taken for this visit.   Subjective:    [name], male    DOB: 27-Feb-1945, 75 y.o.   MRN: [id number]  HPI: Derek Wright is a 75 y.o. male  Chief Complaint  Patient presents with  . Sinus Problem    Patient states he was having pain in his face and flu like symptoms and thinks he may have a sinus infection. Patient states he has been trying OTC medications.     . This visit was completed via telephone due to the restrictions of the COVID-19 pandemic. All issues as above were discussed and addressed but no physical exam was performed. If it was felt that the patient should be evaluated in the office, they were directed there. The patient verbally consented to this visit. Patient was unable to complete an audio/visual visit due to Technical difficulties, Lack of internet. Due to the catastrophic nature of the COVID-19 pandemic, this visit was done through audio contact only. . Location of the patient: home . Location of the provider: work . Those involved with this call:  . Provider: Marnee Guarneri, DNP . CMA: Yvonna Alanis, CMA . Front Desk/Registration: Jill Side  . Time spent on call: 21 minutes on the phone discussing health concerns. 15 minutes total spent in review of patient's record and preparation of their chart.  . I verified patient identity using two factors (patient name and date of birth). Patient consents verbally to being seen via telemedicine visit today.   UPPER RESPIRATORY TRACT INFECTION Stated started with symptoms last Wednesday, reports symptoms have been getting a whole lot better.  Is Covid vaccinated x 3. Fever: no Cough: yes occasional Shortness of breath: no Wheezing: no Chest pain: no Chest tightness: no Chest congestion: no Nasal congestion: no Runny nose: yes Post nasal drip: yes Sneezing: yes Sore throat: no Swollen glands: no Sinus pressure: yes Headache: yes Face pain: yes Toothache: no Ear pain:  none Ear pressure: none Eyes red/itching:no Eye drainage/crusting: no  Vomiting: no Rash: no Fatigue: no Sick contacts: no Strep contacts: no  Context: better Recurrent sinusitis: no Relief with OTC cold/cough medications: yes  Treatments attempted: cold/sinus -- Alka Seltzer and Vicks sinus  Relevant past medical, surgical, family and social history reviewed and updated as indicated. Interim medical history since our last visit reviewed. Allergies and medications reviewed and updated.  Review of Systems  Constitutional: Negative for activity change, appetite change, chills, diaphoresis, fatigue and fever.  HENT: Positive for postnasal drip, rhinorrhea, sinus pressure and sneezing. Negative for congestion, ear discharge, ear pain, sinus pain, sore throat and voice change.   Respiratory: Positive for cough. Negative for chest tightness, shortness of breath and wheezing.   Cardiovascular: Negative.   Gastrointestinal: Negative.   Neurological: Positive for headaches. Negative for dizziness, tremors, syncope, light-headedness and numbness.  Psychiatric/Behavioral: Negative.     Per HPI unless specifically indicated above     Objective:    There were no vitals taken for this visit.  Wt Readings from Last 3 Encounters:  01/26/20 270 lb (122.5 kg)  12/12/19 284 lb (128.8 kg)  07/01/19 275 lb (124.7 kg)    Physical Exam   Unable to perform due to telephone visit only.  Results for orders placed or performed in visit on 12/16/19  HM DIABETES EYE EXAM  Result Value Ref Range   HM Diabetic Eye Exam No Retinopathy No Retinopathy      Assessment & Plan:  Problem List Items Addressed This Visit      Other   Sinus pressure - Primary    Acute x 6 days with improvement.  Due to age and current Covid pandemic + he lives with higher risk patient, will obtain Covid testing at clinic.  Recommend he self quarantine until results returned.  At this time recommend avoid Alka Seltzer  Plus and change to Coricidin for symptom management, as to not affect BP levels.  Also recommend starting Claritin 10 MG daily by mouth, suspect some of this is allergy related.  Will send in 5 days of Prednisone 40 MG daily.  Discussed this may bump his sugars up a little for short period, to monitor closely.  Return as scheduled in April, sooner if worsening or ongoing symptoms.      Relevant Orders   Novel Coronavirus, NAA ([facility])       Follow up plan: Return for as scheduled in April.

## 2020-05-11 NOTE — Patient Instructions (Signed)

## 2020-05-11 NOTE — Assessment & Plan Note (Signed)
Acute x 6 days with improvement.  Due to age and current Covid pandemic + he lives with higher risk patient, will obtain Covid testing at clinic.  Recommend he self quarantine until results returned.  At this time recommend avoid Alka Seltzer Plus and change to Coricidin for symptom management, as to not affect BP levels.  Also recommend starting Claritin 10 MG daily by mouth, suspect some of this is allergy related.  Will send in 5 days of Prednisone 40 MG daily.  Discussed this may bump his sugars up a little for short period, to monitor closely.  Return as scheduled in April, sooner if worsening or ongoing symptoms.

## 2020-05-12 LAB — SARS-COV-2, NAA 2 DAY TAT

## 2020-05-12 LAB — NOVEL CORONAVIRUS, NAA: SARS-CoV-2, NAA: NOT DETECTED

## 2020-05-12 NOTE — Progress Notes (Signed)
Please let Derek Wright know his Covid was negative.  Great news!!!  If any worsening or ongoing symptoms, please return to office.  Have a wonderful day!!

## 2020-05-25 ENCOUNTER — Other Ambulatory Visit: Payer: Self-pay | Admitting: Nurse Practitioner

## 2020-05-25 NOTE — Telephone Encounter (Signed)
Requested Prescriptions  Pending Prescriptions Disp Refills  . metoprolol succinate (TOPROL-XL) 25 MG 24 hr tablet [Pharmacy Med Name: METOPROLOL SUCC ER 25 MG TAB] 30 tablet 0    Sig: TAKE ONE TABLET DAILY AS NEEDED.     Cardiovascular:  Beta Blockers Passed - 05/25/2020  9:45 AM      Passed - Last BP in normal range    BP Readings from Last 1 Encounters:  12/12/19 112/72         Passed - Last Heart Rate in normal range    Pulse Readings from Last 1 Encounters:  12/12/19 (!) 57         Passed - Valid encounter within last 6 months    Recent Outpatient Visits          2 weeks ago Sinus pressure   [place], [place] T, NP   5 months ago Atherosclerosis of aorta ([place])   [place] Cannady, Barbaraann Faster, NP   8 months ago Skin cyst   [place], Henrine Screws T, NP   10 months ago Atherosclerosis of aorta ([place])   [place], Barbaraann Faster, NP   1 year ago Essential hypertension   [place], Barbaraann Faster, NP      Future Appointments            In 3 weeks Cannady, Barbaraann Faster, NP [facility]   In 8 months  [facility]

## 2020-06-15 ENCOUNTER — Other Ambulatory Visit: Payer: Self-pay

## 2020-06-15 ENCOUNTER — Encounter: Payer: Self-pay | Admitting: Nurse Practitioner

## 2020-06-15 ENCOUNTER — Ambulatory Visit (INDEPENDENT_AMBULATORY_CARE_PROVIDER_SITE_OTHER): Payer: Medicare Other | Admitting: Nurse Practitioner

## 2020-06-15 VITALS — BP 124/72 | HR 80 | Temp 98.4°F | Wt 282.0 lb

## 2020-06-15 DIAGNOSIS — Z6835 Body mass index (BMI) 35.0-35.9, adult: Secondary | ICD-10-CM

## 2020-06-15 DIAGNOSIS — I700 Atherosclerosis of aorta: Secondary | ICD-10-CM

## 2020-06-15 DIAGNOSIS — E782 Mixed hyperlipidemia: Secondary | ICD-10-CM | POA: Diagnosis not present

## 2020-06-15 DIAGNOSIS — R7309 Other abnormal glucose: Secondary | ICD-10-CM

## 2020-06-15 DIAGNOSIS — Z1211 Encounter for screening for malignant neoplasm of colon: Secondary | ICD-10-CM | POA: Diagnosis not present

## 2020-06-15 DIAGNOSIS — E6609 Other obesity due to excess calories: Secondary | ICD-10-CM

## 2020-06-15 DIAGNOSIS — I10 Essential (primary) hypertension: Secondary | ICD-10-CM

## 2020-06-15 DIAGNOSIS — R12 Heartburn: Secondary | ICD-10-CM | POA: Insufficient documentation

## 2020-06-15 LAB — MICROALBUMIN, URINE WAIVED
Creatinine, Urine Waived: 200 mg/dL (ref 10–300)
Microalb, Ur Waived: 10 mg/L (ref 0–19)
Microalb/Creat Ratio: <30 mg/g (ref <30)

## 2020-06-15 NOTE — Assessment & Plan Note (Signed)
Chronic, stable with BP below goal on recheck today.  Continue current medication regimen + collaboration with cardiology and recommend checking BP daily at home and documenting + focus on DASH diet.  Labs today: CMP, urine ALB.  Return in 6 months.

## 2020-06-15 NOTE — Assessment & Plan Note (Signed)
Chronic, ongoing.  Continue daily statin and adjust dose as needed. Recommend he take a daily ASA 81 MG for prevention.  Return in 6 months.  Lipid panel today. ?

## 2020-06-15 NOTE — Patient Instructions (Signed)
Prediabetes Eating Plan Prediabetes is a condition that causes blood sugar (glucose) levels to be higher than normal. This increases the risk for developing type 2 diabetes (type 2 diabetes mellitus). Working with a health care provider or nutrition specialist (dietitian) to make diet and lifestyle changes can help prevent the onset of diabetes. These changes may help you:  Control your blood glucose levels.  Improve your cholesterol levels.  Manage your blood pressure. What are tips for following this plan? Reading food labels  Read food labels to check the amount of fat, salt (sodium), and sugar in prepackaged foods. Avoid foods that have: ? Saturated fats. ? Trans fats. ? Added sugars.  Avoid foods that have more than 300 milligrams (mg) of sodium per serving. Limit your sodium intake to less than 2,300 mg each day. Shopping  Avoid buying pre-made and processed foods.  Avoid buying drinks with added sugar. Cooking  Cook with olive oil. Do not use butter, lard, or ghee.  Bake, broil, grill, steam, or boil foods. Avoid frying. Meal planning  Work with your dietitian to create an eating plan that is right for you. This may include tracking how many calories you take in each day. Use a food diary, notebook, or mobile application to track what you eat at each meal.  Consider following a Mediterranean diet. This includes: ? Eating several servings of fresh fruits and vegetables each day. ? Eating fish at least twice a week. ? Eating one serving each day of whole grains, beans, nuts, and seeds. ? Using olive oil instead of other fats. ? Limiting alcohol. ? Limiting red meat. ? Using nonfat or low-fat dairy products.  Consider following a plant-based diet. This includes dietary choices that focus on eating mostly vegetables and fruit, grains, beans, nuts, and seeds.  If you have high blood pressure, you may need to limit your sodium intake or follow a diet such as the DASH  (Dietary Approaches to Stop Hypertension) eating plan. The DASH diet aims to lower high blood pressure.   Lifestyle  Set weight loss goals with help from your health care team. It is recommended that most people with prediabetes lose 7% of their body weight.  Exercise for at least 30 minutes 5 or more days a week.  Attend a support group or seek support from a mental health counselor.  Take over-the-counter and prescription medicines only as told by your health care provider. What foods are recommended? Fruits Berries. Bananas. Apples. Oranges. Grapes. Papaya. Mango. Pomegranate. Kiwi. Grapefruit. Cherries. Vegetables Lettuce. Spinach. Peas. Beets. Cauliflower. Cabbage. Broccoli. Carrots. Tomatoes. Squash. Eggplant. Herbs. Peppers. Onions. Cucumbers. Brussels sprouts. Grains Whole grains, such as whole-wheat or whole-grain breads, crackers, cereals, and pasta. Unsweetened oatmeal. Bulgur. Barley. Quinoa. Brown rice. Corn or whole-wheat flour tortillas or taco shells. Meats and other proteins Seafood. Poultry without skin. Lean cuts of pork and beef. Tofu. Eggs. Nuts. Beans. Dairy Low-fat or fat-free dairy products, such as yogurt, cottage cheese, and cheese. Beverages Water. Tea. Coffee. Sugar-free or diet soda. Seltzer water. Low-fat or nonfat milk. Milk alternatives, such as soy or almond milk. Fats and oils Olive oil. Canola oil. Sunflower oil. Grapeseed oil. Avocado. Walnuts. Sweets and desserts Sugar-free or low-fat pudding. Sugar-free or low-fat ice cream and other frozen treats. Seasonings and condiments Herbs. Sodium-free spices. Mustard. Relish. Low-salt, low-sugar ketchup. Low-salt, low-sugar barbecue sauce. Low-fat or fat-free mayonnaise. The items listed above may not be a complete list of recommended foods and beverages. Contact a dietitian for more   information. What foods are not recommended? Fruits Fruits canned with syrup. Vegetables Canned vegetables. Frozen  vegetables with butter or cream sauce. Grains Refined white flour and flour products, such as bread, pasta, snack foods, and cereals. Meats and other proteins Fatty cuts of meat. Poultry with skin. Breaded or fried meat. Processed meats. Dairy Full-fat yogurt, cheese, or milk. Beverages Sweetened drinks, such as iced tea and soda. Fats and oils Butter. Lard. Ghee. Sweets and desserts Baked goods, such as cake, cupcakes, pastries, cookies, and cheesecake. Seasonings and condiments Spice mixes with added salt. Ketchup. Barbecue sauce. Mayonnaise. The items listed above may not be a complete list of foods and beverages that are not recommended. Contact a dietitian for more information. Where to find more information  American Diabetes Association: www.diabetes.org Summary  You may need to make diet and lifestyle changes to help prevent the onset of diabetes. These changes can help you control blood sugar, improve cholesterol levels, and manage blood pressure.  Set weight loss goals with help from your health care team. It is recommended that most people with prediabetes lose 7% of their body weight.  Consider following a Mediterranean diet. This includes eating plenty of fresh fruits and vegetables, whole grains, beans, nuts, seeds, fish, and low-fat dairy, and using olive oil instead of other fats. This information is not intended to replace advice given to you by your health care provider. Make sure you discuss any questions you have with your health care provider. Document Revised: 05/01/2019 Document Reviewed: 05/01/2019 Elsevier Patient Education  2021 Elsevier Inc.  

## 2020-06-15 NOTE — Assessment & Plan Note (Signed)
Chronic, ongoing, improved with increase Prilosec.  Continue current medication regimen and adjust as needed. Mag level today.

## 2020-06-15 NOTE — Progress Notes (Signed)
BP 124/72 (BP Location: Left Arm, Patient Position: Sitting)   Pulse 80   Temp 98.4 F (36.9 C) (Oral)   Wt 282 lb (127.9 kg)   SpO2 98%   BMI 35.72 kg/m    Subjective:    [name], male    DOB: [birth year], 75 y.o.   MRN: [id number]  HPI: Derek Wright is a 75 y.o. male  Chief Complaint  Patient presents with  . Prediabetes  . Hyperlipidemia  . Hypertension   HYPERTENSION / HYPERLIPIDEMIA Continues on Metoprolol XL 25 MG daily as needed and Atorvastatin 10 MG daily. Saw cardiology 03/22/20 they added Prilosec 40 MG.  Feeling better with the increase in Prilosec, improved acid reflux.   Satisfied with current treatment? no Duration of hypertension: chronic BP monitoring frequency: not checking BP range:  BP medication side effects: no Duration of hyperlipidemia: chronic Cholesterol medication side effects: no Cholesterol supplements: none Medication compliance: good compliance Aspirin: no Recent stressors: no Recurrent headaches: no Visual changes: no Palpitations: no Dyspnea: no Chest pain: no Lower extremity edema: no Dizzy/lightheaded: no  The 10-year ASCVD risk score Mikey Bussing DC Jr., et al., 2013) is: 30.1%   Values used to calculate the score:     Age: 56 years     Sex: Male     Is Non-Hispanic African American: Yes     Diabetic: Yes     Tobacco smoker: No     Systolic Blood Pressure: 798 mmHg     Is BP treated: Yes     HDL Cholesterol: 56 mg/dL     Total Cholesterol: 150 mg/dL  PREDIABETES Last A1C 6.3% in October.  Is focused on diet. Polydipsia/polyuria: no Visual disturbance: no Chest pain: no Paresthesias: no  Relevant past medical, surgical, family and social history reviewed and updated as indicated. Interim medical history since our last visit reviewed. Allergies and medications reviewed and updated.  Review of Systems  Constitutional: Negative for activity change, diaphoresis, fatigue and fever.  Respiratory: Negative for  cough, chest tightness, shortness of breath and wheezing.   Cardiovascular: Negative for chest pain, palpitations and leg swelling.  Gastrointestinal: Negative.   Endocrine: Negative for polydipsia, polyphagia and polyuria.  Neurological: Negative.   Psychiatric/Behavioral: Negative.     Per HPI unless specifically indicated above     Objective:    BP 124/72 (BP Location: Left Arm, Patient Position: Sitting)   Pulse 80   Temp 98.4 F (36.9 C) (Oral)   Wt 282 lb (127.9 kg)   SpO2 98%   BMI 35.72 kg/m   Wt Readings from Last 3 Encounters:  06/15/20 282 lb (127.9 kg)  01/26/20 270 lb (122.5 kg)  12/12/19 284 lb (128.8 kg)    Physical Exam Vitals and nursing note reviewed.  Constitutional:      General: He is awake. He is not in acute distress.    Appearance: He is well-developed. He is obese. He is not ill-appearing.  HENT:     Head: Normocephalic and atraumatic.     Right Ear: Hearing normal. No drainage.     Left Ear: Hearing normal. No drainage.  Eyes:     General: Lids are normal.        Right eye: No discharge.        Left eye: No discharge.     Conjunctiva/sclera: Conjunctivae normal.     Pupils: Pupils are equal, round, and reactive to light.  Neck:     Thyroid: No thyromegaly.  Vascular: No carotid bruit.  Cardiovascular:     Rate and Rhythm: Normal rate and regular rhythm.     Heart sounds: Normal heart sounds, S1 normal and S2 normal. No murmur heard. No gallop.   Pulmonary:     Effort: Pulmonary effort is normal. No accessory muscle usage or respiratory distress.     Breath sounds: Normal breath sounds.  Abdominal:     General: Bowel sounds are normal.     Palpations: Abdomen is soft.  Musculoskeletal:        General: Normal range of motion.     Cervical back: Normal range of motion and neck supple.     Right lower leg: No edema.     Left lower leg: No edema.  Skin:    General: Skin is warm and dry.     Capillary Refill: Capillary refill takes  less than 2 seconds.  Neurological:     Mental Status: He is alert and oriented to person, place, and time.  Psychiatric:        Attention and Perception: Attention normal.        Mood and Affect: Mood normal.        Behavior: Behavior normal. Behavior is cooperative.        Thought Content: Thought content normal.    Results for orders placed or performed in visit on 05/11/20  Novel Coronavirus, NAA ([facility])   Specimen: Nasopharyngeal(NP) swabs in vial transport medium  Result Value Ref Range   SARS-CoV-2, NAA Not Detected Not Detected  SARS-COV-2, NAA 2 DAY TAT  Result Value Ref Range   SARS-CoV-2, NAA 2 DAY TAT Performed       Assessment & Plan:   Problem List Items Addressed This Visit      Cardiovascular and Mediastinum   HTN (hypertension)    Chronic, stable with BP below goal on recheck today.  Continue current medication regimen + collaboration with cardiology and recommend checking BP daily at home and documenting + focus on DASH diet.  Labs today: CMP, urine ALB.  Return in 6 months.      Relevant Orders   Comprehensive metabolic panel   Atherosclerosis of aorta ([place]) - Primary    Noted on CT 08/17/17, recommend continued use of statin daily and adding a daily Baby ASA 81 MG for prevention.  Recommend continued cessation of smoking, he quit in 1982.        Other   Elevated hemoglobin A1c    Check A1C today and continue diet focus.  If elevation consider initiation of medication.        Relevant Orders   Hemoglobin A1c   Microalbumin, Urine Waived   Hyperlipidemia    Chronic, ongoing.  Continue daily statin and adjust dose as needed. Recommend he take a daily ASA 81 MG for prevention.  Return in 6 months.  Lipid panel today.      Relevant Orders   Lipid Panel w/o Chol/HDL Ratio   Obesity    BMI 35.72. Recommended eating smaller high protein, low fat meals more frequently and exercising 30 mins a day 5 times a week with a goal of 10-15lb weight loss in the  next 3 months. Patient voiced their understanding and motivation to adhere to these recommendations.        Heart burn    Chronic, ongoing, improved with increase Prilosec.  Continue current medication regimen and adjust as needed. Mag level today.      Relevant Orders   Magnesium  Other Visit Diagnoses    Colon cancer screening       Referral to GI   Relevant Orders   Ambulatory referral to Gastroenterology       Follow up plan: Return in about 6 months (around 12/16/2020) for Annual physical.

## 2020-06-15 NOTE — Assessment & Plan Note (Signed)
Noted on CT 08/17/17, recommend continued use of statin daily and adding a daily Baby ASA 81 MG for prevention.  Recommend continued cessation of smoking, he quit in 1982.

## 2020-06-15 NOTE — Assessment & Plan Note (Signed)
Check A1C today and continue diet focus.  If elevation consider initiation of medication.   

## 2020-06-15 NOTE — Assessment & Plan Note (Signed)
BMI 35.72. Recommended eating smaller high protein, low fat meals more frequently and exercising 30 mins a day 5 times a week with a goal of 10-15lb weight loss in the next 3 months. Patient voiced their understanding and motivation to adhere to these recommendations.

## 2020-06-16 ENCOUNTER — Other Ambulatory Visit: Payer: Self-pay | Admitting: Nurse Practitioner

## 2020-06-16 LAB — COMPREHENSIVE METABOLIC PANEL
ALT: 12 IU/L (ref 0–44)
AST: 16 IU/L (ref 0–40)
Albumin/Globulin Ratio: 2.1 (ref 1.2–2.2)
Albumin: 4.4 g/dL (ref 3.7–4.7)
Alkaline Phosphatase: 70 IU/L (ref 44–121)
BUN/Creatinine Ratio: 13 (ref 10–24)
BUN: 15 mg/dL (ref 8–27)
Bilirubin Total: 0.6 mg/dL (ref 0.0–1.2)
CO2: 24 mmol/L (ref 20–29)
Calcium: 9.2 mg/dL (ref 8.6–10.2)
Chloride: 104 mmol/L (ref 96–106)
Creatinine, Ser: 1.13 mg/dL (ref 0.76–1.27)
Globulin, Total: 2.1 g/dL (ref 1.5–4.5)
Glucose: 98 mg/dL (ref 65–99)
Potassium: 4.1 mmol/L (ref 3.5–5.2)
Sodium: 142 mmol/L (ref 134–144)
Total Protein: 6.5 g/dL (ref 6.0–8.5)
eGFR: 69 mL/min/{1.73_m2} (ref >59)

## 2020-06-16 LAB — LIPID PANEL W/O CHOL/HDL RATIO
Cholesterol, Total: 166 mg/dL (ref 100–199)
HDL: 60 mg/dL (ref >39)
LDL Chol Calc (NIH): 88 mg/dL (ref 0–99)
Triglycerides: 98 mg/dL (ref 0–149)
VLDL Cholesterol Cal: 18 mg/dL (ref 5–40)

## 2020-06-16 LAB — HEMOGLOBIN A1C
Est. average glucose Bld gHb Est-mCnc: 137 mg/dL
Hgb A1c MFr Bld: 6.4 % — ABNORMAL HIGH (ref 4.8–5.6)

## 2020-06-16 LAB — MAGNESIUM: Magnesium: 1.9 mg/dL (ref 1.6–2.3)

## 2020-06-16 MED ORDER — ATORVASTATIN CALCIUM 40 MG PO TABS: 40.0000 mg | ORAL_TABLET | Freq: Every day | ORAL | 4 refills | Status: DC

## 2020-06-16 NOTE — Progress Notes (Signed)
Good morning, please let Derek Wright know his labs have returned.  Kidney and liver function + electrolytes are normal.  Cholesterol levels remain a little above goal, I would like to increase his Atorvastatin to 40 MG daily and will send this in to pharmacy.  Magnesium level is normal.  A1c, diabetes testing, is creeping up a little more.  Now at 6.4%, if this reaches 6.5% or greater we will need to start medication for diabetes.  Monitor diet at home closely and cut back on sugar and carbohydrates.  We will recheck next visit.  Any questions? Keep being awesome!!  Thank you for allowing me to participate in your care. Kindest regards, Faigy Stretch

## 2020-06-18 ENCOUNTER — Telehealth: Payer: Self-pay | Admitting: Nurse Practitioner

## 2020-06-18 NOTE — Telephone Encounter (Signed)
Please advise 

## 2020-06-18 NOTE — Telephone Encounter (Signed)
Spoke with patient and answered all questions he had and patient verbalized understanding and has no further questions.

## 2020-06-18 NOTE — Telephone Encounter (Signed)
Patient would like the nurse to call him regarding a colonoscopy appt.  He got a phone call and is confused on when his test is supposed to be.  Please call patient to confirm at [phone]

## 2020-06-23 ENCOUNTER — Other Ambulatory Visit: Payer: Self-pay | Admitting: Nurse Practitioner

## 2020-07-06 ENCOUNTER — Other Ambulatory Visit: Payer: Self-pay

## 2020-07-06 ENCOUNTER — Telehealth: Payer: Self-pay | Admitting: Gastroenterology

## 2020-07-06 ENCOUNTER — Telehealth (INDEPENDENT_AMBULATORY_CARE_PROVIDER_SITE_OTHER): Payer: Self-pay | Admitting: Gastroenterology

## 2020-07-06 DIAGNOSIS — Z86010 Personal history of colonic polyps: Secondary | ICD-10-CM

## 2020-07-06 DIAGNOSIS — Z1211 Encounter for screening for malignant neoplasm of colon: Secondary | ICD-10-CM

## 2020-07-06 MED ORDER — NA SULFATE-K SULFATE-MG SULF 17.5-3.13-1.6 GM/177ML PO SOLN: 354.0000 mL | Freq: Once | ORAL | 0 refills | Status: DC

## 2020-07-06 MED ORDER — PEG 3350-KCL-NA BICARB-NACL 420 G PO SOLR: ORAL | 0 refills | Status: DC

## 2020-07-06 NOTE — Progress Notes (Signed)
Gastroenterology Pre-Procedure Review  Request Date: 07/20/2020 Requesting Physician: Dr. Bonna Gains   PATIENT REVIEW QUESTIONS: The patient responded to the following health history questions as indicated:    1. Are you having any GI issues? no 2. Do you have a personal history of Polyps? Yes several years ago  3. Do you have a family history of Colon Cancer or Polyps?  No  4. Diabetes Mellitus? nO  5. Joint replacements in the past 12 months? No  6. Major health problems in the past 3 months? No  7. Any artificial heart valves, MVP, or defibrillator?No    MEDICATIONS & ALLERGIES:    Patient reports the following regarding taking any anticoagulation/antiplatelet therapy:   Plavix, Coumadin, Eliquis, Xarelto, Lovenox, Pradaxa, Brilinta, or Effient? no Aspirin? no  Patient confirms/reports the following medications:  Current Outpatient Medications  Medication Sig Dispense Refill  . atorvastatin (LIPITOR) 40 MG tablet Take 1 tablet (40 mg total) by mouth daily. 90 tablet 4  . furosemide (LASIX) 20 MG tablet Take by mouth.    . metoprolol succinate (TOPROL-XL) 25 MG 24 hr tablet TAKE ONE TABLET DAILY AS NEEDED. 90 tablet 0  . omeprazole (PRILOSEC) 40 MG capsule Take 40 mg by mouth daily.     No current facility-administered medications for this visit.    Patient confirms/reports the following allergies:  No Known Allergies  No orders of the defined types were placed in this encounter.   AUTHORIZATION INFORMATION Primary Insurance: 1D#: Group #:  Secondary Insurance: 1D#: Group #:  SCHEDULE INFORMATION: Date:  Time: Location:

## 2020-07-06 NOTE — Telephone Encounter (Signed)
Randal Buba has been sent to [facility] per pt request.

## 2020-07-06 NOTE — Telephone Encounter (Signed)
Patient stated that his prep medication cost was $117 at the Pharmacy and wants to see if the provider has other affordable options. Clinical staff will follow up with patient.

## 2020-07-19 ENCOUNTER — Telehealth: Payer: Self-pay

## 2020-07-19 NOTE — Telephone Encounter (Signed)
lmovm for pt to return call to see when he would like to rescheduled procedure

## 2020-07-19 NOTE — Telephone Encounter (Signed)
This pt went to the endoscopy desk wanting additional info for his procedure tomorrow. However, he told the nurse that he had eaten breakfast this morning. Do you want him to be rescheduled or can [facility] call him and remind him that he is needing to be in a clear liquid diety today or reschedule. Please advise Derek Wright. Thank you!    Dr Bonna Gains   when you call him if he confirms he had solids for breakfast instead of a clear liquid diet he should be rescheduled.

## 2020-07-20 ENCOUNTER — Ambulatory Visit: Payer: Medicare Other | Admitting: Anesthesiology

## 2020-07-20 ENCOUNTER — Encounter: Payer: Self-pay | Admitting: Gastroenterology

## 2020-07-20 ENCOUNTER — Ambulatory Visit
Admission: RE | Admit: 2020-07-20 | Discharge: 2020-07-20 | Disposition: A | Discharge status: Home / Self Care | Payer: Medicare Other | Attending: Gastroenterology | Admitting: Gastroenterology

## 2020-07-20 ENCOUNTER — Encounter
Admission: RE | Disposition: A | Discharge status: Home / Self Care | Payer: Self-pay | Source: Home / Self Care | Attending: Gastroenterology

## 2020-07-20 DIAGNOSIS — K648 Other hemorrhoids: Secondary | ICD-10-CM | POA: Diagnosis not present

## 2020-07-20 DIAGNOSIS — Z860100 Personal history of colon polyps, unspecified: Secondary | ICD-10-CM

## 2020-07-20 DIAGNOSIS — Z79899 Other long term (current) drug therapy: Secondary | ICD-10-CM | POA: Insufficient documentation

## 2020-07-20 DIAGNOSIS — Z1211 Encounter for screening for malignant neoplasm of colon: Secondary | ICD-10-CM | POA: Insufficient documentation

## 2020-07-20 DIAGNOSIS — E785 Hyperlipidemia, unspecified: Secondary | ICD-10-CM | POA: Diagnosis not present

## 2020-07-20 DIAGNOSIS — Z87891 Personal history of nicotine dependence: Secondary | ICD-10-CM | POA: Diagnosis not present

## 2020-07-20 DIAGNOSIS — K635 Polyp of colon: Secondary | ICD-10-CM

## 2020-07-20 HISTORY — PX: COLONOSCOPY WITH PROPOFOL: SHX5780

## 2020-07-20 SURGERY — COLONOSCOPY WITH PROPOFOL
Anesthesia: General

## 2020-07-20 MED ORDER — PROPOFOL 10 MG/ML IV BOLUS
INTRAVENOUS | Status: AC
  Filled 2020-07-20: qty 20

## 2020-07-20 MED ORDER — LIDOCAINE HCL (CARDIAC) PF 100 MG/5ML IV SOSY
PREFILLED_SYRINGE | INTRAVENOUS | Status: DC | PRN
  Administered 2020-07-20: 80 mg via INTRAVENOUS

## 2020-07-20 MED ORDER — PROPOFOL 10 MG/ML IV BOLUS
INTRAVENOUS | Status: DC | PRN
  Administered 2020-07-20: 50 mg via INTRAVENOUS

## 2020-07-20 MED ORDER — SODIUM CHLORIDE 0.9 % IV SOLN
INTRAVENOUS | Status: DC
  Administered 2020-07-20: 1000 mL via INTRAVENOUS

## 2020-07-20 MED ORDER — PROPOFOL 500 MG/50ML IV EMUL
INTRAVENOUS | Status: DC | PRN
  Administered 2020-07-20: 140 ug/kg/min via INTRAVENOUS

## 2020-07-20 NOTE — Transfer of Care (Signed)
Immediate Anesthesia Transfer of Care Note  Patient: Derek Wright  Procedure(s) Performed: COLONOSCOPY WITH PROPOFOL (N/A )  Patient Location: PACU  Anesthesia Type:General  Level of Consciousness: awake and alert   Airway & Oxygen Therapy: Patient Spontanous Breathing and Patient connected to nasal cannula oxygen  Post-op Assessment: Report given to RN and Post -op Vital signs reviewed and stable  Post vital signs: Reviewed and stable  Last Vitals:  Vitals Value Taken Time  BP 114/75 07/20/20 1253  Temp 35.9 C 07/20/20 1253  Pulse 75 07/20/20 1300  Resp 17 07/20/20 1300  SpO2 99 % 07/20/20 1300  Vitals shown include unvalidated device data.  Last Pain:  Vitals:   07/20/20 1253  TempSrc: Temporal  PainSc:          Complications: No complications documented.

## 2020-07-20 NOTE — Anesthesia Procedure Notes (Signed)
Date/Time: 07/20/2020 12:20 PM Performed by: Allean Found, CRNA Pre-anesthesia Checklist: Patient identified, Emergency Drugs available, Suction available, Patient being monitored and Timeout performed Oxygen Delivery Method: Nasal cannula Placement Confirmation: positive ETCO2

## 2020-07-20 NOTE — H&P (Addendum)
Derek Antigua, MD [address], [place], [place], [place], [address] [address], [place], [place], [place], [address] Phone: [phone]  Fax: [phone]  Primary Care Physician:  Venita Lick, NP   Pre-Procedure History & Physical: HPI:  Derek Wright is a 75 y.o. male is here for a colonoscopy.   Past Medical History:  Diagnosis Date  . Arthritis   . GERD (gastroesophageal reflux disease)    RARE-NO MEDS  . Hyperlipidemia   . Hypertension   . Pre-diabetes   . Sleep apnea    MILD-NO CPAP-COULD NOT TOELERATE    Past Surgical History:  Procedure Laterality Date  . COLONOSCOPY  5784  . hair folicle removed    . HERNIA REPAIR    . INGUINAL HERNIA REPAIR Bilateral 09/29/2015   Procedure: LAPAROSCOPIC BILATERAL INGUINAL HERNIA REPAIR;  Surgeon: Robert Bellow, MD;  Location: [facility] ORS;  Service: General;  Laterality: Bilateral;  . SKIN GRAFT Right 1966   hand    Prior to Admission medications   Medication Sig Start Date End Date Taking? Authorizing Provider  atorvastatin (LIPITOR) 40 MG tablet Take 1 tablet (40 mg total) by mouth daily. 06/16/20  Yes Cannady, Jolene T, NP  furosemide (LASIX) 20 MG tablet Take by mouth. 03/22/20 03/22/21 Yes [provider]  metoprolol succinate (TOPROL-XL) 25 MG 24 hr tablet TAKE ONE TABLET DAILY AS NEEDED. 06/23/20  Yes Cannady, Jolene T, NP  polyethylene glycol-electrolytes (GAVILYTE-N WITH FLAVOR PACK) 420 g solution Drink one 8 oz glass every 20 mins until entire container is finished starting at 5:00pm on 07/19/20 07/06/20  Yes Milli Woolridge, Margretta Sidle B, MD  omeprazole (PRILOSEC) 40 MG capsule Take 40 mg by mouth daily. Patient not taking: Reported on 07/20/2020 04/22/20   [provider]    Allergies as of 07/06/2020  . (No Known Allergies)    Family History  Problem Relation Age of Onset  . Ovarian cancer Mother   . Heart disease Father   . Lymphoma Maternal Uncle     Social History   Socioeconomic  History  . Marital status: Married    Spouse name: Not on file  . Number of children: Not on file  . Years of education: 61  . Highest education level: High school graduate  Occupational History  . Not on file  Tobacco Use  . Smoking status: Former Smoker    Packs/day: 0.25    Years: 15.00    Pack years: 3.75    Types: Cigarettes    Quit date: 09/13/1980    Years since quitting: 39.8  . Smokeless tobacco: Never Used  Vaping Use  . Vaping Use: Never used  Substance and Sexual Activity  . Alcohol use: Yes    Alcohol/week: 2.0 standard drinks    Types: 2 Cans of beer per week  . Drug use: No  . Sexual activity: Not on file  Other Topics Concern  . Not on file  Social History Narrative  . Not on file   Social Determinants of Health   Financial Resource Strain: Low Risk   . Difficulty of Paying Living Expenses: Not hard at all  Food Insecurity: No Food Insecurity  . Worried About Charity fundraiser in the Last Year: Never true  . Ran Out of Food in the Last Year: Never true  Transportation Needs: No Transportation Needs  . Lack of Transportation (Medical): No  . Lack of Transportation (Non-Medical): No  Physical Activity: Inactive  . Days of Exercise  per Week: 0 days  . Minutes of Exercise per Session: 0 min  Stress: No Stress Concern Present  . Feeling of Stress : Not at all  Social Connections: Not on file  Intimate Partner Violence: Not on file    Review of Systems: See HPI, otherwise negative ROS  Physical Exam: BP (!) 156/95   Pulse 62   Temp (!) 97.5 F (36.4 C)   Resp 18   Ht 6\' 3"  (1.905 m)   Wt 123.2 kg   SpO2 100%   BMI 33.94 kg/m  General:   Alert,  pleasant and cooperative in NAD Head:  Normocephalic and atraumatic. Neck:  Supple; no masses or thyromegaly. Lungs:  Clear throughout to auscultation, normal respiratory effort.    Heart:  +S1, +S2, Regular rate and rhythm, No edema. Abdomen:  Soft, nontender and nondistended. Normal bowel sounds,  without guarding, and without rebound.   Neurologic:  Alert and  oriented x4;  grossly normal neurologically.  Impression/Plan: Derek Wright is here for a colonoscopy to be performed for history of polyps. The referral for his procedure was placed for history of polyps. I am able to find procedure notes in care everywhere that show a 2012 colonoscopy by Dr. Phylis Bougie that was done for indication "Screening" and it was reported to be normal and repeat was recommended in 5 yrs.  Prior to the above there is a 2002 colonoscopy report done to hemoccult positive stool and history of polyps. This showed a 24mm nodule in the descending colon that was biopsied. Hemorrhoids and diverticulosis were reported. Patholgy report not available. Any colonoscopies prior to this not available  Risks, benefits, limitations, and alternatives regarding  colonoscopy have been reviewed with the patient.  Questions have been answered.  All parties agreeable.   Virgel Manifold, MD  07/20/2020, 11:15 AM

## 2020-07-20 NOTE — Anesthesia Preprocedure Evaluation (Signed)
Anesthesia Evaluation  Patient identified by MRN, date of birth, ID band Patient awake    Reviewed: Allergy & Precautions, H&P , NPO status , Patient's Chart, lab work & pertinent test results, reviewed documented beta blocker date and time   History of Anesthesia Complications Negative for: history of anesthetic complications  Airway Mallampati: III  TM Distance: >3 FB Neck ROM: full    Dental  (+) Poor Dentition   Pulmonary sleep apnea , neg COPD, Patient abstained from smoking.Not current smoker, former smoker,    Pulmonary exam normal        Cardiovascular Exercise Tolerance: Good METShypertension, (-) CAD and (-) Past MI Normal cardiovascular exam(-) dysrhythmias  Rhythm:regular Rate:Normal     Neuro/Psych negative psych ROS   GI/Hepatic Neg liver ROS, GERD  Medicated,  Endo/Other  negative endocrine ROSneg diabetes  Renal/GU negative Renal ROS  negative genitourinary   Musculoskeletal   Abdominal   Peds  Hematology negative hematology ROS (+)   Anesthesia Other Findings Past Medical History: No date: Arthritis No date: GERD (gastroesophageal reflux disease)     Comment: RARE-NO MEDS No date: Pre-diabetes No date: Sleep apnea     Comment: MILD-NO CPAP-COULD NOT TOELERATE Past Surgical History: 2012: COLONOSCOPY No date: hair folicle removed 1194: SKIN GRAFT Right     Comment: hand   Reproductive/Obstetrics negative OB ROS                             Anesthesia Physical  Anesthesia Plan  ASA: III  Anesthesia Plan: General   Post-op Pain Management:    Induction: Intravenous  PONV Risk Score and Plan: 2 and Ondansetron, Propofol infusion and TIVA  Airway Management Planned: Nasal Cannula  Additional Equipment: None  Intra-op Plan:   Post-operative Plan:   Informed Consent: I have reviewed the patients History and Physical, chart, labs and discussed the  procedure including the risks, benefits and alternatives for the proposed anesthesia with the patient or authorized representative who has indicated his/her understanding and acceptance.     Dental Advisory Given  Plan Discussed with: CRNA  Anesthesia Plan Comments: (Discussed risks of anesthesia with patient, including possibility of difficulty with spontaneous ventilation under anesthesia necessitating airway intervention, PONV, and rare risks such as cardiac or respiratory or neurological events. Patient understands.)        Anesthesia Quick Evaluation

## 2020-07-20 NOTE — Telephone Encounter (Signed)
Pt had procedure done today. 

## 2020-07-20 NOTE — Op Note (Signed)
[facility] Patient Name: Kollin Udell Procedure Date: 07/20/2020 12:14 PM MRN: [id number] Account #: [id number] Date of Birth: 02/17/1945 Admit Type: Outpatient Age: 75 Room: [facility] ENDO ROOM 3 Gender: Male Note Status: Finalized Procedure:             Colonoscopy Indications:           High risk colon cancer surveillance: Personal history                         of colonic polyps Providers:             Shamekia Tippets B. Bonna Gains MD, MD Referring MD:          Forest Gleason Md, MD (Referring MD) Medicines:             Monitored Anesthesia Care Complications:         No immediate complications. Procedure:             Pre-Anesthesia Assessment:                        - ASA Grade Assessment: II - A patient with mild                         systemic disease.                        - Prior to the procedure, a History and Physical was                         performed, and patient medications, allergies and                         sensitivities were reviewed. The patient's tolerance                         of previous anesthesia was reviewed.                        - The risks and benefits of the procedure and the                         sedation options and risks were discussed with the                         patient. All questions were answered and informed                         consent was obtained.                        - Patient identification and proposed procedure were                         verified prior to the procedure by the physician, the                         nurse, the anesthesiologist, the anesthetist and the                         technician. The procedure  was verified in the                         procedure room.                        After obtaining informed consent, the colonoscope was                         passed under direct vision. Throughout the procedure,                         the patient's blood pressure, pulse, and oxygen                          saturations were monitored continuously. The                         Colonoscope was introduced through the anus and                         advanced to the the cecum, identified by appendiceal                         orifice and ileocecal valve. The colonoscopy was                         performed with ease. The patient tolerated the                         procedure well. The quality of the bowel preparation                         was poor. Findings:      The perianal and digital rectal examinations were normal.      A 4 mm polyp was found in the cecum. The polyp was sessile. The polyp       was removed with a jumbo cold forceps. Resection and retrieval were       complete.      The ascending colon revealed excessive looping. Advancing the scope       required changing the patient to a supine position and using manual       pressure.      The exam was otherwise without abnormality.      The rectum, sigmoid colon, descending colon, transverse colon, ascending       colon and cecum appeared normal.      Non-bleeding internal hemorrhoids were found during retroflexion. Impression:            - Preparation of the colon was poor.                        - One 4 mm polyp in the cecum, removed with a jumbo                         cold forceps. Resected and retrieved.                        - There was significant looping of the colon.                        -  The examination was otherwise normal.                        - The rectum, sigmoid colon, descending colon,                         transverse colon, ascending colon and cecum are normal.                        - Non-bleeding internal hemorrhoids. Recommendation:        - Await pathology results.                        - Repeat colonoscopy 3-6 months, with 2 day prep,                         because the bowel preparation was suboptimal.                        - Discharge patient to home (with escort).                         - Advance diet as tolerated.                        - Continue present medications.                        - Await pathology results.                        - Repeat colonoscopy date to be determined after                         pending pathology results are reviewed.                        - The findings and recommendations were discussed with                         the patient.                        - The findings and recommendations were discussed with                         the patient's family.                        - Return to primary care physician as previously                         scheduled.                        - High fiber diet. Procedure Code(s):     --- Professional ---                        [phone], Colonoscopy, flexible; with biopsy, single or  multiple Diagnosis Code(s):     --- Professional ---                        Z86.010, Personal history of colonic polyps                        K63.5, Polyp of colon CPT copyright 2019 American Medical Association. All rights reserved. The codes documented in this report are preliminary and upon coder review may  be revised to meet current compliance requirements.  Vonda Antigua, MD Margretta Sidle B. Bonna Gains MD, MD 07/20/2020 12:52:06 PM This report has been signed electronically. Number of Addenda: 0 Note Initiated On: 07/20/2020 12:14 PM Scope Withdrawal Time: 0 hours 10 minutes 33 seconds  Total Procedure Duration: 0 hours 25 minutes 2 seconds  Estimated Blood Loss:  Estimated blood loss: none.      [facility]

## 2020-07-21 ENCOUNTER — Encounter: Payer: Self-pay | Admitting: Gastroenterology

## 2020-07-21 NOTE — Anesthesia Postprocedure Evaluation (Signed)
Anesthesia Post Note  Patient: [name]  Procedure(s) Performed: COLONOSCOPY WITH PROPOFOL (N/A )  Patient location during evaluation: PACU Anesthesia Type: General Level of consciousness: awake and alert Pain management: pain level controlled Vital Signs Assessment: post-procedure vital signs reviewed and stable Respiratory status: spontaneous breathing, nonlabored ventilation, respiratory function stable and patient connected to nasal cannula oxygen Cardiovascular status: blood pressure returned to baseline and stable Postop Assessment: no apparent nausea or vomiting Anesthetic complications: no   No complications documented.   Last Vitals:  Vitals:   07/20/20 1313 07/20/20 1323  BP: 137/81 (!) 176/76  Pulse: 83   Resp:    Temp:    SpO2:      Last Pain:  Vitals:   07/21/20 0741  TempSrc:   PainSc: 0-No pain                 Molli Barrows

## 2020-07-22 ENCOUNTER — Encounter: Payer: Self-pay | Admitting: Gastroenterology

## 2020-07-22 LAB — SURGICAL PATHOLOGY

## 2020-09-08 ENCOUNTER — Other Ambulatory Visit: Payer: Self-pay | Admitting: Nurse Practitioner

## 2020-09-15 ENCOUNTER — Ambulatory Visit (INDEPENDENT_AMBULATORY_CARE_PROVIDER_SITE_OTHER): Payer: Medicare Other | Admitting: Nurse Practitioner

## 2020-09-15 ENCOUNTER — Encounter: Payer: Self-pay | Admitting: Nurse Practitioner

## 2020-09-15 ENCOUNTER — Other Ambulatory Visit: Payer: Self-pay

## 2020-09-15 VITALS — BP 148/94 | HR 75 | Temp 98.8°F | Wt 280.4 lb

## 2020-09-15 DIAGNOSIS — M62838 Other muscle spasm: Secondary | ICD-10-CM

## 2020-09-15 MED ORDER — METHYLPREDNISOLONE 4 MG PO TBPK: ORAL_TABLET | ORAL | 0 refills | Status: DC

## 2020-09-15 MED ORDER — CYCLOBENZAPRINE HCL 5 MG PO TABS: 5.0000 mg | ORAL_TABLET | Freq: Three times a day (TID) | ORAL | 1 refills | Status: DC | PRN

## 2020-09-15 NOTE — Progress Notes (Signed)
BP (!) 148/94   Pulse 75   Temp 98.8 F (37.1 C) (Oral)   Wt 280 lb 6.4 oz (127.2 kg)   SpO2 99%   BMI 35.05 kg/m    Subjective:    [name], male    DOB: [birth year], 75 y.o.   MRN: [id number]  HPI: Derek Wright is a 75 y.o. male  Chief Complaint  Patient presents with   Neck Pain    Patient states he woke up one morning and his neck was hurting and it has been dealing with for about 2 months. Patient states he only notices in the morning and does know if it is his pillows. Patient states some mornings are better than others. Patient states he tries OTC topical creams and Tylenol Arthritis and BC powders.     NECK PAIN FOLLOW UP Diagnosis:  Status: uncontrolled Treatments attempted:  Tylneol and BC Powders   Compliant with recommended treatment: yes Relief with NSAIDs?:  No NSAIDs Taken Location:Right and Left Duration:months Severity: moderate Quality:  tightness Frequency: intermittent Radiation: none Aggravating factors:  turning head Alleviating factors:  tylenol Weakness:  no Paresthesias / decreased sensation:  no  Fevers:  no   Relevant past medical, surgical, family and social history reviewed and updated as indicated. Interim medical history since our last visit reviewed. Allergies and medications reviewed and updated.  Review of Systems  Musculoskeletal:  Positive for neck pain.   Per HPI unless specifically indicated above     Objective:    BP (!) 148/94   Pulse 75   Temp 98.8 F (37.1 C) (Oral)   Wt 280 lb 6.4 oz (127.2 kg)   SpO2 99%   BMI 35.05 kg/m   Wt Readings from Last 3 Encounters:  09/15/20 280 lb 6.4 oz (127.2 kg)  07/20/20 271 lb 9 oz (123.2 kg)  06/15/20 282 lb (127.9 kg)    Physical Exam Vitals and nursing note reviewed.  Constitutional:      General: He is not in acute distress.    Appearance: Normal appearance. He is not ill-appearing, toxic-appearing or diaphoretic.  HENT:     Head: Normocephalic.      Right Ear: External ear normal.     Left Ear: External ear normal.     Nose: Nose normal. No congestion or rhinorrhea.     Mouth/Throat:     Mouth: Mucous membranes are moist.  Eyes:     General:        Right eye: No discharge.        Left eye: No discharge.     Extraocular Movements: Extraocular movements intact.     Conjunctiva/sclera: Conjunctivae normal.     Pupils: Pupils are equal, round, and reactive to light.  Neck:   Cardiovascular:     Rate and Rhythm: Normal rate and regular rhythm.     Heart sounds: No murmur heard. Pulmonary:     Effort: Pulmonary effort is normal. No respiratory distress.     Breath sounds: Normal breath sounds. No wheezing, rhonchi or rales.  Abdominal:     General: Abdomen is flat. Bowel sounds are normal.  Musculoskeletal:     Cervical back: Neck supple. Pain with movement present. Decreased range of motion.  Skin:    General: Skin is warm and dry.     Capillary Refill: Capillary refill takes less than 2 seconds.  Neurological:     General: No focal deficit present.     Mental  Status: He is alert and oriented to person, place, and time.  Psychiatric:        Mood and Affect: Mood normal.        Behavior: Behavior normal.        Thought Content: Thought content normal.        Judgment: Judgment normal.    Results for orders placed or performed during the hospital encounter of 07/20/20  Surgical pathology  Result Value Ref Range   SURGICAL PATHOLOGY      SURGICAL PATHOLOGY CASE: [phone] PATIENT: Montez Suber Surgical Pathology Report     Specimen Submitted: A. Colon polyp, cecum; cbx  Clinical History: History of colon polyp Z86.010.  Colon polyp      DIAGNOSIS: A. COLON POLYP, CECUM; COLD BIOPSY: - COLONIC MUCOSA WITH NO SIGNIFICANT PATHOLOGIC ALTERATION. - NEGATIVE FOR DYSPLASIA AND MALIGNANCY.  Comment: Multiple deeper levels are examined.   GROSS DESCRIPTION: A. Labeled: cbx polyp cecum Received:  Formalin Collection time: 12:36 PM on 07/20/2020 Placed into formalin time: 12:36 PM on 07/20/2020 Tissue fragment(s): Multiple Size: Aggregate, 0.6 x 0.6 x 0.1 cm Description: Received are at least 3 fragments of tan soft tissue admixed with intestinal debris.  The ratio of soft tissue to intestinal debris is 80:20. Entirely submitted in 1 cassette.  RB 07/20/2020  Final Diagnosis performed by Betsy Pries, MD.   Electronically signed 07/22/2020 8:36:38AM The electronic signature indicates that the  named Attending Pathologist has evaluated the specimen Technical component performed at [facility], [address] [place], [place] [address] Lab: [phone] Dir: Rush Farmer, MD, MMM  Professional component performed at [facility], [facility], [place], [place], [place] [address] Lab: [phone] Dir: Dellia Nims. Reuel Derby, MD       Assessment & Plan:   Problem List Items Addressed This Visit   None Visit Diagnoses     Muscle spasm    -  Primary   Given flexeril to help with muscle spasm. Discussed proper use of medication. Medrol dose pack given for inflammation. Recommend changing pillows to help.        Follow up plan: Return if symptoms worsen or fail to improve.

## 2020-10-13 DIAGNOSIS — G4733 Obstructive sleep apnea (adult) (pediatric): Secondary | ICD-10-CM | POA: Diagnosis not present

## 2020-10-13 DIAGNOSIS — I700 Atherosclerosis of aorta: Secondary | ICD-10-CM | POA: Diagnosis not present

## 2020-10-13 DIAGNOSIS — R600 Localized edema: Secondary | ICD-10-CM | POA: Diagnosis not present

## 2020-10-13 DIAGNOSIS — K219 Gastro-esophageal reflux disease without esophagitis: Secondary | ICD-10-CM | POA: Diagnosis not present

## 2020-10-13 DIAGNOSIS — E782 Mixed hyperlipidemia: Secondary | ICD-10-CM | POA: Diagnosis not present

## 2020-10-13 DIAGNOSIS — E119 Type 2 diabetes mellitus without complications: Secondary | ICD-10-CM | POA: Diagnosis not present

## 2020-10-13 DIAGNOSIS — I10 Essential (primary) hypertension: Secondary | ICD-10-CM | POA: Diagnosis not present

## 2020-10-15 ENCOUNTER — Emergency Department: Payer: Medicare Other

## 2020-10-15 ENCOUNTER — Other Ambulatory Visit: Payer: Self-pay

## 2020-10-15 ENCOUNTER — Emergency Department
Admission: EM | Admit: 2020-10-15 | Discharge: 2020-10-15 | Disposition: A | Discharge status: Home / Self Care | Payer: Medicare Other | Attending: Emergency Medicine | Admitting: Emergency Medicine

## 2020-10-15 DIAGNOSIS — W010XXA Fall on same level from slipping, tripping and stumbling without subsequent striking against object, initial encounter: Secondary | ICD-10-CM | POA: Insufficient documentation

## 2020-10-15 DIAGNOSIS — S40011A Contusion of right shoulder, initial encounter: Secondary | ICD-10-CM | POA: Insufficient documentation

## 2020-10-15 DIAGNOSIS — S4991XA Unspecified injury of right shoulder and upper arm, initial encounter: Secondary | ICD-10-CM | POA: Diagnosis not present

## 2020-10-15 DIAGNOSIS — Z87891 Personal history of nicotine dependence: Secondary | ICD-10-CM | POA: Insufficient documentation

## 2020-10-15 DIAGNOSIS — I10 Essential (primary) hypertension: Secondary | ICD-10-CM | POA: Insufficient documentation

## 2020-10-15 DIAGNOSIS — Z86010 Personal history of colonic polyps: Secondary | ICD-10-CM | POA: Insufficient documentation

## 2020-10-15 MED ORDER — MELOXICAM 7.5 MG PO TABS: 7.5000 mg | ORAL_TABLET | Freq: Every day | ORAL | 0 refills | Status: DC

## 2020-10-15 MED ORDER — MELOXICAM 7.5 MG PO TABS
7.5000 mg | ORAL_TABLET | Freq: Once | ORAL | Status: AC
  Administered 2020-10-15: 7.5 mg via ORAL
  Filled 2020-10-15: qty 1

## 2020-10-15 NOTE — ED Triage Notes (Signed)
Pt here with a fall today. Pt states that he tripped over a water hose and injured his right shoulder. Pt denies hitting his head.

## 2020-10-15 NOTE — ED Provider Notes (Signed)
[facility] Emergency Department Provider Note  ____________________________________________  Time seen: Approximately 3:54 PM  I have reviewed the triage vital signs and the nursing notes.   HISTORY  Chief Complaint Fall    HPI Derek Wright is a 75 y.o. male who presents the emergency department complaining of right shoulder pain after a mechanical fall.  Patient states that he tripped over hose, fell landed directly on her shoulder.  Patient states that his arm was next to his side, he did not hit his head or lose consciousness.  He denies any complaints to include headache, neck pain, rib pain, arm pain.  He states that he has had no previous injuries to the right shoulder.  States that he still able to move it, but he does have decreased range of motion as he gets close to 90 degrees of flexion, extension or abduction.  No numbness or tingling in the hand.  No medications prior to arrival.  Medical history includes arthritis, GERD, hypertension, sleep apnea.       Past Medical History:  Diagnosis Date   Arthritis    GERD (gastroesophageal reflux disease)    RARE-NO MEDS   Hyperlipidemia    Hypertension    Pre-diabetes    Sleep apnea    MILD-NO CPAP-COULD NOT TOELERATE    Patient Active Problem List   Diagnosis Date Noted   Personal history of colonic polyps    Polyp of colon    Heart burn 06/15/2020   Atherosclerosis of aorta ([place]) 06/27/2019   Obesity 05/13/2019   Hyperlipidemia 11/10/2018   Advanced care planning/counseling discussion 09/13/2016   Tremor, essential 09/13/2016   Right inguinal hernia 09/14/2015   Hernia, inguinal, left 07/14/2015   Arthralgia 08/12/2014   Elevated hemoglobin A1c 08/12/2014   BPH without obstruction/lower urinary tract symptoms 08/12/2014   HTN (hypertension) 07/22/2013    Past Surgical History:  Procedure Laterality Date   COLONOSCOPY  2012   COLONOSCOPY WITH PROPOFOL N/A 07/20/2020   Procedure:  COLONOSCOPY WITH PROPOFOL;  Surgeon: Virgel Manifold, MD;  Location: [facility] ENDOSCOPY;  Service: Endoscopy;  Laterality: N/A;   hair folicle removed     HERNIA REPAIR     INGUINAL HERNIA REPAIR Bilateral 09/29/2015   Procedure: LAPAROSCOPIC BILATERAL INGUINAL HERNIA REPAIR;  Surgeon: Robert Bellow, MD;  Location: [facility] ORS;  Service: General;  Laterality: Bilateral;   SKIN GRAFT Right 1966   hand    Prior to Admission medications   Medication Sig Start Date End Date Taking? Authorizing Provider  atorvastatin (LIPITOR) 40 MG tablet Take 1 tablet (40 mg total) by mouth daily. 06/16/20   Cannady, Henrine Screws T, NP  cyclobenzaprine (FLEXERIL) 5 MG tablet Take 1 tablet (5 mg total) by mouth 3 (three) times daily as needed for muscle spasms. 09/15/20   Jon Billings, NP  FLOWFLEX COVID-19 AG HOME TEST KIT See admin instructions. 07/07/20   [provider]  furosemide (LASIX) 20 MG tablet Take by mouth. 03/22/20 03/22/21  [provider]  meloxicam (MOBIC) 7.5 MG tablet Take 1 tablet (7.5 mg total) by mouth daily. 10/15/20 10/15/21  Ruhi Kopke, Charline Bills, PA-C  methylPREDNISolone (MEDROL DOSEPAK) 4 MG TBPK tablet Take as directed 09/15/20   Jon Billings, NP  metoprolol succinate (TOPROL-XL) 25 MG 24 hr tablet TAKE ONE TABLET DAILY AS NEEDED. 09/08/20   Marnee Guarneri T, NP  MODERNA COVID-19 VACCINE 100 MCG/0.5ML injection  05/24/20   [provider]  omeprazole (PRILOSEC) 40 MG capsule Take 40  mg by mouth daily. Patient not taking: No sig reported 04/22/20   [provider]  polyethylene glycol-electrolytes (GAVILYTE-N WITH FLAVOR PACK) 420 g solution Drink one 8 oz glass every 20 mins until entire container is finished starting at 5:00pm on 07/19/20 07/06/20   Virgel Manifold, MD    Allergies Patient has no known allergies.  Family History  Problem Relation Age of Onset   Ovarian cancer Mother    Heart disease Father    Lymphoma Maternal Uncle     Social  History Social History   Tobacco Use   Smoking status: Former    Packs/day: 0.25    Years: 15.00    Pack years: 3.75    Types: Cigarettes    Quit date: 09/13/1980    Years since quitting: 40.1   Smokeless tobacco: Never  Vaping Use   Vaping Use: Never used  Substance Use Topics   Alcohol use: Yes    Alcohol/week: 2.0 standard drinks    Types: 2 Cans of beer per week   Drug use: No     Review of Systems  Constitutional: No fever/chills Eyes: No visual changes. No discharge ENT: No upper respiratory complaints. Cardiovascular: no chest pain. Respiratory: no cough. No SOB. Gastrointestinal: No abdominal pain.  No nausea, no vomiting.  No diarrhea.  No constipation. Musculoskeletal: Positive for shoulder pain following mechanical fall. Skin: Negative for rash, abrasions, lacerations, ecchymosis. Neurological: Negative for headaches, focal weakness or numbness.  10 System ROS otherwise negative.  ____________________________________________   PHYSICAL EXAM:  VITAL SIGNS: ED Triage Vitals  Enc Vitals Group     BP 10/15/20 1511 132/88     Pulse Rate 10/15/20 1509 91     Resp 10/15/20 1509 16     Temp 10/15/20 1509 98.4 F (36.9 C)     Temp Source 10/15/20 1509 Oral     SpO2 10/15/20 1509 97 %     Weight 10/15/20 1510 274 lb (124.3 kg)     Height 10/15/20 1510 '6\' 3"'  (1.905 m)     Head Circumference --      Peak Flow --      Pain Score 10/15/20 1510 5     Pain Loc --      Pain Edu? --      Excl. in [place]? --      Constitutional: Alert and oriented. Well appearing and in no acute distress. Eyes: Conjunctivae are normal. PERRL. EOMI. Head: Atraumatic. ENT:      Ears:       Nose: No congestion/rhinnorhea.      Mouth/Throat: Mucous membranes are moist.  Neck: No stridor.  No cervical spine tenderness to palpation  Cardiovascular: Normal rate, regular rhythm. Normal S1 and S2.  Good peripheral circulation. Respiratory: Normal respiratory effort without tachypnea or  retractions. Lungs CTAB. Good air entry to the bases with no decreased or absent breath sounds. Musculoskeletal: Full range of motion to all extremities. No gross deformities appreciated.  Visualization of the right shoulder reveals no obvious signs of trauma with abrasions, lacerations, ecchymosis.  Patient is tender over the [facility] joint without significant other tenderness.  No palpable abnormality.  Lamination of the humerus, elbow, forearm is unremarkable.  Radial pulses sensation intact distally. Neurologic:  Normal speech and language. No gross focal neurologic deficits are appreciated.  Skin:  Skin is warm, dry and intact. No rash noted. Psychiatric: Mood and affect are normal. Speech and behavior are normal. Patient exhibits appropriate insight and judgement.  ____________________________________________   LABS (all labs ordered are listed, but only abnormal results are displayed)  Labs Reviewed - No data to display ____________________________________________  EKG   ____________________________________________  RADIOLOGY I personally viewed and evaluated these images as part of my medical decision making, as well as reviewing the written report by the radiologist.  ED Provider Interpretation: No acute traumatic findings.  Findings consistent with osteoarthritis.  DG Shoulder Right  Result Date: 10/15/2020 CLINICAL DATA:  Injury EXAM: RIGHT SHOULDER - 2+ VIEW COMPARISON:  08/17/2017 FINDINGS: There is no evidence of fracture or dislocation. Mild acromioclavicular arthrosis. The glenohumeral joint space is preserved. Soft tissues are unremarkable. IMPRESSION: 1.  No fracture or dislocation of the right shoulder. 2.  Mild acromioclavicular arthrosis. Electronically Signed   By: Eddie Candle M.D.   On: 10/15/2020 15:59    ____________________________________________    PROCEDURES  Procedure(s) performed:    Procedures    Medications  meloxicam (MOBIC) tablet 7.5 mg (has  no administration in time range)     ____________________________________________   INITIAL IMPRESSION / ASSESSMENT AND PLAN / ED COURSE  Pertinent labs & imaging results that were available during my care of the patient were reviewed by me and considered in my medical decision making (see chart for details).  Review of the [place] CSRS was performed in accordance of the [place] prior to dispensing any controlled drugs.           Patient's diagnosis is consistent with shoulder contusion.  Patient presented to the emergency department after mechanical fall landing on his shoulder.  Patient was having pain roughly over the [facility] joint.  No evidence of separation on x-ray.  Patient does have somewhat decreased range of motion due to pain but is able to perform complete range of motion with coaxing.  No evidence of fracture or subluxation.  At this time patient will be treated conservatively with anti-inflammatory.  Follow-up precautions discussed with the patient..  Follow-up primary care or orthopedics as needed.. Patient is given ED precautions to return to the ED for any worsening or new symptoms.     ____________________________________________  FINAL CLINICAL IMPRESSION(S) / ED DIAGNOSES  Final diagnoses:  Contusion of right shoulder, initial encounter      NEW MEDICATIONS STARTED DURING THIS VISIT:  ED Discharge Orders          Ordered    meloxicam (MOBIC) 7.5 MG tablet  Daily,   Status:  Discontinued        10/15/20 1641    meloxicam (MOBIC) 7.5 MG tablet  Daily        10/15/20 1642                This chart was dictated using voice recognition software/Dragon. Despite best efforts to proofread, errors can occur which can change the meaning. Any change was purely unintentional.    Darletta Moll, PA-C 10/15/20 1642    Harvest Dark, MD 10/15/20 2239

## 2020-12-10 DIAGNOSIS — H40003 Preglaucoma, unspecified, bilateral: Secondary | ICD-10-CM | POA: Diagnosis not present

## 2020-12-10 LAB — HM DIABETES EYE EXAM

## 2020-12-17 ENCOUNTER — Encounter: Payer: Self-pay | Admitting: Nurse Practitioner

## 2020-12-20 ENCOUNTER — Other Ambulatory Visit: Payer: Self-pay

## 2020-12-20 ENCOUNTER — Ambulatory Visit (INDEPENDENT_AMBULATORY_CARE_PROVIDER_SITE_OTHER): Payer: Medicare Other | Admitting: Nurse Practitioner

## 2020-12-20 ENCOUNTER — Encounter: Payer: Self-pay | Admitting: Nurse Practitioner

## 2020-12-20 DIAGNOSIS — N400 Enlarged prostate without lower urinary tract symptoms: Secondary | ICD-10-CM

## 2020-12-20 DIAGNOSIS — R7309 Other abnormal glucose: Secondary | ICD-10-CM | POA: Diagnosis not present

## 2020-12-20 DIAGNOSIS — I700 Atherosclerosis of aorta: Secondary | ICD-10-CM

## 2020-12-20 DIAGNOSIS — R12 Heartburn: Secondary | ICD-10-CM

## 2020-12-20 DIAGNOSIS — Z0000 Encounter for general adult medical examination without abnormal findings: Secondary | ICD-10-CM | POA: Diagnosis not present

## 2020-12-20 DIAGNOSIS — E782 Mixed hyperlipidemia: Secondary | ICD-10-CM

## 2020-12-20 DIAGNOSIS — I10 Essential (primary) hypertension: Secondary | ICD-10-CM | POA: Diagnosis not present

## 2020-12-20 LAB — MICROALBUMIN, URINE WAIVED
Creatinine, Urine Waived: 200 mg/dL (ref 10–300)
Microalb, Ur Waived: 30 mg/L — ABNORMAL HIGH (ref 0–19)
Microalb/Creat Ratio: <30 mg/g (ref <30)

## 2020-12-20 LAB — BAYER DCA HB A1C WAIVED: HB A1C (BAYER DCA - WAIVED): 6 % — ABNORMAL HIGH (ref 4.8–5.6)

## 2020-12-20 MED ORDER — METOPROLOL SUCCINATE ER 25 MG PO TB24: ORAL_TABLET | ORAL | 4 refills | Status: DC

## 2020-12-20 MED ORDER — OMEPRAZOLE 40 MG PO CPDR: 40.0000 mg | DELAYED_RELEASE_CAPSULE | Freq: Every day | ORAL | 4 refills | Status: DC

## 2020-12-20 NOTE — Assessment & Plan Note (Signed)
Chronic, stable with BP below goal on recheck today.  Continue current medication regimen + collaboration with cardiology and recommend checking BP daily at home and documenting + focus on DASH diet.  Labs today: CMP, TSH, CBC, urine ALB.  Urine ALB 30 today, could consider adding ARB (no ACE, previous smoker) in future for BP and proteinuria.  Return in 6 months.

## 2020-12-20 NOTE — Assessment & Plan Note (Signed)
Noted on CT 08/17/17, recommend continued use of statin daily and adding a daily Baby ASA 81 MG for prevention.  Recommend continued cessation of smoking, he quit in 1982.

## 2020-12-20 NOTE — Progress Notes (Signed)
BP 135/64   Pulse 74   Temp 99.1 F (37.3 C) (Oral)   Ht 6' 2" (1.88 m)   Wt 276 lb 12.8 oz (125.6 kg)   SpO2 99%   BMI 35.54 kg/m    Subjective:    [name], male    DOB: 06/08/1945, 75 y.o.   MRN: [id number]  HPI: Derek Wright is a 75 y.o. male presenting on 12/20/2020 for comprehensive medical examination. Current medical complaints include:none  He currently lives with: wife Interim Problems from his last visit: no   HYPERTENSION/HLD Followed by Dr. Clayborn Bigness and last seen 10/13/20, no changes made on review of note. Continues on Metoprolol XL and Lasix + Atorvastatin for HLD.  Does endorse not always following low sodium diet at home.  Continues on Omeprazole at home for GERD.  Aortic atherosclerosis noted on CT imaging 08/17/17. Hypertension status: stable  Satisfied with current treatment? yes Duration of hypertension: chronic BP monitoring frequency:  not checking BP range:  BP medication side effects:  no Medication compliance: good compliance Aspirin: no Recurrent headaches: no Visual changes: no Palpitations: no Dyspnea: no Chest pain: no Lower extremity edema: yes, at baseline, no worse Dizzy/lightheaded: no  The 10-year ASCVD risk score (Arnett DK, et al., 2019) is: 35.6%   Values used to calculate the score:     Age: 61 years     Sex: Male     Is Non-Hispanic African American: Yes     Diabetic: Yes     Tobacco smoker: No     Systolic Blood Pressure: 595 mmHg     Is BP treated: Yes     HDL Cholesterol: 60 mg/dL     Total Cholesterol: 166 mg/dL  PREDIABETES: Last A1C in May 6.4%.  He is currently diet controlled.  Does not check BS at home. Polydipsia/polyuria: no Visual disturbance: no -- had eye exam yesterday Chest pain: no Paresthesias: no  Functional Status Survey: Is the patient deaf or have difficulty hearing?: No Does the patient have difficulty seeing, even when wearing glasses/contacts?: No Does the patient have difficulty  concentrating, remembering, or making decisions?: No Does the patient have difficulty walking or climbing stairs?: No Does the patient have difficulty dressing or bathing?: No Does the patient have difficulty doing errands alone such as visiting a doctor's office or shopping?: No  FALL RISK: Fall Risk  12/20/2020 01/26/2020 12/12/2019 10/09/2018 08/20/2017  Falls in the past year? 1 0 0 0 Yes  Number falls in past yr: 0 - 0 0 1  Injury with Fall? 1 - 0 0 No  Risk for fall due to : History of fall(s) Medication side effect No Fall Risks - -  Follow up Falls evaluation completed Falls evaluation completed;Education provided;Falls prevention discussed Falls evaluation completed Falls evaluation completed Falls evaluation completed    Depression Screen Depression screen [facility] 2/9 12/20/2020 01/26/2020 12/12/2019 10/09/2018 08/20/2017  Decreased Interest 0 0 0 0 0  Down, Depressed, Hopeless 0 0 0 0 0  PHQ - 2 Score 0 0 0 0 0  Altered sleeping 0 - - 1 -  Tired, decreased energy 0 - - 1 -  Change in appetite 0 - - 0 -  Feeling bad or failure about yourself  0 - - 0 -  Trouble concentrating 0 - - 0 -  Moving slowly or fidgety/restless 0 - - 0 -  Suicidal thoughts 0 - - 0 -  PHQ-9 Score 0 - - 2 -  Difficult doing work/chores Not difficult at all - - Not difficult at all -    Advanced Directives <no information>  Past Medical History:  Past Medical History:  Diagnosis Date   Arthritis    GERD (gastroesophageal reflux disease)    RARE-NO MEDS   Hyperlipidemia    Hypertension    Pre-diabetes    Sleep apnea    MILD-NO CPAP-COULD NOT TOELERATE    Surgical History:  Past Surgical History:  Procedure Laterality Date   COLONOSCOPY  2012   COLONOSCOPY WITH PROPOFOL N/A 07/20/2020   Procedure: COLONOSCOPY WITH PROPOFOL;  Surgeon: Virgel Manifold, MD;  Location: [facility] ENDOSCOPY;  Service: Endoscopy;  Laterality: N/A;   hair folicle removed     HERNIA REPAIR     INGUINAL HERNIA REPAIR  Bilateral 09/29/2015   Procedure: LAPAROSCOPIC BILATERAL INGUINAL HERNIA REPAIR;  Surgeon: Robert Bellow, MD;  Location: [facility] ORS;  Service: General;  Laterality: Bilateral;   SKIN GRAFT Right 1966   hand    Medications:  Current Outpatient Medications on File Prior to Visit  Medication Sig   atorvastatin (LIPITOR) 40 MG tablet Take 1 tablet (40 mg total) by mouth daily.   cyclobenzaprine (FLEXERIL) 5 MG tablet Take 1 tablet (5 mg total) by mouth 3 (three) times daily as needed for muscle spasms.   FLOWFLEX COVID-19 AG HOME TEST KIT See admin instructions.   furosemide (LASIX) 20 MG tablet Take by mouth.   meloxicam (MOBIC) 7.5 MG tablet Take 1 tablet (7.5 mg total) by mouth daily.   MODERNA COVID-19 VACCINE 100 MCG/0.5ML injection    polyethylene glycol-electrolytes (GAVILYTE-N WITH FLAVOR PACK) 420 g solution Drink one 8 oz glass every 20 mins until entire container is finished starting at 5:00pm on 07/19/20   No current facility-administered medications on file prior to visit.    Allergies:  No Known Allergies  Social History:  Social History   Socioeconomic History   Marital status: Married    Spouse name: Not on file   Number of children: Not on file   Years of education: 12   Highest education level: High school graduate  Occupational History   Not on file  Tobacco Use   Smoking status: Former    Packs/day: 0.25    Years: 15.00    Pack years: 3.75    Types: Cigarettes    Quit date: 09/13/1980    Years since quitting: 40.2   Smokeless tobacco: Never  Vaping Use   Vaping Use: Never used  Substance and Sexual Activity   Alcohol use: Yes    Alcohol/week: 2.0 standard drinks    Types: 2 Cans of beer per week   Drug use: No   Sexual activity: Not on file  Other Topics Concern   Not on file  Social History Narrative   Not on file   Social Determinants of Health   Financial Resource Strain: Low Risk    Difficulty of Paying Living Expenses: Not hard at all   Food Insecurity: No Food Insecurity   Worried About Charity fundraiser in the Last Year: Never true   [place] in the Last Year: Never true  Transportation Needs: No Transportation Needs   Lack of Transportation (Medical): No   Lack of Transportation (Non-Medical): No  Physical Activity: Inactive   Days of Exercise per Week: 0 days   Minutes of Exercise per Session: 0 min  Stress: No Stress Concern Present   Feeling of Stress : Not  at all  Social Connections: Moderately Integrated   Frequency of Communication with Friends and Family: More than three times a week   Frequency of Social Gatherings with Friends and Family: More than three times a week   Attends Religious Services: More than 4 times per year   Active Member of [employer] or Organizations: No   Attends Music therapist: Never   Marital Status: Married  Human resources officer Violence: Not At Risk   Fear of Current or Ex-Partner: No   Emotionally Abused: No   Physically Abused: No   Sexually Abused: No   Social History   Tobacco Use  Smoking Status Former   Packs/day: 0.25   Years: 15.00   Pack years: 3.75   Types: Cigarettes   Quit date: 09/13/1980   Years since quitting: 40.2  Smokeless Tobacco Never   Social History   Substance and Sexual Activity  Alcohol Use Yes   Alcohol/week: 2.0 standard drinks   Types: 2 Cans of beer per week    Family History:  Family History  Problem Relation Age of Onset   Ovarian cancer Mother    Heart disease Father    Lymphoma Maternal Uncle     Past medical history, surgical history, medications, allergies, family history and social history reviewed with patient today and changes made to appropriate areas of the chart.   Review of Systems - negative All other ROS negative except what is listed above and in the HPI.      Objective:    BP 135/64   Pulse 74   Temp 99.1 F (37.3 C) (Oral)   Ht 6' 2" (1.88 m)   Wt 276 lb 12.8 oz (125.6 kg)   SpO2 99%    BMI 35.54 kg/m   Wt Readings from Last 3 Encounters:  12/20/20 276 lb 12.8 oz (125.6 kg)  10/15/20 274 lb (124.3 kg)  09/15/20 280 lb 6.4 oz (127.2 kg)    Physical Exam Vitals and nursing note reviewed.  Constitutional:      General: He is awake. He is not in acute distress.    Appearance: He is well-developed. He is obese. He is not ill-appearing.  HENT:     Head: Normocephalic and atraumatic.     Right Ear: Hearing, tympanic membrane, ear canal and external ear normal. No drainage.     Left Ear: Hearing, tympanic membrane, ear canal and external ear normal. No drainage.     Nose: Nose normal.     Mouth/Throat:     Pharynx: Oropharynx is clear. Uvula midline.  Eyes:     General: Lids are normal.        Right eye: No discharge.        Left eye: No discharge.     Extraocular Movements: Extraocular movements intact.     Conjunctiva/sclera: Conjunctivae normal.     Pupils: Pupils are equal, round, and reactive to light.     Visual Fields: Right eye visual fields normal and left eye visual fields normal.  Neck:     Thyroid: No thyromegaly.     Vascular: No carotid bruit.  Cardiovascular:     Rate and Rhythm: Normal rate and regular rhythm.     Heart sounds: Normal heart sounds, S1 normal and S2 normal. No murmur heard.   No gallop.  Pulmonary:     Effort: Pulmonary effort is normal. No accessory muscle usage or respiratory distress.     Breath sounds: Normal breath sounds.  Abdominal:  General: Bowel sounds are normal.     Palpations: Abdomen is soft. There is no hepatomegaly or splenomegaly.     Tenderness: There is no abdominal tenderness.     Hernia: There is no hernia in the left inguinal area or right inguinal area.  Genitourinary:    Penis: Normal.      Testes: Normal.  Musculoskeletal:        General: Normal range of motion.     Cervical back: Normal range of motion and neck supple.     Right lower leg: 2+ Pitting Edema present.     Left lower leg: 2+ Pitting  Edema present.  Lymphadenopathy:     Head:     Right side of head: No submental, submandibular, tonsillar, preauricular or posterior auricular adenopathy.     Left side of head: No submental, submandibular, tonsillar, preauricular or posterior auricular adenopathy.     Cervical: No cervical adenopathy.  Skin:    General: Skin is warm and dry.     Capillary Refill: Capillary refill takes less than 2 seconds.     Findings: No rash.  Neurological:     Mental Status: He is alert and oriented to person, place, and time.     Gait: Gait is intact.     Deep Tendon Reflexes:     Reflex Scores:      Brachioradialis reflexes are 1+ on the right side and 1+ on the left side.      Patellar reflexes are 1+ on the right side and 1+ on the left side. Psychiatric:        Attention and Perception: Attention normal.        Mood and Affect: Mood normal.        Speech: Speech normal.        Behavior: Behavior normal. Behavior is cooperative.        Thought Content: Thought content normal.        Cognition and Memory: Cognition normal.        Judgment: Judgment normal.   6CIT Screen 12/20/2020 01/26/2020 12/12/2019 10/09/2018 08/20/2017  What Year? 0 points 0 points 0 points 0 points 0 points  What month? 0 points 0 points 0 points 0 points 0 points  What time? 0 points 0 points 0 points 0 points 0 points  Count back from 20 0 points 0 points 0 points 0 points 0 points  Months in reverse 0 points 0 points 0 points 0 points 0 points  Repeat phrase 0 points 0 points 0 points 0 points 0 points  Total Score 0 0 0 0 0     Diabetic Foot Exam - Simple   Simple Foot Form Visual Inspection See comments: Yes Sensation Testing Intact to touch and monofilament testing bilaterally: Yes Pulse Check Posterior Tibialis and Dorsalis pulse intact bilaterally: Yes Comments Xerosis noted on exam bilateral feet.     Results for orders placed or performed in visit on 12/14/20  HM DIABETES EYE EXAM  Result Value  Ref Range   HM Diabetic Eye Exam No Retinopathy No Retinopathy      Assessment & Plan:   Problem List Items Addressed This Visit       Cardiovascular and Mediastinum   Atherosclerosis of aorta ([place])    Noted on CT 08/17/17, recommend continued use of statin daily and adding a daily Baby ASA 81 MG for prevention.  Recommend continued cessation of smoking, he quit in 1982.      Relevant Medications  metoprolol succinate (TOPROL-XL) 25 MG 24 hr tablet   HTN (hypertension)    Chronic, stable with BP below goal on recheck today.  Continue current medication regimen + collaboration with cardiology and recommend checking BP daily at home and documenting + focus on DASH diet.  Labs today: CMP, TSH, CBC, urine ALB.  Urine ALB 30 today, could consider adding ARB (no ACE, previous smoker) in future for BP and proteinuria.  Return in 6 months.      Relevant Medications   metoprolol succinate (TOPROL-XL) 25 MG 24 hr tablet   Other Relevant Orders   Microalbumin, Urine Waived   CBC with Differential/Platelet   Comprehensive metabolic panel   TSH     Genitourinary   BPH without obstruction/lower urinary tract symptoms    Chronic, stable without medication at this time.  PSA today.      Relevant Orders   PSA     Other   Elevated hemoglobin A1c    A1c today trending down 6%, continue diet focus at home.      Relevant Orders   Bayer DCA Hb A1c Waived   Microalbumin, Urine Waived   Heart burn    Chronic, ongoing, stable with Prilosec.  Continue current medication regimen and adjust as needed. Mag level today.      Relevant Orders   Magnesium   Hyperlipidemia    Chronic, ongoing.  Continue daily statin and adjust dose as needed. Recommend he take a daily ASA 81 MG for prevention.  Return in 6 months.  Lipid panel and CMP today.      Relevant Medications   metoprolol succinate (TOPROL-XL) 25 MG 24 hr tablet   Other Relevant Orders   Comprehensive metabolic panel   Lipid Panel w/o  Chol/HDL Ratio   Morbid obesity ([place]) - Primary    BMI 35.54. Recommended eating smaller high protein, low fat meals more frequently and exercising 30 mins a day 5 times a week with a goal of 10-15lb weight loss in the next 3 months. Patient voiced their understanding and motivation to adhere to these recommendations.        Other Visit Diagnoses     Encounter for annual physical exam       Annual physical today with labs. Health maintenance reviewed with patient, declines Shingrix due to cost.        Discussed aspirin prophylaxis for myocardial infarction prevention and decision was made to continue ASA  LABORATORY TESTING:  Health maintenance labs ordered today as discussed above.   The natural history of prostate cancer and ongoing controversy regarding screening and potential treatment outcomes of prostate cancer has been discussed with the patient. The meaning of a false positive PSA and a false negative PSA has been discussed. He indicates understanding of the limitations of this screening test and wishes to proceed with screening PSA testing.   IMMUNIZATIONS:   - Tdap: Tetanus vaccination status reviewed: last tetanus booster within 10 years. - Influenza: Up to date - Pneumovax: Up to date - Prevnar: Up to date - Zostavax vaccine: Not Up To Date == $147 cost  SCREENING: - Colonoscopy: Up to date due again next year Discussed with patient purpose of the colonoscopy is to detect colon cancer at curable precancerous or early stages   - AAA Screening: Reports having done in past -Hearing Test: Not applicable  -Spirometry: Not applicable   PATIENT COUNSELING:    Sexuality: Discussed sexually transmitted diseases, partner selection, use of condoms, avoidance of unintended  pregnancy  and contraceptive alternatives.   Advised to avoid cigarette smoking.  I discussed with the patient that most people either abstain from alcohol or drink within safe limits (<=14/week and <=4  drinks/occasion for males, <=7/weeks and <= 3 drinks/occasion for females) and that the risk for alcohol disorders and other health effects rises proportionally with the number of drinks per week and how often a drinker exceeds daily limits.  Discussed cessation/primary prevention of drug use and availability of treatment for abuse.   Diet: Encouraged to adjust caloric intake to maintain  or achieve ideal body weight, to reduce intake of dietary saturated fat and total fat, to limit sodium intake by avoiding high sodium foods and not adding table salt, and to maintain adequate dietary potassium and calcium preferably from fresh fruits, vegetables, and low-fat dairy products.    Stressed the importance of regular exercise  Injury prevention: Discussed safety belts, safety helmets, smoke detector, smoking near bedding or upholstery.   Dental health: Discussed importance of regular tooth brushing, flossing, and dental visits.   Follow up plan: NEXT PREVENTATIVE PHYSICAL DUE IN 1 YEAR. Return in about 6 months (around 06/19/2021) for PREDIABETES, HTN/HLD, BPH, GERD.

## 2020-12-20 NOTE — Assessment & Plan Note (Signed)
A1c today trending down 6%, continue diet focus at home.

## 2020-12-20 NOTE — Assessment & Plan Note (Signed)
Chronic, ongoing.  Continue daily statin and adjust dose as needed. Recommend he take a daily ASA 81 MG for prevention.  Return in 6 months.  Lipid panel and CMP today.

## 2020-12-20 NOTE — Assessment & Plan Note (Addendum)
Chronic, ongoing, stable with Prilosec.  Continue current medication regimen and adjust as needed. Mag level today.

## 2020-12-20 NOTE — Assessment & Plan Note (Signed)
Chronic, stable without medication at this time.  PSA today. 

## 2020-12-20 NOTE — Patient Instructions (Signed)
Prediabetes Eating Plan °Prediabetes is a condition that causes blood sugar (glucose) levels to be higher than normal. This increases the risk for developing type 2 diabetes (type 2 diabetes mellitus). Working with a health care provider or nutrition specialist (dietitian) to make diet and lifestyle changes can help prevent the onset of diabetes. These changes may help you: °Control your blood glucose levels. °Improve your cholesterol levels. °Manage your blood pressure. °What are tips for following this plan? °Reading food labels °Read food labels to check the amount of fat, salt (sodium), and sugar in prepackaged foods. Avoid foods that have: °Saturated fats. °Trans fats. °Added sugars. °Avoid foods that have more than 300 milligrams (mg) of sodium per serving. Limit your sodium intake to less than 2,300 mg each day. °Shopping °Avoid buying pre-made and processed foods. °Avoid buying drinks with added sugar. °Cooking °Cook with olive oil. Do not use butter, lard, or ghee. °Bake, broil, grill, steam, or boil foods. Avoid frying. °Meal planning ° °Work with your dietitian to create an eating plan that is right for you. This may include tracking how many calories you take in each day. Use a food diary, notebook, or mobile application to track what you eat at each meal. °Consider following a Mediterranean diet. This includes: °Eating several servings of fresh fruits and vegetables each day. °Eating fish at least twice a week. °Eating one serving each day of whole grains, beans, nuts, and seeds. °Using olive oil instead of other fats. °Limiting alcohol. °Limiting red meat. °Using nonfat or low-fat dairy products. °Consider following a plant-based diet. This includes dietary choices that focus on eating mostly vegetables and fruit, grains, beans, nuts, and seeds. °If you have high blood pressure, you may need to limit your sodium intake or follow a diet such as the DASH (Dietary Approaches to Stop Hypertension) eating  plan. The DASH diet aims to lower high blood pressure. °Lifestyle °Set weight loss goals with help from your health care team. It is recommended that most people with prediabetes lose 7% of their body weight. °Exercise for at least 30 minutes 5 or more days a week. °Attend a support group or seek support from a mental health counselor. °Take over-the-counter and prescription medicines only as told by your health care provider. °What foods are recommended? °Fruits °Berries. Bananas. Apples. Oranges. Grapes. Papaya. Mango. Pomegranate. Kiwi. Grapefruit. Cherries. °Vegetables °Lettuce. Spinach. Peas. Beets. Cauliflower. Cabbage. Broccoli. Carrots. Tomatoes. Squash. Eggplant. Herbs. Peppers. Onions. Cucumbers. Brussels sprouts. °Grains °Whole grains, such as whole-wheat or whole-grain breads, crackers, cereals, and pasta. Unsweetened oatmeal. Bulgur. Barley. Quinoa. Brown rice. Corn or whole-wheat flour tortillas or taco shells. °Meats and other proteins °Seafood. Poultry without skin. Lean cuts of pork and beef. Tofu. Eggs. Nuts. Beans. °Dairy °Low-fat or fat-free dairy products, such as yogurt, cottage cheese, and cheese. °Beverages °Water. Tea. Coffee. Sugar-free or diet soda. Seltzer water. Low-fat or nonfat milk. Milk alternatives, such as soy or almond milk. °Fats and oils °Olive oil. Canola oil. Sunflower oil. Grapeseed oil. Avocado. Walnuts. °Sweets and desserts °Sugar-free or low-fat pudding. Sugar-free or low-fat ice cream and other frozen treats. °Seasonings and condiments °Herbs. Sodium-free spices. Mustard. Relish. Low-salt, low-sugar ketchup. Low-salt, low-sugar barbecue sauce. Low-fat or fat-free mayonnaise. °The items listed above may not be a complete list of recommended foods and beverages. Contact a dietitian for more information. °What foods are not recommended? °Fruits °Fruits canned with syrup. °Vegetables °Canned vegetables. Frozen vegetables with butter or cream sauce. °Grains °Refined white  flour and flour   products, such as bread, pasta, snack foods, and cereals. °Meats and other proteins °Fatty cuts of meat. Poultry with skin. Breaded or fried meat. Processed meats. °Dairy °Full-fat yogurt, cheese, or milk. °Beverages °Sweetened drinks, such as iced tea and soda. °Fats and oils °Butter. Lard. Ghee. °Sweets and desserts °Baked goods, such as cake, cupcakes, pastries, cookies, and cheesecake. °Seasonings and condiments °Spice mixes with added salt. Ketchup. Barbecue sauce. Mayonnaise. °The items listed above may not be a complete list of foods and beverages that are not recommended. Contact a dietitian for more information. °Where to find more information °American Diabetes Association: www.diabetes.org °Summary °You may need to make diet and lifestyle changes to help prevent the onset of diabetes. These changes can help you control blood sugar, improve cholesterol levels, and manage blood pressure. °Set weight loss goals with help from your health care team. It is recommended that most people with prediabetes lose 7% of their body weight. °Consider following a Mediterranean diet. This includes eating plenty of fresh fruits and vegetables, whole grains, beans, nuts, seeds, fish, and low-fat dairy, and using olive oil instead of other fats. °This information is not intended to replace advice given to you by your health care provider. Make sure you discuss any questions you have with your health care provider. °Document Revised: 05/01/2019 Document Reviewed: 05/01/2019 °Elsevier Patient Education © 2022 [employer]. ° °

## 2020-12-20 NOTE — Assessment & Plan Note (Signed)
BMI 35.54. Recommended eating smaller high protein, low fat meals more frequently and exercising 30 mins a day 5 times a week with a goal of 10-15lb weight loss in the next 3 months. Patient voiced their understanding and motivation to adhere to these recommendations.

## 2020-12-21 LAB — CBC WITH DIFFERENTIAL/PLATELET
Basophils Absolute: 0.1 10*3/uL (ref 0.0–0.2)
Basos: 2 %
EOS (ABSOLUTE): 0.2 10*3/uL (ref 0.0–0.4)
Eos: 5 %
Hematocrit: 42.6 % (ref 37.5–51.0)
Hemoglobin: 14.1 g/dL (ref 13.0–17.7)
Immature Grans (Abs): 0 10*3/uL (ref 0.0–0.1)
Immature Granulocytes: 0 %
Lymphocytes Absolute: 1.3 10*3/uL (ref 0.7–3.1)
Lymphs: 34 %
MCH: 31.5 pg (ref 26.6–33.0)
MCHC: 33.1 g/dL (ref 31.5–35.7)
MCV: 95 fL (ref 79–97)
Monocytes Absolute: 0.5 10*3/uL (ref 0.1–0.9)
Monocytes: 14 %
Neutrophils Absolute: 1.7 10*3/uL (ref 1.4–7.0)
Neutrophils: 45 %
Platelets: 186 10*3/uL (ref 150–450)
RBC: 4.47 x10E6/uL (ref 4.14–5.80)
RDW: 13.1 % (ref 11.6–15.4)
WBC: 3.9 10*3/uL (ref 3.4–10.8)

## 2020-12-21 LAB — COMPREHENSIVE METABOLIC PANEL
ALT: 15 IU/L (ref 0–44)
AST: 17 IU/L (ref 0–40)
Albumin/Globulin Ratio: 1.9 (ref 1.2–2.2)
Albumin: 4.2 g/dL (ref 3.7–4.7)
Alkaline Phosphatase: 63 IU/L (ref 44–121)
BUN/Creatinine Ratio: 14 (ref 10–24)
BUN: 13 mg/dL (ref 8–27)
Bilirubin Total: 0.6 mg/dL (ref 0.0–1.2)
CO2: 24 mmol/L (ref 20–29)
Calcium: 9.1 mg/dL (ref 8.6–10.2)
Chloride: 103 mmol/L (ref 96–106)
Creatinine, Ser: 0.9 mg/dL (ref 0.76–1.27)
Globulin, Total: 2.2 g/dL (ref 1.5–4.5)
Glucose: 103 mg/dL — ABNORMAL HIGH (ref 70–99)
Potassium: 4.1 mmol/L (ref 3.5–5.2)
Sodium: 140 mmol/L (ref 134–144)
Total Protein: 6.4 g/dL (ref 6.0–8.5)
eGFR: 90 mL/min/{1.73_m2} (ref >59)

## 2020-12-21 LAB — TSH: TSH: 1.85 u[IU]/mL (ref 0.450–4.500)

## 2020-12-21 LAB — LIPID PANEL W/O CHOL/HDL RATIO
Cholesterol, Total: 139 mg/dL (ref 100–199)
HDL: 57 mg/dL (ref >39)
LDL Chol Calc (NIH): 69 mg/dL (ref 0–99)
Triglycerides: 64 mg/dL (ref 0–149)
VLDL Cholesterol Cal: 13 mg/dL (ref 5–40)

## 2020-12-21 LAB — MAGNESIUM: Magnesium: 1.9 mg/dL (ref 1.6–2.3)

## 2020-12-21 LAB — PSA: Prostate Specific Ag, Serum: 3.4 ng/mL (ref 0.0–4.0)

## 2020-12-21 NOTE — Progress Notes (Signed)
Good morning, please let Bless know his labs have returned and overall they are stable.  I would continue all medications as ordered.  No changes needed.  Have a great day!! Keep being awesome!!  Thank you for allowing me to participate in your care.  I appreciate you. Kindest regards, Hayleigh Bawa

## 2021-01-19 ENCOUNTER — Other Ambulatory Visit: Payer: Self-pay

## 2021-01-19 MED ORDER — ACCU-CHEK AVIVA PLUS W/DEVICE KIT: 1.0000 | PACK | Freq: Two times a day (BID) | 0 refills | Status: AC

## 2021-01-19 MED ORDER — ACCU-CHEK AVIVA PLUS VI STRP: 1.0000 | ORAL_STRIP | Freq: Two times a day (BID) | 5 refills | Status: AC

## 2021-01-21 ENCOUNTER — Telehealth: Payer: Self-pay | Admitting: Nurse Practitioner

## 2021-01-21 NOTE — Telephone Encounter (Signed)
Pt is calling to reschedule AWV 01/26/21 to 1:30p or 2:00p. Please advise CB- [phone]

## 2021-01-24 NOTE — Telephone Encounter (Signed)
Lmom asking pt to call back to discuss reschedule.

## 2021-01-25 NOTE — Telephone Encounter (Signed)
Spoke with pt and let him know about schedule change pt is aware of appt time.

## 2021-01-26 ENCOUNTER — Ambulatory Visit (INDEPENDENT_AMBULATORY_CARE_PROVIDER_SITE_OTHER): Payer: Medicare Other | Admitting: *Deleted

## 2021-01-26 DIAGNOSIS — Z0000 Encounter for general adult medical examination without abnormal findings: Secondary | ICD-10-CM | POA: Diagnosis not present

## 2021-01-26 NOTE — Progress Notes (Signed)
Subjective:   Derek Wright is a 75 y.o. male who presents for Medicare Annual/Subsequent preventive examination  I connected with  [name] on 01/26/21 by a telephone enabled telemedicine application and verified that I am speaking with the correct person using two identifiers.   I discussed the limitations of evaluation and management by telemedicine. The patient expressed understanding and agreed to proceed.  Patient location: home  Provider location: Tele-Health not in office    Review of Systems     Cardiac Risk Factors include: advanced age (>78mn, >>7women);male gender;obesity (BMI >30kg/m2)     Objective:    Today's Vitals   There is no height or weight on file to calculate BMI.  Advanced Directives 01/26/2021 10/15/2020 07/20/2020 01/26/2020 09/02/2017 09/01/2017 08/20/2017  Does Patient Have a Medical Advance Directive? No No Yes Yes Yes Yes Yes  Type of Advance Directive - - -Public librarianLiving will HMount VernonLiving will - Living will;Healthcare Power of Attorney  Does patient want to make changes to medical advance directive? - - - - - - -  Copy of HReinholdsin Chart? - - - No - copy requested No - copy requested - No - copy requested  Would patient like information on creating a medical advance directive? No - Patient declined No - Patient declined - - - - -    Current Medications (verified) Outpatient Encounter Medications as of 01/26/2021  Medication Sig   atorvastatin (LIPITOR) 40 MG tablet Take 1 tablet (40 mg total) by mouth daily.   Blood Glucose Monitoring Suppl (ACCU-CHEK AVIVA PLUS) w/Device KIT 1 each by Does not apply route 2 (two) times daily.   cyclobenzaprine (FLEXERIL) 5 MG tablet Take 1 tablet (5 mg total) by mouth 3 (three) times daily as needed for muscle spasms.   FLOWFLEX COVID-19 AG HOME TEST KIT See admin instructions.   furosemide (LASIX) 20 MG tablet Take by mouth.   glucose blood  (ACCU-CHEK AVIVA PLUS) test strip 1 each by Other route 2 (two) times daily.   meloxicam (MOBIC) 7.5 MG tablet Take 1 tablet (7.5 mg total) by mouth daily.   metoprolol succinate (TOPROL-XL) 25 MG 24 hr tablet TAKE ONE TABLET DAILY BY MOUTH.   MODERNA COVID-19 VACCINE 100 MCG/0.5ML injection    omeprazole (PRILOSEC) 40 MG capsule Take 1 capsule (40 mg total) by mouth daily.   polyethylene glycol-electrolytes (GAVILYTE-N WITH FLAVOR PACK) 420 g solution Drink one 8 oz glass every 20 mins until entire container is finished starting at 5:00pm on 07/19/20   No facility-administered encounter medications on file as of 01/26/2021.    Allergies (verified) Patient has no known allergies.   History: Past Medical History:  Diagnosis Date   Arthritis    GERD (gastroesophageal reflux disease)    RARE-NO MEDS   Hyperlipidemia    Hypertension    Pre-diabetes    Sleep apnea    MILD-NO CPAP-COULD NOT TOELERATE   Past Surgical History:  Procedure Laterality Date   COLONOSCOPY  2012   COLONOSCOPY WITH PROPOFOL N/A 07/20/2020   Procedure: COLONOSCOPY WITH PROPOFOL;  Surgeon: TVirgel Manifold MD;  Location: [facility] ENDOSCOPY;  Service: Endoscopy;  Laterality: N/A;   hair folicle removed     HERNIA REPAIR     INGUINAL HERNIA REPAIR Bilateral 09/29/2015   Procedure: LAPAROSCOPIC BILATERAL INGUINAL HERNIA REPAIR;  Surgeon: JRobert Bellow MD;  Location: [facility] ORS;  Service: General;  Laterality: Bilateral;  SKIN GRAFT Right 1966   hand   Family History  Problem Relation Age of Onset   Ovarian cancer Mother    Heart disease Father    Lymphoma Maternal Uncle    Social History   Socioeconomic History   Marital status: Married    Spouse name: Not on file   Number of children: Not on file   Years of education: 12   Highest education level: High school graduate  Occupational History   Not on file  Tobacco Use   Smoking status: Former    Packs/day: 0.25    Years: 15.00    Pack years: 3.75     Types: Cigarettes    Quit date: 09/13/1980    Years since quitting: 40.3   Smokeless tobacco: Never  Vaping Use   Vaping Use: Never used  Substance and Sexual Activity   Alcohol use: Yes    Alcohol/week: 2.0 standard drinks    Types: 2 Cans of beer per week   Drug use: No   Sexual activity: Not on file  Other Topics Concern   Not on file  Social History Narrative   Not on file   Social Determinants of Health   Financial Resource Strain: Low Risk    Difficulty of Paying Living Expenses: Not hard at all  Food Insecurity: No Food Insecurity   Worried About Charity fundraiser in the Last Year: Never true   [place] in the Last Year: Never true  Transportation Needs: No Transportation Needs   Lack of Transportation (Medical): No   Lack of Transportation (Non-Medical): No  Physical Activity: Inactive   Days of Exercise per Week: 0 days   Minutes of Exercise per Session: 0 min  Stress: No Stress Concern Present   Feeling of Stress : Not at all  Social Connections: Moderately Integrated   Frequency of Communication with Friends and Family: Twice a week   Frequency of Social Gatherings with Friends and Family: Three times a week   Attends Religious Services: More than 4 times per year   Active Member of Clubs or Organizations: No   Attends Archivist Meetings: Never   Marital Status: Married    Tobacco Counseling Counseling given: Not Answered   Clinical Intake:  Pre-visit preparation completed: Yes  Pain : No/denies pain     Nutritional Risks: None Diabetes: No  How often do you need to have someone help you when you read instructions, pamphlets, or other written materials from your doctor or pharmacy?: 1 - Never  Diabetic?  no  Interpreter Needed?: No  Information entered by :: Leroy Kennedy LPN   Activities of Daily Living In your present state of health, do you have any difficulty performing the following activities: 01/26/2021 12/20/2020   Hearing? N N  Vision? N N  Difficulty concentrating or making decisions? N N  Walking or climbing stairs? N N  Dressing or bathing? N N  Doing errands, shopping? N N  Preparing Food and eating ? N -  Using the Toilet? N -  In the past six months, have you accidently leaked urine? N -  Do you have problems with loss of bowel control? N -  Managing your Medications? N -  Managing your Finances? N -  Housekeeping or managing your Housekeeping? N -  Some recent data might be hidden    Patient Care Team: Venita Lick, NP as PCP - General (Nurse Practitioner) Kem Parkinson, MD (Ophthalmology) Phylis Bougie,  Welford Roche, MD (General Surgery) Yolonda Kida, MD as Consulting Physician (Cardiology)  Indicate any recent Medical Services you may have received from other than [facility] providers in the past year (date may be approximate).     Assessment:   This is a routine wellness examination for Aidenn.  Hearing/Vision screen Hearing Screening - Comments:: No trouble hearing  Vision Screening - Comments:: Up to date [facility]  Dietary issues and exercise activities discussed: Current Exercise Habits: The patient does not participate in regular exercise at present   Goals Addressed             This Visit's Progress    Increase water intake   On track    Recommend drinking at least 4-5 glasses of water a day     Patient Stated       Increase physical activity        Depression Screen PHQ 2/9 Scores 01/26/2021 12/20/2020 01/26/2020 12/12/2019 10/09/2018 08/20/2017 12/12/2016  PHQ - 2 Score 0 0 0 0 0 0 0  PHQ- 9 Score 0 0 - - 2 - -    Fall Risk Fall Risk  01/26/2021 12/20/2020 01/26/2020 12/12/2019 10/09/2018  Falls in the past year? 0 1 0 0 0  Number falls in past yr: 0 0 - 0 0  Injury with Fall? 0 1 - 0 0  Risk for fall due to : - History of fall(s) Medication side effect No Fall Risks -  Follow up Falls evaluation completed;Falls prevention discussed Falls evaluation  completed Falls evaluation completed;Education provided;Falls prevention discussed Falls evaluation completed Falls evaluation completed    FALL RISK PREVENTION PERTAINING TO THE HOME:  Any stairs in or around the home? No  If so, are there any without handrails? No  Home free of loose throw rugs in walkways, pet beds, electrical cords, etc? Yes  Adequate lighting in your home to reduce risk of falls? Yes   ASSISTIVE DEVICES UTILIZED TO PREVENT FALLS:  Life alert? No  Use of a cane, walker or w/c? No  Grab bars in the bathroom? No  Shower chair or bench in shower? No  Elevated toilet seat or a handicapped toilet? Yes   TIMED UP AND GO:  Was the test performed? No .    Cognitive Function:  Normal cognitive status assessed by direct observation by this Nurse Health Advisor. No abnormalities found.       6CIT Screen 12/20/2020 01/26/2020 12/12/2019 10/09/2018 08/20/2017  What Year? 0 points 0 points 0 points 0 points 0 points  What month? 0 points 0 points 0 points 0 points 0 points  What time? 0 points 0 points 0 points 0 points 0 points  Count back from 20 0 points 0 points 0 points 0 points 0 points  Months in reverse 0 points 0 points 0 points 0 points 0 points  Repeat phrase 0 points 0 points 0 points 0 points 0 points  Total Score 0 0 0 0 0    Immunizations Immunization History  Administered Date(s) Administered   Fluad Quad(high Dose 65+) 10/09/2018, 10/27/2019, 11/14/2019   Influenza,inj,Quad PF,6+ Mos 11/21/2016   Influenza,inj,quad, With Preservative 12/01/2016   Influenza-Unspecified 11/12/2013, 12/29/2014, 11/01/2015, 11/14/2017, 11/13/2020   Moderna Sars-Covid-2 Vaccination 03/28/2019, 05/03/2019   PFIZER(Purple Top)SARS-COV-2 Vaccination 12/23/2019   Pneumococcal Conjugate-13 09/13/2015   Pneumococcal Polysaccharide-23 03/25/2009, 08/20/2017   Pneumococcal-Unspecified 03/25/2009   Tdap 05/08/2013   Zoster, Live 09/25/2011    TDAP status: Up to date  Flu  Vaccine  status: Up to date  Pneumococcal vaccine status: Up to date  Covid-19 vaccine status: Information provided on how to obtain vaccines.   Qualifies for Shingles Vaccine? Yes   Zostavax completed Yes   Shingrix Completed?: No.    Education has been provided regarding the importance of this vaccine. Patient has been advised to call insurance company to determine out of pocket expense if they have not yet received this vaccine. Advised may also receive vaccine at local pharmacy or Health Dept. Verbalized acceptance and understanding.  Screening Tests Health Maintenance  Topic Date Due   COVID-19 Vaccine (4 - Booster for Moderna series) 02/17/2020   Zoster Vaccines- Shingrix (1 of 2) 03/22/2021 (Originally 07/18/1997)   HEMOGLOBIN A1C  06/19/2021   COLONOSCOPY (Pts 45-27yr Insurance coverage will need to be confirmed)  07/20/2021   OPHTHALMOLOGY EXAM  12/10/2021   FOOT EXAM  12/20/2021   URINE MICROALBUMIN  12/20/2021   TETANUS/TDAP  05/09/2023   Pneumonia Vaccine 75 Years old  Completed   INFLUENZA VACCINE  Completed   Hepatitis C Screening  Completed   HPV VACCINES  Aged Out    Health Maintenance  Health Maintenance Due  Topic Date Due   COVID-19 Vaccine (4 - Booster for Moderna series) 02/17/2020    Colorectal cancer screening: Type of screening: Colonoscopy. Completed 2022. Repeat every 3-6 months years Patient will speak with Doctor   Lung Cancer Screening: (Low Dose CT Chest recommended if Age 73-80years, 30 pack-year currently smoking OR have quit w/in 15years.) does not qualify.   Lung Cancer Screening Referral:   Additional Screening:  Hepatitis C Screening: does not qualify; Completed 2017  Vision Screening: Recommended annual ophthalmology exams for early detection of glaucoma and other disorders of the eye. Is the patient up to date with their annual eye exam?  Yes  Who is the provider or what is the name of the office in which the patient attends annual  [facility] not [facility] a provider, would they like to be referred to a provider to establish care? No .   Dental Screening: Recommended annual dental exams for proper oral hygiene  Community Resource Referral / Chronic Care Management: CRR required this visit?  No   CCM required this visit?  No      Plan:     I have personally reviewed and noted the following in the patients chart:   Medical and social history Use of alcohol, tobacco or illicit drugs  Current medications and supplements including opioid prescriptions. Patient is not currently taking opioid prescriptions. Functional ability and status Nutritional status Physical activity Advanced directives List of other physicians Hospitalizations, surgeries, and ER visits in previous 12 months Vitals Screenings to include cognitive, depression, and falls Referrals and appointments  In addition, I have reviewed and discussed with patient certain preventive protocols, quality metrics, and best practice recommendations. A written personalized care plan for preventive services as well as general preventive health recommendations were provided to patient.     JLeroy Kennedy LPN   [id number]  Nurse Notes: Patient report for Colonoscopy state 3-6 month repeat , poor preparation, the path report was negative.  Patient states he was told fine didn't need to repeat.?

## 2021-01-26 NOTE — Patient Instructions (Signed)
Derek Wright , Thank you for taking time to come for your Medicare Wellness Visit. I appreciate your ongoing commitment to your health goals. Please review the following plan we discussed and let me know if I can assist you in the future.   Screening recommendations/referrals: Colonoscopy: Education provided Recommended yearly ophthalmology/optometry visit for glaucoma screening and checkup Recommended yearly dental visit for hygiene and checkup  Vaccinations: Influenza vaccine: up to date Pneumococcal vaccine: up to date Tdap vaccine: up to date Shingles vaccine: education provided    Advanced directives: Education provided  Conditions/risks identified:   Next appointment: 06-20-2021 @ 8:00 [place]  Preventive Care 80 Years and Older, Male Preventive care refers to lifestyle choices and visits with your health care provider that can promote health and wellness. What does preventive care include? A yearly physical exam. This is also called an annual well check. Dental exams once or twice a year. Routine eye exams. Ask your health care provider how often you should have your eyes checked. Personal lifestyle choices, including: Daily care of your teeth and gums. Regular physical activity. Eating a healthy diet. Avoiding tobacco and drug use. Limiting alcohol use. Practicing safe sex. Taking low doses of aspirin every day. Taking vitamin and mineral supplements as recommended by your health care provider. What happens during an annual well check? The services and screenings done by your health care provider during your annual well check will depend on your age, overall health, lifestyle risk factors, and family history of disease. Counseling  Your health care provider may ask you questions about your: Alcohol use. Tobacco use. Drug use. Emotional well-being. Home and relationship well-being. Sexual activity. Eating habits. History of falls. Memory and ability to understand  (cognition). Work and work Statistician. Screening  You may have the following tests or measurements: Height, weight, and BMI. Blood pressure. Lipid and cholesterol levels. These may be checked every 5 years, or more frequently if you are over 15 years old. Skin check. Lung cancer screening. You may have this screening every year starting at age 79 if you have a 30-pack-year history of smoking and currently smoke or have quit within the past 15 years. Fecal occult blood test (FOBT) of the stool. You may have this test every year starting at age 15. Flexible sigmoidoscopy or colonoscopy. You may have a sigmoidoscopy every 5 years or a colonoscopy every 10 years starting at age 8. Prostate cancer screening. Recommendations will vary depending on your family history and other risks. Hepatitis C blood test. Hepatitis B blood test. Sexually transmitted disease (STD) testing. Diabetes screening. This is done by checking your blood sugar (glucose) after you have not eaten for a while (fasting). You may have this done every 1-3 years. Abdominal aortic aneurysm (AAA) screening. You may need this if you are a current or former Wright. Osteoporosis. You may be screened starting at age 44 if you are at high risk. Talk with your health care provider about your test results, treatment options, and if necessary, the need for more tests. Vaccines  Your health care provider may recommend certain vaccines, such as: Influenza vaccine. This is recommended every year. Tetanus, diphtheria, and acellular pertussis (Tdap, Td) vaccine. You may need a Td booster every 10 years. Zoster vaccine. You may need this after age 66. Pneumococcal 13-valent conjugate (PCV13) vaccine. One dose is recommended after age 44. Pneumococcal polysaccharide (PPSV23) vaccine. One dose is recommended after age 22. Talk to your health care provider about which screenings and vaccines you  need and how often you need them. This  information is not intended to replace advice given to you by your health care provider. Make sure you discuss any questions you have with your health care provider. Document Released: 02/26/2015 Document Revised: 10/20/2015 Document Reviewed: 12/01/2014 Elsevier Interactive Patient Education  2017 [place] Prevention in the Home Falls can cause injuries. They can happen to people of all ages. There are many things you can do to make your home safe and to help prevent falls. What can I do on the outside of my home? Regularly fix the edges of walkways and driveways and fix any cracks. Remove anything that might make you trip as you walk through a door, such as a raised step or threshold. Trim any bushes or trees on the path to your home. Use bright outdoor lighting. Clear any walking paths of anything that might make someone trip, such as rocks or tools. Regularly check to see if handrails are loose or broken. Make sure that both sides of any steps have handrails. Any raised decks and porches should have guardrails on the edges. Have any leaves, snow, or ice cleared regularly. Use sand or salt on walking paths during winter. Clean up any spills in your garage right away. This includes oil or grease spills. What can I do in the bathroom? Use night lights. Install grab bars by the toilet and in the tub and shower. Do not use towel bars as grab bars. Use non-skid mats or decals in the tub or shower. If you need to sit down in the shower, use a plastic, non-slip stool. Keep the floor dry. Clean up any water that spills on the floor as soon as it happens. Remove soap buildup in the tub or shower regularly. Attach bath mats securely with double-sided non-slip rug tape. Do not have throw rugs and other things on the floor that can make you trip. What can I do in the bedroom? Use night lights. Make sure that you have a light by your bed that is easy to reach. Do not use any sheets or  blankets that are too big for your bed. They should not hang down onto the floor. Have a firm chair that has side arms. You can use this for support while you get dressed. Do not have throw rugs and other things on the floor that can make you trip. What can I do in the kitchen? Clean up any spills right away. Avoid walking on wet floors. Keep items that you use a lot in easy-to-reach places. If you need to reach something above you, use a strong step stool that has a grab bar. Keep electrical cords out of the way. Do not use floor polish or wax that makes floors slippery. If you must use wax, use non-skid floor wax. Do not have throw rugs and other things on the floor that can make you trip. What can I do with my stairs? Do not leave any items on the stairs. Make sure that there are handrails on both sides of the stairs and use them. Fix handrails that are broken or loose. Make sure that handrails are as long as the stairways. Check any carpeting to make sure that it is firmly attached to the stairs. Fix any carpet that is loose or worn. Avoid having throw rugs at the top or bottom of the stairs. If you do have throw rugs, attach them to the floor with carpet tape. Make sure that  you have a light switch at the top of the stairs and the bottom of the stairs. If you do not have them, ask someone to add them for you. What else can I do to help prevent falls? Wear shoes that: Do not have high heels. Have rubber bottoms. Are comfortable and fit you well. Are closed at the toe. Do not wear sandals. If you use a stepladder: Make sure that it is fully opened. Do not climb a closed stepladder. Make sure that both sides of the stepladder are locked into place. Ask someone to hold it for you, if possible. Clearly mark and make sure that you can see: Any grab bars or handrails. First and last steps. Where the edge of each step is. Use tools that help you move around (mobility aids) if they are  needed. These include: Canes. Walkers. Scooters. Crutches. Turn on the lights when you go into a dark area. Replace any light bulbs as soon as they burn out. Set up your furniture so you have a clear path. Avoid moving your furniture around. If any of your floors are uneven, fix them. If there are any pets around you, be aware of where they are. Review your medicines with your doctor. Some medicines can make you feel dizzy. This can increase your chance of falling. Ask your doctor what other things that you can do to help prevent falls. This information is not intended to replace advice given to you by your health care provider. Make sure you discuss any questions you have with your health care provider. Document Released: 11/26/2008 Document Revised: 07/08/2015 Document Reviewed: 03/06/2014 Elsevier Interactive Patient Education  2017 [employer].

## 2021-04-05 DIAGNOSIS — I700 Atherosclerosis of aorta: Secondary | ICD-10-CM | POA: Diagnosis not present

## 2021-04-05 DIAGNOSIS — I498 Other specified cardiac arrhythmias: Secondary | ICD-10-CM | POA: Diagnosis not present

## 2021-04-05 DIAGNOSIS — E782 Mixed hyperlipidemia: Secondary | ICD-10-CM | POA: Diagnosis not present

## 2021-04-05 DIAGNOSIS — G4733 Obstructive sleep apnea (adult) (pediatric): Secondary | ICD-10-CM | POA: Diagnosis not present

## 2021-04-05 DIAGNOSIS — E119 Type 2 diabetes mellitus without complications: Secondary | ICD-10-CM | POA: Diagnosis not present

## 2021-05-12 ENCOUNTER — Ambulatory Visit (INDEPENDENT_AMBULATORY_CARE_PROVIDER_SITE_OTHER): Payer: Medicare Other | Admitting: Family Medicine

## 2021-05-12 ENCOUNTER — Encounter: Payer: Self-pay | Admitting: Family Medicine

## 2021-05-12 ENCOUNTER — Ambulatory Visit: Payer: Self-pay

## 2021-05-12 VITALS — BP 132/80 | HR 72 | Temp 98.1°F | Wt 273.0 lb

## 2021-05-12 DIAGNOSIS — R079 Chest pain, unspecified: Secondary | ICD-10-CM

## 2021-05-12 MED ORDER — OMEPRAZOLE 40 MG PO CPDR: 40.0000 mg | DELAYED_RELEASE_CAPSULE | Freq: Two times a day (BID) | ORAL | 1 refills | Status: DC

## 2021-05-12 NOTE — Progress Notes (Signed)
? ?BP 132/80   Pulse 72   Temp 98.1 ?F (36.7 ?C)   Wt 273 lb (123.8 kg)   SpO2 98%   BMI 35.05 kg/m?   ? ?Subjective:  ? ? [name], male    DOB: 1945/11/15, 76 y.o.   MRN: [id number] ? ?HPI: ?Derek Wright is a 76 y.o. male ? ?Chief Complaint  ?Patient presents with  ? Chest Pain  ?  Patient states since Tuesday he has had chest discomfort on and off. Patient states it is in the middle of his chest.   ? ?CHEST PAIN ?Duration:3 days ?Onset: sudden ?Quality: dull ?Severity: moderate ?Location: substernal ?Radiation: none ?Episode duration: constant ?Frequency: waning ?Related to exertion: no ?Activity when pain started: push mowing ?Trauma: no ?Anxiety/recent stressors: no ?Status: better ?Treatments attempted: nothing  ?Current pain status: in pain ?Shortness of breath: no ?Cough: no ?Nausea: no ?Diaphoresis: no ?Heartburn: yes ?Palpitations: no ? ?Relevant past medical, surgical, family and social history reviewed and updated as indicated. Interim medical history since our last visit reviewed. ?Allergies and medications reviewed and updated. ? ?Review of Systems  ?Constitutional: Negative.   ?HENT: Negative.    ?Respiratory: Negative.    ?Cardiovascular:  Positive for chest pain. Negative for palpitations and leg swelling.  ?Gastrointestinal: Negative.   ?Musculoskeletal: Negative.   ?Neurological: Negative.   ?Psychiatric/Behavioral: Negative.    ? ?Per HPI unless specifically indicated above ? ?   ?Objective:  ?  ?BP 132/80   Pulse 72   Temp 98.1 ?F (36.7 ?C)   Wt 273 lb (123.8 kg)   SpO2 98%   BMI 35.05 kg/m?   ?Wt Readings from Last 3 Encounters:  ?05/12/21 273 lb (123.8 kg)  ?12/20/20 276 lb 12.8 oz (125.6 kg)  ?10/15/20 274 lb (124.3 kg)  ?  ?Physical Exam ?Vitals and nursing note reviewed.  ?Constitutional:   ?   General: He is not in acute distress. ?   Appearance: Normal appearance. He is not ill-appearing, toxic-appearing or diaphoretic.  ?HENT:  ?   Head: Normocephalic and  atraumatic.  ?   Right Ear: External ear normal.  ?   Left Ear: External ear normal.  ?   Nose: Nose normal.  ?   Mouth/Throat:  ?   Mouth: Mucous membranes are moist.  ?   Pharynx: Oropharynx is clear.  ?Eyes:  ?   General: No scleral icterus.    ?   Right eye: No discharge.     ?   Left eye: No discharge.  ?   Extraocular Movements: Extraocular movements intact.  ?   Conjunctiva/sclera: Conjunctivae normal.  ?   Pupils: Pupils are equal, round, and reactive to light.  ?Cardiovascular:  ?   Rate and Rhythm: Normal rate and regular rhythm.  ?   Pulses: Normal pulses.  ?   Heart sounds: Normal heart sounds. No murmur heard. ?  No friction rub. No gallop.  ?Pulmonary:  ?   Effort: Pulmonary effort is normal. No respiratory distress.  ?   Breath sounds: Normal breath sounds. No stridor. No wheezing, rhonchi or rales.  ?Chest:  ?   Chest wall: No tenderness.  ?Musculoskeletal:     ?   General: Normal range of motion.  ?   Cervical back: Normal range of motion and neck supple.  ?Skin: ?   General: Skin is warm and dry.  ?   Capillary Refill: Capillary refill takes less than 2 seconds.  ?  Coloration: Skin is not jaundiced or pale.  ?   Findings: No bruising, erythema, lesion or rash.  ?Neurological:  ?   General: No focal deficit present.  ?   Mental Status: He is alert and oriented to person, place, and time. Mental status is at baseline.  ?Psychiatric:     ?   Mood and Affect: Mood normal.     ?   Behavior: Behavior normal.     ?   Thought Content: Thought content normal.     ?   Judgment: Judgment normal.  ? ? ?Results for orders placed or performed in visit on 12/20/20  ?Bayer DCA Hb A1c Waived  ?Result Value Ref Range  ? HB A1C (BAYER DCA - WAIVED) 6.0 (H) 4.8 - 5.6 %  ?Microalbumin, Urine Waived  ?Result Value Ref Range  ? Microalb, Ur Waived 30 (H) 0 - 19 mg/L  ? Creatinine, Urine Waived 200 10 - 300 mg/dL  ? Microalb/Creat Ratio <30 <30 mg/g  ?CBC with Differential/Platelet  ?Result Value Ref Range  ? WBC 3.9  3.4 - 10.8 x10E3/uL  ? RBC 4.47 4.14 - 5.80 x10E6/uL  ? Hemoglobin 14.1 13.0 - 17.7 g/dL  ? Hematocrit 42.6 37.5 - 51.0 %  ? MCV 95 79 - 97 fL  ? MCH 31.5 26.6 - 33.0 pg  ? MCHC 33.1 31.5 - 35.7 g/dL  ? RDW 13.1 11.6 - 15.4 %  ? Platelets 186 150 - 450 x10E3/uL  ? Neutrophils 45 Not Estab. %  ? Lymphs 34 Not Estab. %  ? Monocytes 14 Not Estab. %  ? Eos 5 Not Estab. %  ? Basos 2 Not Estab. %  ? Neutrophils Absolute 1.7 1.4 - 7.0 x10E3/uL  ? Lymphocytes Absolute 1.3 0.7 - 3.1 x10E3/uL  ? Monocytes Absolute 0.5 0.1 - 0.9 x10E3/uL  ? EOS (ABSOLUTE) 0.2 0.0 - 0.4 x10E3/uL  ? Basophils Absolute 0.1 0.0 - 0.2 x10E3/uL  ? Immature Granulocytes 0 Not Estab. %  ? Immature Grans (Abs) 0.0 0.0 - 0.1 x10E3/uL  ?Comprehensive metabolic panel  ?Result Value Ref Range  ? Glucose 103 (H) 70 - 99 mg/dL  ? BUN 13 8 - 27 mg/dL  ? Creatinine, Ser 0.90 0.76 - 1.27 mg/dL  ? eGFR 90 >59 mL/min/1.73  ? BUN/Creatinine Ratio 14 10 - 24  ? Sodium 140 134 - 144 mmol/L  ? Potassium 4.1 3.5 - 5.2 mmol/L  ? Chloride 103 96 - 106 mmol/L  ? CO2 24 20 - 29 mmol/L  ? Calcium 9.1 8.6 - 10.2 mg/dL  ? Total Protein 6.4 6.0 - 8.5 g/dL  ? Albumin 4.2 3.7 - 4.7 g/dL  ? Globulin, Total 2.2 1.5 - 4.5 g/dL  ? Albumin/Globulin Ratio 1.9 1.2 - 2.2  ? Bilirubin Total 0.6 0.0 - 1.2 mg/dL  ? Alkaline Phosphatase 63 44 - 121 IU/L  ? AST 17 0 - 40 IU/L  ? ALT 15 0 - 44 IU/L  ?TSH  ?Result Value Ref Range  ? TSH 1.850 0.450 - 4.500 uIU/mL  ?PSA  ?Result Value Ref Range  ? Prostate Specific Ag, Serum 3.4 0.0 - 4.0 ng/mL  ?Lipid Panel w/o Chol/HDL Ratio  ?Result Value Ref Range  ? Cholesterol, Total 139 100 - 199 mg/dL  ? Triglycerides 64 0 - 149 mg/dL  ? HDL 57 >39 mg/dL  ? VLDL Cholesterol Cal 13 5 - 40 mg/dL  ? LDL Chol Calc (NIH) 69 0 - 99 mg/dL  ?Magnesium  ?Result  Value Ref Range  ? Magnesium 1.9 1.6 - 2.3 mg/dL  ? ?   ?Assessment & Plan:  ? ?Problem List Items Addressed This Visit   ?None ?Visit Diagnoses   ? ? Chest pain, unspecified type    -  Primary  ? EKG  normal. Will increase omeprazole and get him back into see cardiology. Follow up with PCP as scheduled.   ? Relevant Orders  ? EKG 12-Lead (Completed)  ? ?  ?  ? ?Follow up plan: ?Return As scheduled. ? ? ? ? ? ?

## 2021-05-12 NOTE — Telephone Encounter (Signed)
?  Chief Complaint: Intermittent chest pain ?Symptoms: dull pains last 30 seconds or so ?Frequency: days ?Pertinent Negatives: Patient denies SOB,  ?Disposition: '[]'$ ED /'[]'$ Urgent Care (no appt availability in office) / '[x]'$ Appointment(In office/virtual)/ '[]'$  [facility]/ '[]'$ Home Care/ '[]'$ Refused Recommended Disposition /'[]'$ [facility]/ '[]'$  Follow-up with PCP ?Additional Notes: Pt states he has been having dull intermittent chest pain for days. Pain is 4-5/10.  ? ?  ?Reason for Disposition ? [1] Chest pain lasts < 5 minutes AND [2] NO chest pain or cardiac symptoms (e.g., breathing difficulty, sweating) now (Exception: chest pains that last only a few seconds) ? ?Answer Assessment - Initial Assessment Questions ?1. LOCATION: "Where does it hurt?"   ?    Chest ?2. RADIATION: "Does the pain go anywhere else?" (e.g., into neck, jaw, arms, back) ?    no ?3. ONSET: "When did the chest pain begin?" (Minutes, hours or days)  ?    Days ago ?4. PATTERN "Does the pain come and go, or has it been constant since it started?"  "Does it get worse with exertion?"  ?    Comes and goes ?5. DURATION: "How long does it last" (e.g., seconds, minutes, hours) ?    A few seconds to 30 sec ?6. SEVERITY: "How bad is the pain?"  (e.g., Scale 1-10; mild, moderate, or severe) ?   - MILD (1-3): doesn't interfere with normal activities  ?   - MODERATE (4-7): interferes with normal activities or awakens from sleep ?   - SEVERE (8-10): excruciating pain, unable to do any normal activities   ?    3-5/10 ?7. CARDIAC RISK FACTORS: "Do you have any history of heart problems or risk factors for heart disease?" (e.g., angina, prior heart attack; diabetes, high blood pressure, high cholesterol, smoker, or strong family history of heart disease) ?    no ?8. PULMONARY RISK FACTORS: "Do you have any history of lung disease?"  (e.g., blood clots in lung, asthma, emphysema, birth control pills) ?    no ?9. CAUSE: "What do you think is causing  the chest pain?" ?    Bronchitis ?10. OTHER SYMPTOMS: "Do you have any other symptoms?" (e.g., dizziness, nausea, vomiting, sweating, fever, difficulty breathing, cough) ?      none ?11. PREGNANCY: "Is there any chance you are pregnant?" "When was your last menstrual period?" ?      na ? ?Protocols used: Chest Pain-A-AH ? ?

## 2021-05-13 NOTE — Progress Notes (Signed)
Interpreted by me on 05/12/21. NSR at 75bpm, no ST segment changes.

## 2021-05-14 ENCOUNTER — Other Ambulatory Visit: Payer: Self-pay

## 2021-05-14 ENCOUNTER — Emergency Department: Payer: Medicare Other

## 2021-05-14 ENCOUNTER — Emergency Department
Admission: EM | Admit: 2021-05-14 | Discharge: 2021-05-14 | Disposition: A | Discharge status: Home / Self Care | Payer: Medicare Other | Attending: Emergency Medicine | Admitting: Emergency Medicine

## 2021-05-14 ENCOUNTER — Encounter: Payer: Self-pay | Admitting: Emergency Medicine

## 2021-05-14 DIAGNOSIS — E119 Type 2 diabetes mellitus without complications: Secondary | ICD-10-CM | POA: Insufficient documentation

## 2021-05-14 DIAGNOSIS — R600 Localized edema: Secondary | ICD-10-CM | POA: Insufficient documentation

## 2021-05-14 DIAGNOSIS — R079 Chest pain, unspecified: Secondary | ICD-10-CM | POA: Diagnosis not present

## 2021-05-14 DIAGNOSIS — I10 Essential (primary) hypertension: Secondary | ICD-10-CM | POA: Diagnosis not present

## 2021-05-14 DIAGNOSIS — J811 Chronic pulmonary edema: Secondary | ICD-10-CM | POA: Diagnosis not present

## 2021-05-14 DIAGNOSIS — R0789 Other chest pain: Secondary | ICD-10-CM | POA: Diagnosis not present

## 2021-05-14 LAB — COMPREHENSIVE METABOLIC PANEL
ALT: 15 U/L (ref 0–44)
AST: 19 U/L (ref 15–41)
Albumin: 3.7 g/dL (ref 3.5–5.0)
Alkaline Phosphatase: 59 U/L (ref 38–126)
Anion gap: 6 (ref 5–15)
BUN: 18 mg/dL (ref 8–23)
CO2: 27 mmol/L (ref 22–32)
Calcium: 8.8 mg/dL — ABNORMAL LOW (ref 8.9–10.3)
Chloride: 107 mmol/L (ref 98–111)
Creatinine, Ser: 1.01 mg/dL (ref 0.61–1.24)
GFR, Estimated: >60 mL/min (ref >60)
Glucose, Bld: 113 mg/dL — ABNORMAL HIGH (ref 70–99)
Potassium: 3.9 mmol/L (ref 3.5–5.1)
Sodium: 140 mmol/L (ref 135–145)
Total Bilirubin: 1 mg/dL (ref 0.3–1.2)
Total Protein: 6.8 g/dL (ref 6.5–8.1)

## 2021-05-14 LAB — CBC WITH DIFFERENTIAL/PLATELET
Abs Immature Granulocytes: 0.01 10*3/uL (ref 0.00–0.07)
Basophils Absolute: 0 10*3/uL (ref 0.0–0.1)
Basophils Relative: 1 %
Eosinophils Absolute: 0.2 10*3/uL (ref 0.0–0.5)
Eosinophils Relative: 5 %
HCT: 42.1 % (ref 39.0–52.0)
Hemoglobin: 14 g/dL (ref 13.0–17.0)
Immature Granulocytes: 0 %
Lymphocytes Relative: 36 %
Lymphs Abs: 1.5 10*3/uL (ref 0.7–4.0)
MCH: 31.4 pg (ref 26.0–34.0)
MCHC: 33.3 g/dL (ref 30.0–36.0)
MCV: 94.4 fL (ref 80.0–100.0)
Monocytes Absolute: 0.5 10*3/uL (ref 0.1–1.0)
Monocytes Relative: 13 %
Neutro Abs: 1.9 10*3/uL (ref 1.7–7.7)
Neutrophils Relative %: 45 %
Platelets: 239 10*3/uL (ref 150–400)
RBC: 4.46 MIL/uL (ref 4.22–5.81)
RDW: 13.2 % (ref 11.5–15.5)
WBC: 4.1 10*3/uL (ref 4.0–10.5)
nRBC: 0 % (ref 0.0–0.2)

## 2021-05-14 LAB — LIPASE, BLOOD: Lipase: 39 U/L (ref 11–51)

## 2021-05-14 LAB — BRAIN NATRIURETIC PEPTIDE: B Natriuretic Peptide: 20.9 pg/mL (ref 0.0–100.0)

## 2021-05-14 LAB — TROPONIN I (HIGH SENSITIVITY)
Troponin I (High Sensitivity): 6 ng/L (ref <18)
Troponin I (High Sensitivity): 6 ng/L (ref <18)
Troponin I (High Sensitivity): 6 ng/L (ref <18)

## 2021-05-14 NOTE — ED Provider Notes (Signed)
? ?[facility] ?Provider Note ? ? ? Event Date/Time  ? First MD Initiated Contact with Patient 05/14/21 [phone]   ?  (approximate) ? ? ?History  ? ?Chest Pain ? ? ?HPI ? ?Derek Wright is a 76 y.o. male  arrhythmia, atherosclerosis of the aorta, Hypertension, HLD, diabetes mellitus type 2, OSA (not on CPAP) and obesity who comes in for chest pain.  I reviewed patient's cardiology note from 2/21.  It is noted that patient has sinus arrhythmia on his EKG but otherwise no ischemic changes.  I then reviewed a note from 3/30 where patient went in for chest pain and had an EKG done that was reassuring and just increased his omeprazole was going to follow-up outpatient with cardiology. ? ?Patient reports that since Tuesday he has had some intermittent chest pain episodes that can happen at random times it is a dull ache last a few seconds and then goes away.  He states that he went to his primary care doctor and got told it was probably acid reflux and he went up on his acid reducer to once in the morning and once at nighttime.  He states that when he has these chest pain episodes he denies any associated symptoms such as shortness of breath, nausea.  He states however this morning around 6:30 AM he developed some chest pain that was a dull sensation again but this time radiated up into the left side of his neck and into his left shoulder.  This time it lasted 30 minutes which had him more concerned and therefore he came to the emergency room.  He denies any chest pain at this time.  He denies any previous catheterizations.  Patient does follow with Dr. Clayborn Bigness and has an appointment for Monday.  Currently patient denies any symptoms. ?  ? ? ?Physical Exam  ? ?Triage Vital Signs: ?ED Triage Vitals [05/14/21 0743]  ?Enc Vitals Group  ?   BP   ?   Pulse   ?   Resp   ?   Temp   ?   Temp src   ?   SpO2   ?   Weight 273 lb 5.9 oz (124 kg)  ?   Height '6\' 2"'$  (1.88 m)  ?   Head Circumference   ?   Peak Flow   ?    Pain Score 5  ?   Pain Loc   ?   Pain Edu?   ?   Excl. in [place]?   ? ? ?Most recent vital signs: ?Vitals:  ? 05/14/21 0748  ?BP: (!) 142/87  ?Pulse: 72  ?Resp: 16  ?Temp: 98.1 ?F (36.7 ?C)  ?SpO2: 99%  ? ? ? ?General: Awake, no distress.  ?CV:  Good peripheral perfusion.  Equal radial pulses ?Resp:  Normal effort.  Clear lungs ?Abd:  No distention.  Nontender ?Other:  1+ edema in his bilateral legs.  Sensation and strength intact in his arms and legs.  Cranial nerves appear intact ? ? ?ED Results / Procedures / Treatments  ? ?Labs ?(all labs ordered are listed, but only abnormal results are displayed) ?Labs Reviewed  ?COMPREHENSIVE METABOLIC PANEL - Abnormal; Notable for the following components:  ?    Result Value  ? Glucose, Bld 113 (*)   ? Calcium 8.8 (*)   ? All other components within normal limits  ?LIPASE, BLOOD  ?BRAIN NATRIURETIC PEPTIDE  ?CBC WITH DIFFERENTIAL/PLATELET  ?TROPONIN I (HIGH SENSITIVITY)  ?TROPONIN I (  HIGH SENSITIVITY)  ?TROPONIN I (HIGH SENSITIVITY)  ? ? ? ?EKG ? ?My interpretation of EKG: ? ?Normal sinus rate of 71 without any ST elevation, type I AV block, , no t wave inversions ? ?RADIOLOGY ?I have reviewed the xray personally and see no evidence of pneumonia. ? ?IMPRESSION: ?Low volume film with pulmonary vascular congestion. ?  ? ?PROCEDURES: ? ?Critical Care performed: No ? ?.1-3 Lead EKG Interpretation ?Performed by: Vanessa [place], MD ?Authorized by: Vanessa [place], MD  ? ?  Interpretation: normal   ?  ECG rate:  70 ?  ECG rate assessment: normal   ?  Rhythm: sinus rhythm   ?  Ectopy: none   ?  Conduction: abnormal   ?  Abnormal conduction: 1st degree AV block   ? ? ?MEDICATIONS ORDERED IN ED: ?Medications - No data to display ? ? ?IMPRESSION / MDM / ASSESSMENT AND PLAN / ED COURSE  ?I reviewed the triage vital signs and the nursing notes. ?             ?               ? ?Patient has risk factors for coronary disease although no history of stent placement who comes in with chest pain.   This concerning for ACS.  No abdominal pain to suggest cholecystitis.  He does have a little bit of swelling in his legs but it sounds that this could be baseline for him.  We will add on BNP to evaluate for heart failure.  Chest x-ray to evaluate for any pneumothorax.  Does not sound like a dissection given chest pain is now resolved.  No shortness of breath to suggest pulmonary embolism. ? ?CBC no anemia.  Initial troponin negative.  CMP negative.  Lipase normal.  BNP normal ? ? ?9:16 AM reevaluated patient.  He continues not have any chest pain.  We discussed that even if troponins are negative does not mean he does not have heart disease and that he has recurrent chest pain he would need to return to the ER.  We discussed admission given his risk factors versus following up outpatient with cardiology on Monday.  He stated that he felt comfortable following up outpatient with cardiology given he is got appointment at 8 AM on Monday but he understands the importance that if chest pain returns he would need to return to the ER for repeat troponins and that I cannot predict future heart attacks. ? ?10:46 AM repeat troponin was negative but I noted that the time was listed as the same time.  I called the lab to confirm that this was the repeat ordered that was drawn and not the old blood.  The nurse Charlett Nose did confirm that she sent on a second tube.  When I talked to Sam in the lab she also confirmed that the second  tube was in the machine and the original tube was still in refrigerator.  She was 100% sure that they ran the new blood and not the old blood twice.  She states that she was in [place] where it was collected at 948 and resulted at 953.  Unfortunately in epic it has not crossed over and still appears that there at the same time but she is adamant that this was the new blood and they fixed the time on what they could in [place].  Just to ensure that patient does not have rising troponin we will get a  repeat. ? ?  11:27 AM reevaluated patient continues to not have any chest pain for over 3 hours since he has been here.  Repeat troponin is confirmed to be negative.  Patient's been tolerating p.o. without any recurrent symptoms.  Again discussed with patient at this time he feels comfortable with discharge home and will follow-up outpatient with cardiology but does express understanding that if the pain returns that he needs to return to the ER for repeat evaluation ? ?I discussed the provisional nature of ED diagnosis, the treatment so far, the ongoing plan of care, follow up appointments and return precautions with the patient and any family or support people present. They expressed understanding and agreed with the plan, discharged home. ? ? ?The patient is on the cardiac monitor to evaluate for evidence of arrhythmia and/or significant heart rate changes. ? ?FINAL CLINICAL IMPRESSION(S) / ED DIAGNOSES  ? ?Final diagnoses:  ?Chest pain, unspecified type  ? ? ? ?Rx / DC Orders  ? ?ED Discharge Orders   ? ? None  ? ?  ? ? ? ?Note:  This document was prepared using Dragon voice recognition software and may include unintentional dictation errors. ?  ?Vanessa [place], MD ?05/14/21 1136 ? ?

## 2021-05-14 NOTE — ED Notes (Signed)
Pt ambulatory to restroom

## 2021-05-14 NOTE — Discharge Instructions (Signed)
Your work-up was negative for an active heart attack but your story is concerning you have risk factors for heart disease therefore we discussed coming into the hospital versus following up outpatient with cardiology.  Since you have an appointment on Monday I think it is reasonable to follow-up with them to decide if he needs stress test or catheterization however if you have recurrent chest pain prior to then you need to return to the ER for repeat evaluation to make sure that a heart attack is not evolving or return for any other concerns ? ?

## 2021-05-14 NOTE — ED Triage Notes (Signed)
Pt reports Tuesday started with some CP that he describes as aching in nature. Pt reports went to his MD Thursday and had an EKG and they said everything was fine. Pt reports this AM the pain started again and this time it radiates into his left shoulder and neck. Pt denies SOB, dizziness or other sx's.  ?

## 2021-05-14 NOTE — ED Notes (Signed)
See triage note, pt reports centralized chest pain radiates to left arm and left side neck that started this morning. Reports intermittent chest pain since Tuesday, was dx with GERD. RR even and unlabored. NAD noted ?

## 2021-05-14 NOTE — ED Notes (Signed)
Pt ambulatory to restroom. Provided juice and crackers per request, ok per Dr Jari Pigg ?

## 2021-05-16 ENCOUNTER — Other Ambulatory Visit
Admission: RE | Admit: 2021-05-16 | Discharge: 2021-05-16 | Disposition: A | Discharge status: Home / Self Care | Payer: Medicare Other | Source: Ambulatory Visit | Attending: Internal Medicine | Admitting: Internal Medicine

## 2021-05-16 ENCOUNTER — Telehealth: Payer: Self-pay | Admitting: *Deleted

## 2021-05-16 DIAGNOSIS — R0602 Shortness of breath: Secondary | ICD-10-CM | POA: Insufficient documentation

## 2021-05-16 DIAGNOSIS — Z01818 Encounter for other preprocedural examination: Secondary | ICD-10-CM | POA: Insufficient documentation

## 2021-05-16 DIAGNOSIS — I509 Heart failure, unspecified: Secondary | ICD-10-CM | POA: Insufficient documentation

## 2021-05-16 DIAGNOSIS — Z5181 Encounter for therapeutic drug level monitoring: Secondary | ICD-10-CM | POA: Diagnosis not present

## 2021-05-16 DIAGNOSIS — Z79899 Other long term (current) drug therapy: Secondary | ICD-10-CM | POA: Insufficient documentation

## 2021-05-16 LAB — BRAIN NATRIURETIC PEPTIDE: B Natriuretic Peptide: 29.7 pg/mL (ref 0.0–100.0)

## 2021-05-16 NOTE — Telephone Encounter (Signed)
Transition Care Management Unsuccessful Follow-up Telephone Call ? ?Date of discharge and from where:   [facility] ? ?Attempts:  1st Attempt ? ?Reason for unsuccessful TCM follow-up call:  No answer/busy ? ?  ?

## 2021-05-17 NOTE — Telephone Encounter (Signed)
Transition Care Management Unsuccessful Follow-up Telephone Call ? ?Date of discharge and from where:  [facility] ? ?Attempts:  2nd Attempt ? ?Reason for unsuccessful TCM follow-up call:    spoke with wife patient was not home.  ? ?  ?

## 2021-05-18 NOTE — Telephone Encounter (Signed)
Transition Care Management Follow-up Telephone Call ?Date of discharge and from where: [place]  ?How have you been since you were released from the hospital? Feeling  ?Any questions or concerns? No ? ?Items Reviewed: ?Did the pt receive and understand the discharge instructions provided? Yes  ?Medications obtained and verified? Yes  ?Other? No  ?Any new allergies since your discharge? No  ?Dietary orders reviewed? No ?Do you have support at home? Yes  ? ?Home Care and Equipment/Supplies: ?Were home health services ordered? not applicable ?If so, what is the name of the agency?   ?Has the agency set up a time to come to the patient's home?  ?Were any new equipment or medical supplies ordered?   ?What is the name of the medical supply agency?  ?Were you able to get the supplies/equipment?  ?Do you have any questions related to the use of the equipment or supplies?  ? ?Functional Questionnaire: (I = Independent and D = Dependent) ?ADLs: I ? ?Bathing/Dressing- I ? ?Meal Prep- I ? ?Eating- I ? ?Maintaining continence- I ? ?Transferring/Ambulation- I ? ?Managing Meds- I ? ?Follow up appointments reviewed: ? ?PCP Hospital f/u appt confirmed? No  Patient has a scheduled procedure Monday left heart cath ?[facility] f/u appt confirmed? Yes  Scheduled to see 05-23-2021 on [place]  ?Are transportation arrangements needed? No  ?If their condition worsens, is the pt aware to call PCP or go to the Emergency Dept.? Yes ?Was the patient provided with contact information for the PCP's office or ED? Yes ?Was to pt encouraged to call back with questions or concerns? Yes  ?

## 2021-05-23 ENCOUNTER — Encounter: Admission: RE | Payer: Self-pay | Source: Home / Self Care

## 2021-05-23 ENCOUNTER — Ambulatory Visit: Admission: RE | Admit: 2021-05-23 | Payer: Medicare Other | Source: Home / Self Care | Admitting: Internal Medicine

## 2021-05-23 DIAGNOSIS — I200 Unstable angina: Secondary | ICD-10-CM

## 2021-05-23 SURGERY — LEFT HEART CATH AND CORONARY ANGIOGRAPHY
Anesthesia: Moderate Sedation

## 2021-06-06 DIAGNOSIS — I208 Other forms of angina pectoris: Secondary | ICD-10-CM | POA: Diagnosis not present

## 2021-06-06 DIAGNOSIS — R0602 Shortness of breath: Secondary | ICD-10-CM | POA: Diagnosis not present

## 2021-06-17 DIAGNOSIS — R0602 Shortness of breath: Secondary | ICD-10-CM | POA: Diagnosis not present

## 2021-06-17 DIAGNOSIS — I208 Other forms of angina pectoris: Secondary | ICD-10-CM | POA: Diagnosis not present

## 2021-06-18 NOTE — Patient Instructions (Signed)

## 2021-06-20 ENCOUNTER — Ambulatory Visit (INDEPENDENT_AMBULATORY_CARE_PROVIDER_SITE_OTHER): Payer: Medicare Other | Admitting: Nurse Practitioner

## 2021-06-20 ENCOUNTER — Encounter: Payer: Self-pay | Admitting: Nurse Practitioner

## 2021-06-20 DIAGNOSIS — I10 Essential (primary) hypertension: Secondary | ICD-10-CM | POA: Diagnosis not present

## 2021-06-20 DIAGNOSIS — I700 Atherosclerosis of aorta: Secondary | ICD-10-CM

## 2021-06-20 DIAGNOSIS — E782 Mixed hyperlipidemia: Secondary | ICD-10-CM

## 2021-06-20 DIAGNOSIS — L409 Psoriasis, unspecified: Secondary | ICD-10-CM | POA: Insufficient documentation

## 2021-06-20 DIAGNOSIS — R7309 Other abnormal glucose: Secondary | ICD-10-CM

## 2021-06-20 LAB — MICROALBUMIN, URINE WAIVED
Creatinine, Urine Waived: 200 mg/dL (ref 10–300)
Microalb, Ur Waived: 10 mg/L (ref 0–19)
Microalb/Creat Ratio: <30 mg/g (ref <30)

## 2021-06-20 LAB — BAYER DCA HB A1C WAIVED: HB A1C (BAYER DCA - WAIVED): 6 % — ABNORMAL HIGH (ref 4.8–5.6)

## 2021-06-20 MED ORDER — CLOBETASOL PROPIONATE 0.05 % EX CREA: 1.0000 "application " | TOPICAL_CREAM | Freq: Two times a day (BID) | CUTANEOUS | 4 refills | Status: DC

## 2021-06-20 NOTE — Assessment & Plan Note (Signed)
Chronic, stable with BP below goal.  Continue current medication regimen + collaboration with cardiology and recommend checking BP daily at home and documenting + focus on DASH diet.  Labs today: lipid and urine ALB.  Urine ALB 12 January 2022, could consider adding ARB (no ACE, previous smoker) in future for BP and proteinuria.  Return in 6 months. ?

## 2021-06-20 NOTE — Assessment & Plan Note (Signed)
BMI 35.05. Recommended eating smaller high protein, low fat meals more frequently and exercising 30 mins a day 5 times a week with a goal of 10-15lb weight loss in the next 3 months. Patient voiced their understanding and motivation to adhere to these recommendations. ? ? ?

## 2021-06-20 NOTE — Assessment & Plan Note (Signed)
Chronic, ongoing.  Continue daily statin and adjust dose as needed. Recommend he take a daily ASA 81 MG for prevention.  Return in 6 months.  Lipid panel today. ?

## 2021-06-20 NOTE — Assessment & Plan Note (Signed)
Noted on CT 08/17/17, recommend continued use of statin daily and adding a daily Baby ASA 81 MG for prevention.  Recommend continued cessation of smoking, he quit in 1982. ?

## 2021-06-20 NOTE — Assessment & Plan Note (Signed)
A1c trending down 6% last visit, continue diet focus at home.  Recheck today. ?

## 2021-06-20 NOTE — Progress Notes (Signed)
? ?BP 117/78   Pulse 98   Temp 97.8 ?F (36.6 ?C) (Oral)   Wt 273 lb (123.8 kg)   SpO2 98%   BMI 35.05 kg/m?   ? ?Subjective:  ? ? [name], male    DOB: 03/25/1945, 76 y.o.   MRN: [id number] ? ?HPI: ?Derek Wright is a 76 y.o. male ? ?Chief Complaint  ?Patient presents with  ? Hyperlipidemia  ? Hypertension  ? ?HYPERTENSION / HYPERLIPIDEMIA ?Continues on Metoprolol XL 25 MG daily as needed and Atorvastatin 40 MG daily. Saw cardiology 05/16/21, has been following closely with them due to chest pain episode on April 1st.  Since 05/14/21 he has had a few CP episodes that felt like indigestion, but nothing like on initial presentation.  Continue to take Protonix for heart burn.   ? ?He had MYO on 06/06/21 with LVEF 51%.  Returns to see Dr. Clayborn Bigness on Wednesday this week to see if catheterization necessary. He does endorse stressors at home as is main caregiver, wife with multiple illnesses. ?Satisfied with current treatment? yes ?Duration of hypertension: chronic ?BP monitoring frequency: not checking ?BP range:  ?BP medication side effects: no ?Duration of hyperlipidemia: chronic ?Cholesterol medication side effects: no ?Cholesterol supplements: none ?Medication compliance: good compliance ?Aspirin: no ?Recent stressors: no ?Recurrent headaches: no ?Visual changes: no ?Palpitations: no ?Dyspnea: no ?Chest pain: no ?Lower extremity edema: no ?Dizzy/lightheaded: no  ?The 10-year ASCVD risk score (Arnett DK, et al., 2019) is: 27.6% ?  Values used to calculate the score: ?    Age: 89 years ?    Sex: Male ?    Is Non-Hispanic African American: Yes ?    Diabetic: Yes ?    Tobacco smoker: No ?    Systolic Blood Pressure: 485 mmHg ?    Is BP treated: Yes ?    HDL Cholesterol: 57 mg/dL ?    Total Cholesterol: 139 mg/dL ? ?PREDIABETES ?Last A1c 6% November.  Is focused on diet. ?Polydipsia/polyuria: no ?Visual disturbance: no ?Chest pain: no ?Paresthesias: no ? ?PSORIASIS ?Ongoing issue, that has improved in past  with a pink lotion.  Has some to back now and remains left lower leg. ?Duration: months ?Location: back and left lower leg ?Painful: no ?Itching: yes ?Onset: gradual ?Context: not changing ?Associated signs and symptoms: itching and scaling ?History of skin cancer: no ?History of precancerous skin lesions: no ?Family history of skin cancer: no  ? ?Relevant past medical, surgical, family and social history reviewed and updated as indicated. Interim medical history since our last visit reviewed. ?Allergies and medications reviewed and updated. ? ?Review of Systems  ?Constitutional:  Negative for activity change, diaphoresis, fatigue and fever.  ?Respiratory:  Negative for cough, chest tightness, shortness of breath and wheezing.   ?Cardiovascular:  Negative for chest pain, palpitations and leg swelling.  ?Gastrointestinal: Negative.   ?Endocrine: Negative for polydipsia, polyphagia and polyuria.  ?Neurological: Negative.   ?Psychiatric/Behavioral: Negative.    ? ?Per HPI unless specifically indicated above ? ?   ?Objective:  ?  ?BP 117/78   Pulse 98   Temp 97.8 ?F (36.6 ?C) (Oral)   Wt 273 lb (123.8 kg)   SpO2 98%   BMI 35.05 kg/m?   ?Wt Readings from Last 3 Encounters:  ?06/20/21 273 lb (123.8 kg)  ?05/14/21 273 lb 5.9 oz (124 kg)  ?05/12/21 273 lb (123.8 kg)  ?  ?Physical Exam ?Vitals and nursing note reviewed.  ?Constitutional:   ?  General: He is awake. He is not in acute distress. ?   Appearance: He is well-developed. He is obese. He is not ill-appearing.  ?HENT:  ?   Head: Normocephalic and atraumatic.  ?   Right Ear: Hearing normal. No drainage.  ?   Left Ear: Hearing normal. No drainage.  ?Eyes:  ?   General: Lids are normal.     ?   Right eye: No discharge.     ?   Left eye: No discharge.  ?   Conjunctiva/sclera: Conjunctivae normal.  ?   Pupils: Pupils are equal, round, and reactive to light.  ?Neck:  ?   Thyroid: No thyromegaly.  ?   Vascular: No carotid bruit.  ?Cardiovascular:  ?   Rate and Rhythm:  Normal rate and regular rhythm.  ?   Heart sounds: Normal heart sounds, S1 normal and S2 normal. No murmur heard. ?  No gallop.  ?Pulmonary:  ?   Effort: Pulmonary effort is normal. No accessory muscle usage or respiratory distress.  ?   Breath sounds: Normal breath sounds.  ?Abdominal:  ?   General: Bowel sounds are normal.  ?   Palpations: Abdomen is soft.  ?Musculoskeletal:     ?   General: Normal range of motion.  ?   Cervical back: Normal range of motion and neck supple.  ?   Right lower leg: No edema.  ?   Left lower leg: No edema.  ?Skin: ?   General: Skin is warm and dry.  ?   Capillary Refill: Capillary refill takes less than 2 seconds.  ?   Findings: Rash present.  ?   Comments: Approx. 6 cm oval patch of scaling rash to left lateral lower leg with skin intact.  Scaling patches to posterior neck and back noted.  ?Neurological:  ?   Mental Status: He is alert and oriented to person, place, and time.  ?Psychiatric:     ?   Attention and Perception: Attention normal.     ?   Mood and Affect: Mood normal.     ?   Behavior: Behavior normal. Behavior is cooperative.     ?   Thought Content: Thought content normal.  ? ?Results for orders placed or performed during the hospital encounter of 05/16/21  ?Brain natriuretic peptide  ?Result Value Ref Range  ? B Natriuretic Peptide 29.7 0.0 - 100.0 pg/mL  ? ?   ?Assessment & Plan:  ? ?Problem List Items Addressed This Visit   ? ?  ? Cardiovascular and Mediastinum  ? Atherosclerosis of aorta ([place])  ?  Noted on CT 08/17/17, recommend continued use of statin daily and adding a daily Baby ASA 81 MG for prevention.  Recommend continued cessation of smoking, he quit in 1982. ?  ?  ? HTN (hypertension)  ?  Chronic, stable with BP below goal.  Continue current medication regimen + collaboration with cardiology and recommend checking BP daily at home and documenting + focus on DASH diet.  Labs today: lipid and urine ALB.  Urine ALB 12 January 2022, could consider adding ARB (no  ACE, previous smoker) in future for BP and proteinuria.  Return in 6 months. ? ?  ?  ?  ? Musculoskeletal and Integument  ? Psoriasis  ?  Chronic, ongoing.  Cost is concern in regard to treatment.  Will send in Clobetasol and to alert provider if this is too costly and will trial alternate.  Would benefit from dermatology referral  in future. ? ?  ?  ?  ? Other  ? Elevated hemoglobin A1c  ?  A1c trending down 6% last visit, continue diet focus at home.  Recheck today. ? ?  ?  ? Relevant Orders  ? Bayer DCA Hb A1c Waived  ? Microalbumin, Urine Waived  ? Hyperlipidemia  ?  Chronic, ongoing.  Continue daily statin and adjust dose as needed. Recommend he take a daily ASA 81 MG for prevention.  Return in 6 months.  Lipid panel today. ? ?  ?  ? Relevant Orders  ? Lipid Panel w/o Chol/HDL Ratio  ? Morbid obesity ([place]) - Primary  ?  BMI 35.05. Recommended eating smaller high protein, low fat meals more frequently and exercising 30 mins a day 5 times a week with a goal of 10-15lb weight loss in the next 3 months. Patient voiced their understanding and motivation to adhere to these recommendations. ? ? ? ?  ?  ?  ? ?Follow up plan: ?Return in about 6 months (around 12/21/2021) for Annual physical with labs. ?

## 2021-06-20 NOTE — Addendum Note (Signed)
Addended by: Irena Reichmann on: 06/20/2021 09:15 AM ? ? Modules accepted: Orders ? ?

## 2021-06-20 NOTE — Assessment & Plan Note (Signed)
Chronic, ongoing.  Cost is concern in regard to treatment.  Will send in Clobetasol and to alert provider if this is too costly and will trial alternate.  Would benefit from dermatology referral in future. ?

## 2021-06-21 LAB — LIPID PANEL W/O CHOL/HDL RATIO
Cholesterol, Total: 161 mg/dL (ref 100–199)
HDL: 60 mg/dL (ref >39)
LDL Chol Calc (NIH): 87 mg/dL (ref 0–99)
Triglycerides: 71 mg/dL (ref 0–149)
VLDL Cholesterol Cal: 14 mg/dL (ref 5–40)

## 2021-06-21 NOTE — Progress Notes (Signed)
Good morning crew, please let Derek Wright know his cholesterol levels have returned and overall remain stable.  Keep up the good work!!  No changes needed.  Any questions? ?Keep being amazing!!  Thank you for allowing me to participate in your care.  I appreciate you. ?Kindest regards, ?Bernal Luhman ?

## 2021-06-22 ENCOUNTER — Other Ambulatory Visit
Admission: RE | Admit: 2021-06-22 | Discharge: 2021-06-22 | Disposition: A | Discharge status: Home / Self Care | Payer: Medicare Other | Source: Ambulatory Visit | Attending: Internal Medicine | Admitting: Internal Medicine

## 2021-06-22 DIAGNOSIS — Z01818 Encounter for other preprocedural examination: Secondary | ICD-10-CM | POA: Insufficient documentation

## 2021-06-22 DIAGNOSIS — I208 Other forms of angina pectoris: Secondary | ICD-10-CM | POA: Insufficient documentation

## 2021-06-22 DIAGNOSIS — I10 Essential (primary) hypertension: Secondary | ICD-10-CM | POA: Insufficient documentation

## 2021-06-22 DIAGNOSIS — E782 Mixed hyperlipidemia: Secondary | ICD-10-CM | POA: Insufficient documentation

## 2021-06-22 DIAGNOSIS — I700 Atherosclerosis of aorta: Secondary | ICD-10-CM | POA: Insufficient documentation

## 2021-06-22 LAB — BRAIN NATRIURETIC PEPTIDE: B Natriuretic Peptide: 57.7 pg/mL (ref 0.0–100.0)

## 2021-07-14 DIAGNOSIS — L600 Ingrowing nail: Secondary | ICD-10-CM | POA: Diagnosis not present

## 2021-07-14 DIAGNOSIS — B351 Tinea unguium: Secondary | ICD-10-CM | POA: Diagnosis not present

## 2021-07-14 DIAGNOSIS — E119 Type 2 diabetes mellitus without complications: Secondary | ICD-10-CM | POA: Diagnosis not present

## 2021-07-14 DIAGNOSIS — M79675 Pain in left toe(s): Secondary | ICD-10-CM | POA: Diagnosis not present

## 2021-07-14 DIAGNOSIS — M79674 Pain in right toe(s): Secondary | ICD-10-CM | POA: Diagnosis not present

## 2021-07-18 ENCOUNTER — Other Ambulatory Visit: Payer: Self-pay

## 2021-07-18 ENCOUNTER — Ambulatory Visit
Admission: RE | Admit: 2021-07-18 | Discharge: 2021-07-18 | Disposition: A | Discharge status: Home / Self Care | Payer: Medicare Other | Attending: Internal Medicine | Admitting: Internal Medicine

## 2021-07-18 ENCOUNTER — Encounter
Admission: RE | Disposition: A | Discharge status: Home / Self Care | Payer: Self-pay | Source: Home / Self Care | Attending: Internal Medicine

## 2021-07-18 ENCOUNTER — Encounter: Payer: Self-pay | Admitting: Internal Medicine

## 2021-07-18 DIAGNOSIS — I2584 Coronary atherosclerosis due to calcified coronary lesion: Secondary | ICD-10-CM | POA: Diagnosis not present

## 2021-07-18 DIAGNOSIS — I200 Unstable angina: Secondary | ICD-10-CM | POA: Diagnosis not present

## 2021-07-18 DIAGNOSIS — I25110 Atherosclerotic heart disease of native coronary artery with unstable angina pectoris: Secondary | ICD-10-CM | POA: Diagnosis not present

## 2021-07-18 DIAGNOSIS — R9430 Abnormal result of cardiovascular function study, unspecified: Secondary | ICD-10-CM | POA: Diagnosis not present

## 2021-07-18 HISTORY — DX: Other specified postprocedural states: Z98.890

## 2021-07-18 HISTORY — PX: LEFT HEART CATH AND CORONARY ANGIOGRAPHY: CATH118249

## 2021-07-18 SURGERY — LEFT HEART CATH AND CORONARY ANGIOGRAPHY
Anesthesia: Moderate Sedation | Laterality: Left

## 2021-07-18 MED ORDER — ONDANSETRON HCL 4 MG/2ML IJ SOLN: 4.0000 mg | Freq: Four times a day (QID) | INTRAMUSCULAR | Status: DC | PRN

## 2021-07-18 MED ORDER — HEPARIN (PORCINE) IN NACL 1000-0.9 UT/500ML-% IV SOLN
INTRAVENOUS | Status: DC | PRN
  Administered 2021-07-18 (×2): 500 mL

## 2021-07-18 MED ORDER — SODIUM CHLORIDE 0.9% FLUSH
3.0000 mL | Freq: Two times a day (BID) | INTRAVENOUS | Status: DC
Start: 2021-07-18 — End: 2021-07-19

## 2021-07-18 MED ORDER — HEPARIN (PORCINE) IN NACL 1000-0.9 UT/500ML-% IV SOLN
INTRAVENOUS | Status: AC
  Filled 2021-07-18: qty 1000

## 2021-07-18 MED ORDER — SODIUM CHLORIDE 0.9 % IV SOLN: 250.0000 mL | INTRAVENOUS | Status: DC | PRN

## 2021-07-18 MED ORDER — FENTANYL CITRATE (PF) 100 MCG/2ML IJ SOLN
INTRAMUSCULAR | Status: AC
  Filled 2021-07-18: qty 2

## 2021-07-18 MED ORDER — ASPIRIN 81 MG PO CHEW: 81.0000 mg | CHEWABLE_TABLET | ORAL | Status: DC

## 2021-07-18 MED ORDER — LIDOCAINE HCL 1 % IJ SOLN
INTRAMUSCULAR | Status: AC
  Filled 2021-07-18: qty 20

## 2021-07-18 MED ORDER — SODIUM CHLORIDE 0.9% FLUSH: 3.0000 mL | INTRAVENOUS | Status: DC | PRN

## 2021-07-18 MED ORDER — VERAPAMIL HCL 2.5 MG/ML IV SOLN
INTRAVENOUS | Status: AC
  Filled 2021-07-18: qty 2

## 2021-07-18 MED ORDER — HEPARIN SODIUM (PORCINE) 1000 UNIT/ML IJ SOLN
INTRAMUSCULAR | Status: DC | PRN
  Administered 2021-07-18: 6000 [IU] via INTRAVENOUS

## 2021-07-18 MED ORDER — SODIUM CHLORIDE 0.9 % WEIGHT BASED INFUSION
1.0000 mL/kg/h | INTRAVENOUS | Status: DC
  Administered 2021-07-18: 1 mL/kg/h via INTRAVENOUS

## 2021-07-18 MED ORDER — LIDOCAINE HCL (PF) 1 % IJ SOLN
INTRAMUSCULAR | Status: DC | PRN
  Administered 2021-07-18: 2 mL

## 2021-07-18 MED ORDER — LABETALOL HCL 5 MG/ML IV SOLN: 10.0000 mg | INTRAVENOUS | Status: DC | PRN

## 2021-07-18 MED ORDER — MIDAZOLAM HCL 2 MG/2ML IJ SOLN
INTRAMUSCULAR | Status: DC | PRN
  Administered 2021-07-18: 1 mg via INTRAVENOUS

## 2021-07-18 MED ORDER — IOHEXOL 300 MG/ML  SOLN
INTRAMUSCULAR | Status: DC | PRN
  Administered 2021-07-18: 142 mL

## 2021-07-18 MED ORDER — HYDRALAZINE HCL 20 MG/ML IJ SOLN: 10.0000 mg | INTRAMUSCULAR | Status: DC | PRN

## 2021-07-18 MED ORDER — SODIUM CHLORIDE 0.9 % WEIGHT BASED INFUSION: 3.0000 mL/kg/h | INTRAVENOUS | Status: AC

## 2021-07-18 MED ORDER — SODIUM CHLORIDE 0.9% FLUSH: 3.0000 mL | Freq: Two times a day (BID) | INTRAVENOUS | Status: DC

## 2021-07-18 MED ORDER — MIDAZOLAM HCL 2 MG/2ML IJ SOLN
INTRAMUSCULAR | Status: AC
  Filled 2021-07-18: qty 2

## 2021-07-18 MED ORDER — SODIUM CHLORIDE 0.9 % WEIGHT BASED INFUSION: 1.0000 mL/kg/h | INTRAVENOUS | Status: DC

## 2021-07-18 MED ORDER — HEPARIN SODIUM (PORCINE) 1000 UNIT/ML IJ SOLN
INTRAMUSCULAR | Status: AC
  Filled 2021-07-18: qty 10

## 2021-07-18 MED ORDER — FENTANYL CITRATE (PF) 100 MCG/2ML IJ SOLN
INTRAMUSCULAR | Status: DC | PRN
  Administered 2021-07-18: 25 ug via INTRAVENOUS

## 2021-07-18 MED ORDER — ACETAMINOPHEN 325 MG PO TABS: 650.0000 mg | ORAL_TABLET | ORAL | Status: DC | PRN

## 2021-07-18 MED ORDER — VERAPAMIL HCL 2.5 MG/ML IV SOLN
INTRAVENOUS | Status: DC | PRN
  Administered 2021-07-18: 2.5 mg via INTRA_ARTERIAL

## 2021-07-18 SURGICAL SUPPLY — 11 items
BAND ZEPHYR COMPRESS 30 LONG (HEMOSTASIS) ×1 IMPLANT
CATH 5FR JL3.5 JR4 ANG PIG MP (CATHETERS) ×1 IMPLANT
CATH INFINITI 5F JL4 125CM (CATHETERS) ×1 IMPLANT
DRAPE BRACHIAL (DRAPES) ×1 IMPLANT
GLIDESHEATH SLEND SS 6F .021 (SHEATH) ×1 IMPLANT
GUIDEWIRE INQWIRE 1.5J.035X260 (WIRE) IMPLANT
INQWIRE 1.5J .035X260CM (WIRE) ×2
PACK CARDIAC CATH (CUSTOM PROCEDURE TRAY) ×2 IMPLANT
PROTECTION STATION PRESSURIZED (MISCELLANEOUS) ×2
SET ATX SIMPLICITY (MISCELLANEOUS) ×1 IMPLANT
STATION PROTECTION PRESSURIZED (MISCELLANEOUS) IMPLANT

## 2021-07-18 NOTE — Discharge Instructions (Signed)
Tomorrow contact Dr Etta Quill office for follow-up appointment within one week.

## 2021-07-19 ENCOUNTER — Encounter: Payer: Self-pay | Admitting: Internal Medicine

## 2021-07-19 ENCOUNTER — Telehealth: Payer: Self-pay

## 2021-07-21 NOTE — Telephone Encounter (Signed)
error 

## 2021-07-26 DIAGNOSIS — B351 Tinea unguium: Secondary | ICD-10-CM | POA: Diagnosis not present

## 2021-07-26 DIAGNOSIS — M79674 Pain in right toe(s): Secondary | ICD-10-CM | POA: Diagnosis not present

## 2021-07-26 DIAGNOSIS — M79675 Pain in left toe(s): Secondary | ICD-10-CM | POA: Diagnosis not present

## 2021-07-26 DIAGNOSIS — E119 Type 2 diabetes mellitus without complications: Secondary | ICD-10-CM | POA: Diagnosis not present

## 2021-08-24 ENCOUNTER — Ambulatory Visit (INDEPENDENT_AMBULATORY_CARE_PROVIDER_SITE_OTHER): Payer: Medicare Other | Admitting: Nurse Practitioner

## 2021-08-24 ENCOUNTER — Encounter: Payer: Self-pay | Admitting: Nurse Practitioner

## 2021-08-24 ENCOUNTER — Ambulatory Visit: Payer: Self-pay | Admitting: *Deleted

## 2021-08-24 VITALS — BP 108/60 | HR 69 | Temp 98.5°F | Wt 270.0 lb

## 2021-08-24 DIAGNOSIS — J069 Acute upper respiratory infection, unspecified: Secondary | ICD-10-CM

## 2021-08-24 MED ORDER — HYDROCOD POLI-CHLORPHE POLI ER 10-8 MG/5ML PO SUER: 5.0000 mL | Freq: Two times a day (BID) | ORAL | 0 refills | Status: DC | PRN

## 2021-08-24 NOTE — Telephone Encounter (Signed)
  Chief Complaint: cough Symptoms: chest congestion, dizziness with cough Frequency: since 'Sunday Pertinent Negatives: Patient denies fever, SOB Disposition: []ED /[]Urgent Care (no appt availability in office) / [x]Appointment(In office/virtual)/ [] [facility]/ []Home Care/ []Refused Recommended Disposition /[] Mobile Bus/ [] Follow-up with PCP Additional Notes: recent heart procedure   Reason for Disposition  [1] Continuous (nonstop) coughing interferes with work or school AND [2] no improvement using cough treatment per Care Advice  Answer Assessment - Initial Assessment Questions 1. ONSET: "When did the cough begin?"      Started Sunday- flu like 2. SEVERITY: "How bad is the cough today?"      Chest congestion- hard cough 3. SPUTUM: "Describe the color of your sputum" (none, dry cough; clear, white, yellow, green)     White- yellow 4. HEMOPTYSIS: "Are you coughing up any blood?" If so ask: "How much?" (flecks, streaks, tablespoons, etc.)     no 5. DIFFICULTY BREATHING: "Are you having difficulty breathing?" If Yes, ask: "How bad is it?" (e.g., mild, moderate, severe)    - MILD: No SOB at rest, mild SOB with walking, speaks normally in sentences, can lie down, no retractions, pulse < 100.    - MODERATE: SOB at rest, SOB with minimal exertion and prefers to sit, cannot lie down flat, speaks in phrases, mild retractions, audible wheezing, pulse 100-120.    - SEVERE: Very SOB at rest, speaks in single words, struggling to breathe, sitting hunched forward, retractions, pulse > 120      No problem 6. FEVER: "Do you have a fever?" If Yes, ask: "What is your temperature, how was it measured, and when did it start?"     no 7. CARDIAC HISTORY: "Do you have any history of heart disease?" (e.g., heart attack, congestive heart failure)      Heart cath 8. LUNG HISTORY: "Do you have any history of lung disease?"  (e.g., pulmonary embolus, asthma, emphysema)     no 9. PE  RISK FACTORS: "Do you have a history of blood clots?" (or: recent major surgery, recent prolonged travel, bedridden)     no 10. OTHER SYMPTOMS: "Do you have any other symptoms?" (e.g., runny nose, wheezing, chest pain)       Heavy congestion, dizziness after hard cough 11. PREGNANCY: "Is there any chance you are pregnant?" "When was your last menstrual period?"         12'$ . TRAVEL: "Have you traveled out of the country in the last month?" (e.g., travel history, exposures)       No exposure  Protocols used: Cough - Acute Productive-A-AH

## 2021-08-24 NOTE — Telephone Encounter (Signed)
Summary: Pt previously had some dizziness when coughing but not at time of call   Pt scheduled appt for Monday 08/29/21 for cough cold and headache but then stated he some times experience dizziness when he coughs. Pt was not experiencing dizziness at the time of call. Pt appt added to wait list.Cb# [phone]     Call to patient- left message with patient's wife to have patient call office

## 2021-08-24 NOTE — Progress Notes (Unsigned)
   Pulse 69   Temp 98.5 F (36.9 C) (Oral)   Wt 270 lb (122.5 kg)   SpO2 99%   BMI 33.30 kg/m    Subjective:    [name], male    DOB: 1946-01-29, 76 y.o.   MRN: [id number]  HPI: Derek Wright is a 76 y.o. male  Chief Complaint  Patient presents with   Cough    Started over the weekend per patient .    UPPER RESPIRATORY TRACT INFECTION Worst symptom: Symptoms started over the weekend Fever: no Cough: yes Shortness of breath: no Wheezing: no Chest pain: yes, with cough Chest tightness: no Chest congestion: yes Nasal congestion: yes Runny nose: yes Post nasal drip: yes Sneezing: no Sore throat: no Swollen glands: no Sinus pressure: yes Headache: no Face pain: no Toothache: no Ear pain: no bilateral Ear pressure: no bilateral Eyes red/itching:no Eye drainage/crusting: no  Vomiting: no Rash: no Fatigue: no Sick contacts: no Strep contacts: no  Context: better Recurrent sinusitis: no Relief with OTC cold/cough medications: yes  Treatments attempted:  [place] and Coricidin    Relevant past medical, surgical, family and social history reviewed and updated as indicated. Interim medical history since our last visit reviewed. Allergies and medications reviewed and updated.  Review of Systems  Constitutional:  Negative for fatigue and fever.  HENT:  Positive for congestion. Negative for ear pain, postnasal drip, rhinorrhea, sinus pressure, sinus pain, sneezing and sore throat.   Respiratory:  Positive for cough. Negative for chest tightness, shortness of breath and wheezing.   Gastrointestinal:  Negative for vomiting.  Skin:  Negative for rash.  Neurological:  Negative for headaches.    Per HPI unless specifically indicated above     Objective:    Pulse 69   Temp 98.5 F (36.9 C) (Oral)   Wt 270 lb (122.5 kg)   SpO2 99%   BMI 33.30 kg/m   Wt Readings from Last 3 Encounters:  08/24/21 270 lb (122.5 kg)  07/18/21 270 lb (122.5 kg)   06/20/21 273 lb (123.8 kg)    Physical Exam  Results for orders placed or performed during the hospital encounter of 06/22/21  Brain natriuretic peptide  Result Value Ref Range   B Natriuretic Peptide 57.7 0.0 - 100.0 pg/mL      Assessment & Plan:   Problem List Items Addressed This Visit   None    Follow up plan: No follow-ups on file.

## 2021-08-26 ENCOUNTER — Ambulatory Visit: Payer: Medicare Other | Admitting: Nurse Practitioner

## 2021-08-29 ENCOUNTER — Ambulatory Visit: Payer: Medicare Other | Admitting: Nurse Practitioner

## 2021-12-15 DIAGNOSIS — E119 Type 2 diabetes mellitus without complications: Secondary | ICD-10-CM | POA: Diagnosis not present

## 2021-12-15 LAB — HM DIABETES EYE EXAM

## 2021-12-18 DIAGNOSIS — I209 Angina pectoris, unspecified: Secondary | ICD-10-CM | POA: Insufficient documentation

## 2021-12-18 NOTE — Patient Instructions (Signed)

## 2021-12-22 ENCOUNTER — Encounter: Payer: Self-pay | Admitting: Nurse Practitioner

## 2021-12-22 ENCOUNTER — Other Ambulatory Visit: Payer: Self-pay

## 2021-12-22 ENCOUNTER — Telehealth: Payer: Self-pay

## 2021-12-22 ENCOUNTER — Ambulatory Visit (INDEPENDENT_AMBULATORY_CARE_PROVIDER_SITE_OTHER): Payer: Medicare Other | Admitting: Nurse Practitioner

## 2021-12-22 VITALS — BP 136/66 | HR 84 | Temp 97.8°F | Ht 75.5 in | Wt 273.3 lb

## 2021-12-22 DIAGNOSIS — Z1211 Encounter for screening for malignant neoplasm of colon: Secondary | ICD-10-CM

## 2021-12-22 DIAGNOSIS — Z86010 Personal history of colonic polyps: Secondary | ICD-10-CM

## 2021-12-22 DIAGNOSIS — I700 Atherosclerosis of aorta: Secondary | ICD-10-CM | POA: Diagnosis not present

## 2021-12-22 DIAGNOSIS — I10 Essential (primary) hypertension: Secondary | ICD-10-CM | POA: Diagnosis not present

## 2021-12-22 DIAGNOSIS — R12 Heartburn: Secondary | ICD-10-CM

## 2021-12-22 DIAGNOSIS — R7309 Other abnormal glucose: Secondary | ICD-10-CM

## 2021-12-22 DIAGNOSIS — I209 Angina pectoris, unspecified: Secondary | ICD-10-CM | POA: Diagnosis not present

## 2021-12-22 DIAGNOSIS — E782 Mixed hyperlipidemia: Secondary | ICD-10-CM

## 2021-12-22 DIAGNOSIS — Z23 Encounter for immunization: Secondary | ICD-10-CM

## 2021-12-22 DIAGNOSIS — Z0000 Encounter for general adult medical examination without abnormal findings: Secondary | ICD-10-CM

## 2021-12-22 DIAGNOSIS — N400 Enlarged prostate without lower urinary tract symptoms: Secondary | ICD-10-CM

## 2021-12-22 DIAGNOSIS — G250 Essential tremor: Secondary | ICD-10-CM

## 2021-12-22 LAB — MICROALBUMIN, URINE WAIVED
Creatinine, Urine Waived: 200 mg/dL (ref 10–300)
Microalb, Ur Waived: 30 mg/L — ABNORMAL HIGH (ref 0–19)
Microalb/Creat Ratio: <30 mg/g (ref <30)

## 2021-12-22 LAB — BAYER DCA HB A1C WAIVED: HB A1C (BAYER DCA - WAIVED): 6 % — ABNORMAL HIGH (ref 4.8–5.6)

## 2021-12-22 MED ORDER — METOPROLOL SUCCINATE ER 25 MG PO TB24: ORAL_TABLET | ORAL | 4 refills | Status: DC

## 2021-12-22 MED ORDER — OMEPRAZOLE 40 MG PO CPDR: 40.0000 mg | DELAYED_RELEASE_CAPSULE | Freq: Every morning | ORAL | 4 refills | Status: DC

## 2021-12-22 MED ORDER — ATORVASTATIN CALCIUM 40 MG PO TABS: 40.0000 mg | ORAL_TABLET | Freq: Every day | ORAL | 4 refills | Status: DC

## 2021-12-22 MED ORDER — PEG 3350-KCL-NA BICARB-NACL 420 G PO SOLR: 4000.0000 mL | Freq: Every day | ORAL | 0 refills | Status: AC

## 2021-12-22 NOTE — Assessment & Plan Note (Signed)
Chronic, ongoing, stable with Prilosec.  Continue current medication regimen and adjust as needed. Mag level today. Risks of PPI use were discussed with patient including bone loss, C. Diff diarrhea, pneumonia, infections, CKD, electrolyte abnormalities.  Verbalizes understanding and chooses to continue the medication.

## 2021-12-22 NOTE — Assessment & Plan Note (Signed)
Chronic, ongoing.  Initial BP elevated, but repeat at goal for his age.  Continue current medication regimen + collaboration with cardiology and recommend checking BP daily at home and documenting + focus on DASH diet.  Labs today: CBC, CMP, TSH, urine ALB.  Urine ALB 12 January 2022, could consider adding ARB (no ACE, previous smoker) in future for BP and proteinuria discussed with him.  Return in 6 months.

## 2021-12-22 NOTE — Assessment & Plan Note (Signed)
Chronic, stable without medication at this time.  PSA today.

## 2021-12-22 NOTE — Assessment & Plan Note (Signed)
Chronic, ongoing.  Continue daily statin and adjust dose as needed. Continue daily ASA 81 MG for prevention.  Return in 6 months.  Lipid panel today.

## 2021-12-22 NOTE — Assessment & Plan Note (Signed)
A1c no change from previous = 6% today, continue diet focus at home.  Urine ALB 12 January 2022, could consider addition of ARB if ongoing and if BP can tolerate -- no ACE, previous smoker.

## 2021-12-22 NOTE — Assessment & Plan Note (Signed)
Diagnosed 07/18/21 with left heart cath 07/18/21.  Continue collaboration with cardiology.  No further CP reported.  Will continue medications as ordered by cardiology.

## 2021-12-22 NOTE — Assessment & Plan Note (Addendum)
Chronic.  Noted on CT 08/17/17, recommend continued use of statin daily and daily Baby ASA 81 MG for prevention.  Recommend continued cessation of smoking, he quit in 1982.

## 2021-12-22 NOTE — Assessment & Plan Note (Signed)
BMI 33.71. Recommended eating smaller high protein, low fat meals more frequently and exercising 30 mins a day 5 times a week with a goal of 10-15lb weight loss in the next 3 months. Patient voiced their understanding and motivation to adhere to these recommendations.

## 2021-12-22 NOTE — Telephone Encounter (Signed)
Gastroenterology Pre-Procedure Review  Request Date: 12/30/21 Requesting Physician: Dr. Vicente Males  PATIENT REVIEW QUESTIONS: The patient responded to the following health history questions as indicated:    1. Are you having any GI issues? no 2. Do you have a personal history of Polyps? yes (07/20/2020) 3. Do you have a family history of Colon Cancer or Polyps? yes (brother and nephew colon polyps) 4. Diabetes Mellitus? no 5. Joint replacements in the past 12 months? 07/18/21 Heart Cath Dr. Lujean Amel 6. Major health problems in the past 3 months?no 7. Any artificial heart valves, MVP, or defibrillator?no    MEDICATIONS & ALLERGIES:    Patient reports the following regarding taking any anticoagulation/antiplatelet therapy:   Plavix, Coumadin, Eliquis, Xarelto, Lovenox, Pradaxa, Brilinta, or Effient? no Aspirin? no  Patient confirms/reports the following medications:  Current Outpatient Medications  Medication Sig Dispense Refill   acetaminophen (TYLENOL) 500 MG tablet Take 500-1,000 mg by mouth every 6 (six) hours as needed (for pain.).     Aspirin-Acetaminophen-Caffeine (BC FAST PAIN RELIEF MAX STR) 500-500-65 MG PACK Take 1 packet by mouth daily as needed (pain.).     atorvastatin (LIPITOR) 40 MG tablet Take 1 tablet (40 mg total) by mouth daily. 90 tablet 4   Blood Glucose Monitoring Suppl (ACCU-CHEK AVIVA PLUS) w/Device KIT 1 each by Does not apply route 2 (two) times daily. 1 kit 0   clobetasol cream (TEMOVATE) 0.37 % Apply 1 application. topically 2 (two) times daily. 30 g 4   glucose blood (ACCU-CHEK AVIVA PLUS) test strip 1 each by Other route 2 (two) times daily. 100 each 5   isosorbide mononitrate (IMDUR) 30 MG 24 hr tablet Take 30 mg by mouth daily.     metoprolol succinate (TOPROL-XL) 25 MG 24 hr tablet TAKE ONE TABLET DAILY BY MOUTH. 90 tablet 4   Multiple Vitamin (MULTIVITAMIN WITH MINERALS) TABS tablet Take 1 tablet by mouth 2 (two) times a week. One-A-Day     omeprazole  (PRILOSEC) 40 MG capsule Take 1 capsule (40 mg total) by mouth in the morning. 90 capsule 4   QC LO-DOSE ASPIRIN 81 MG tablet Take 81 mg by mouth daily.     No current facility-administered medications for this visit.    Patient confirms/reports the following allergies:  No Known Allergies  No orders of the defined types were placed in this encounter.   AUTHORIZATION INFORMATION Primary Insurance: 1D#: Group #:  Secondary Insurance: 1D#: Group #:  SCHEDULE INFORMATION: Date: 12/30/20 Time: Location: [facility]

## 2021-12-22 NOTE — Progress Notes (Signed)
BP 136/66 (BP Location: Left Arm, Patient Position: Sitting, Cuff Size: Normal)   Pulse 84   Temp 97.8 F (36.6 C) (Oral)   Ht 6' 3.5" (1.918 m)   Wt 273 lb 4.8 oz (124 kg)   SpO2 95%   BMI 33.71 kg/m    Subjective:    [name], male    DOB: August 25, 1945, 76 y.o.   MRN: [id number]  HPI: Derek Wright is a 76 y.o. male presenting on 12/22/2021 for comprehensive medical examination. Current medical complaints include:none  He currently lives with: wife Interim Problems from his last visit: no   HYPERTENSION/HLD Followed by Dr. Clayborn Bigness and last seen 06/22/21. Had left heart catheterization on 07/18/21 due to angina pain -- started ASA and Imdur. Has had no more chest pain since that time.  Continues on Metoprolol XL and Atorvastatin for HLD.  Continues on Omeprazole at home for GERD.  He has been working on diet and is reducing salt intake.  Aortic atherosclerosis noted on CT imaging 08/17/17. Hypertension status: stable  Satisfied with current treatment? yes Duration of hypertension: chronic BP monitoring frequency:  not checking BP range:  BP medication side effects:  no Medication compliance: good compliance Aspirin: no Recurrent headaches: no Visual changes: no Palpitations: no Dyspnea: no Chest pain: no Lower extremity edema: yes, at baseline, no worse Dizzy/lightheaded: no  The 10-year ASCVD risk score (Arnett DK, et al., 2019) is: 36.7%   Values used to calculate the score:     Age: 59 years     Sex: Male     Is Non-Hispanic African American: Yes     Diabetic: Yes     Tobacco smoker: No     Systolic Blood Pressure: 945 mmHg     Is BP treated: Yes     HDL Cholesterol: 60 mg/dL     Total Cholesterol: 161 mg/dL  PREDIABETES: Last A1c in May 2023 = 6%.  He is currently diet controlled.  Does not check BS at home. Polydipsia/polyuria: no Visual disturbance: no -- had eye exam yesterday Chest pain: no Paresthesias: no  Functional Status Survey: Is the  patient deaf or have difficulty hearing?: No Does the patient have difficulty seeing, even when wearing glasses/contacts?: No Does the patient have difficulty concentrating, remembering, or making decisions?: No Does the patient have difficulty walking or climbing stairs?: No Does the patient have difficulty dressing or bathing?: No Does the patient have difficulty doing errands alone such as visiting a doctor's office or shopping?: No  FALL RISK:    12/22/2021    9:02 AM 08/24/2021    4:23 PM 06/20/2021    8:11 AM 01/26/2021    1:01 PM 12/20/2020    8:02 AM  [place] in the past year? 0 0 0 0 1  Number falls in past yr: 0 0 0 0 0  Injury with Fall? 0 0 0 0 1  Risk for fall due to : No Fall Risks No Fall Risks No Fall Risks  History of fall(s)  Follow up Falls prevention discussed Falls evaluation completed Falls evaluation completed Falls evaluation completed;Falls prevention discussed Falls evaluation completed    Depression Screen    12/22/2021    8:58 AM 08/24/2021    4:23 PM 06/20/2021    8:11 AM 05/12/2021    9:58 AM 01/26/2021    1:28 PM  Depression screen PHQ 2/9  Decreased Interest 0 0 0 0 0  Down, Depressed,  Hopeless 0 0 0 0 0  PHQ - 2 Score 0 0 0 0 0  Altered sleeping 0 0 0 0 0  Tired, decreased energy 0 0 0 0 0  Change in appetite 0 0 0 0 0  Feeling bad or failure about yourself  0 0 0 0 0  Trouble concentrating 0 0 0 0 0  Moving slowly or fidgety/restless 0 0 0 0 0  Suicidal thoughts 0 0 0 0 0  PHQ-9 Score 0 0 0 0 0  Difficult doing work/chores Not difficult at all Not difficult at all Not difficult at all  Not difficult at all    Advanced Directives <no information>  Past Medical History:  Past Medical History:  Diagnosis Date   Arthritis    GERD (gastroesophageal reflux disease)    RARE-NO MEDS   Hyperlipidemia    Hypertension    Pre-diabetes    Sleep apnea    MILD-NO CPAP-COULD NOT TOELERATE   Status post surgical removal of nail matrix of  toe of left foot    Status post surgical removal of nail matrix of toe of right foot     Surgical History:  Past Surgical History:  Procedure Laterality Date   COLONOSCOPY  2012   COLONOSCOPY WITH PROPOFOL N/A 07/20/2020   Procedure: COLONOSCOPY WITH PROPOFOL;  Surgeon: Virgel Manifold, MD;  Location: [facility];  Service: Endoscopy;  Laterality: N/A;   hair folicle removed     HERNIA REPAIR     INGUINAL HERNIA REPAIR Bilateral 09/29/2015   Procedure: LAPAROSCOPIC BILATERAL INGUINAL HERNIA REPAIR;  Surgeon: Robert Bellow, MD;  Location: [facility];  Service: General;  Laterality: Bilateral;   LEFT HEART CATH AND CORONARY ANGIOGRAPHY Left 07/18/2021   Procedure: LEFT HEART CATH AND CORONARY ANGIOGRAPHY;  Surgeon: Yolonda Kida, MD;  Location: [facility];  Service: Cardiovascular;  Laterality: Left;   SKIN GRAFT Right 1966   hand    Medications:  Current Outpatient Medications on File Prior to Visit  Medication Sig   acetaminophen (TYLENOL) 500 MG tablet Take 500-1,000 mg by mouth every 6 (six) hours as needed (for pain.).   Aspirin-Acetaminophen-Caffeine (BC FAST PAIN RELIEF MAX STR) 500-500-65 MG PACK Take 1 packet by mouth daily as needed (pain.).   Blood Glucose Monitoring Suppl (ACCU-CHEK AVIVA PLUS) w/Device KIT 1 each by Does not apply route 2 (two) times daily.   clobetasol cream (TEMOVATE) 2.42 % Apply 1 application. topically 2 (two) times daily.   glucose blood (ACCU-CHEK AVIVA PLUS) test strip 1 each by Other route 2 (two) times daily.   isosorbide mononitrate (IMDUR) 30 MG 24 hr tablet Take 30 mg by mouth daily.   Multiple Vitamin (MULTIVITAMIN WITH MINERALS) TABS tablet Take 1 tablet by mouth 2 (two) times a week. One-A-Day   QC LO-DOSE ASPIRIN 81 MG tablet Take 81 mg by mouth daily.   No current facility-administered medications on file prior to visit.    Allergies:  No Known Allergies  Social History:  Social History   Socioeconomic History    Marital status: Married    Spouse name: Not on file   Number of children: Not on file   Years of education: 12   Highest education level: High school graduate  Occupational History   Not on file  Tobacco Use   Smoking status: Former    Packs/day: 0.25    Years: 15.00    Total pack years: 3.75    Types: Cigarettes  Quit date: 09/13/1980    Years since quitting: 41.3   Smokeless tobacco: Never  Vaping Use   Vaping Use: Never used  Substance and Sexual Activity   Alcohol use: Yes    Alcohol/week: 2.0 standard drinks of alcohol    Types: 2 Cans of beer per week   Drug use: No   Sexual activity: Not on file  Other Topics Concern   Not on file  Social History Narrative   Not on file   Social Determinants of Health   Financial Resource Strain: Low Risk  (01/26/2021)   Overall Financial Resource Strain (CARDIA)    Difficulty of Paying Living Expenses: Not hard at all  Food Insecurity: No Food Insecurity (01/26/2021)   Hunger Vital Sign    Worried About Running Out of Food in the Last Year: Never true    Ran Out of Food in the Last Year: Never true  Transportation Needs: No Transportation Needs (01/26/2021)   PRAPARE - Hydrologist (Medical): No    Lack of Transportation (Non-Medical): No  Physical Activity: Inactive (01/26/2021)   Exercise Vital Sign    Days of Exercise per Week: 0 days    Minutes of Exercise per Session: 0 min  Stress: No Stress Concern Present (01/26/2021)   [place]    Feeling of Stress : Not at all  Social Connections: Moderately Integrated (01/26/2021)   Social Connection and Isolation Panel [NHANES]    Frequency of Communication with Friends and Family: Twice a week    Frequency of Social Gatherings with Friends and Family: Three times a week    Attends Religious Services: More than 4 times per year    Active Member of Clubs or Organizations: No     Attends Archivist Meetings: Never    Marital Status: Married  Human resources officer Violence: Not At Risk (01/26/2021)   Humiliation, Afraid, Rape, and Kick questionnaire    Fear of Current or Ex-Partner: No    Emotionally Abused: No    Physically Abused: No    Sexually Abused: No   Social History   Tobacco Use  Smoking Status Former   Packs/day: 0.25   Years: 15.00   Total pack years: 3.75   Types: Cigarettes   Quit date: 09/13/1980   Years since quitting: 41.3  Smokeless Tobacco Never   Social History   Substance and Sexual Activity  Alcohol Use Yes   Alcohol/week: 2.0 standard drinks of alcohol   Types: 2 Cans of beer per week    Family History:  Family History  Problem Relation Age of Onset   Ovarian cancer Mother    Heart disease Father    Lymphoma Maternal Uncle     Past medical history, surgical history, medications, allergies, family history and social history reviewed with patient today and changes made to appropriate areas of the chart.   Review of Systems - negative All other ROS negative except what is listed above and in the HPI.      Objective:    BP 136/66 (BP Location: Left Arm, Patient Position: Sitting, Cuff Size: Normal)   Pulse 84   Temp 97.8 F (36.6 C) (Oral)   Ht 6' 3.5" (1.918 m)   Wt 273 lb 4.8 oz (124 kg)   SpO2 95%   BMI 33.71 kg/m   Wt Readings from Last 3 Encounters:  12/22/21 273 lb 4.8 oz (124 kg)  08/24/21 270 lb (  122.5 kg)  07/18/21 270 lb (122.5 kg)    Physical Exam Vitals and nursing note reviewed.  Constitutional:      General: He is awake. He is not in acute distress.    Appearance: He is well-developed. He is obese. He is not ill-appearing.  HENT:     Head: Normocephalic and atraumatic.     Right Ear: Hearing, tympanic membrane, ear canal and external ear normal. No drainage.     Left Ear: Hearing, tympanic membrane, ear canal and external ear normal. No drainage.     Nose: Nose normal.     Mouth/Throat:      Pharynx: Oropharynx is clear. Uvula midline.  Eyes:     General: Lids are normal.        Right eye: No discharge.        Left eye: No discharge.     Extraocular Movements: Extraocular movements intact.     Conjunctiva/sclera: Conjunctivae normal.     Pupils: Pupils are equal, round, and reactive to light.     Visual Fields: Right eye visual fields normal and left eye visual fields normal.  Neck:     Thyroid: No thyromegaly.     Vascular: No carotid bruit.  Cardiovascular:     Rate and Rhythm: Normal rate and regular rhythm.     Heart sounds: Normal heart sounds, S1 normal and S2 normal. No murmur heard.    No gallop.  Pulmonary:     Effort: Pulmonary effort is normal. No accessory muscle usage or respiratory distress.     Breath sounds: Normal breath sounds.  Abdominal:     General: Bowel sounds are normal.     Palpations: Abdomen is soft. There is no hepatomegaly or splenomegaly.     Tenderness: There is no abdominal tenderness.     Hernia: There is no hernia in the left inguinal area or right inguinal area.  Genitourinary:    Penis: Normal.      Testes: Normal.  Musculoskeletal:        General: Normal range of motion.     Cervical back: Normal range of motion and neck supple.     Right lower leg: 2+ Pitting Edema present.     Left lower leg: 2+ Pitting Edema present.  Lymphadenopathy:     Head:     Right side of head: No submental, submandibular, tonsillar, preauricular or posterior auricular adenopathy.     Left side of head: No submental, submandibular, tonsillar, preauricular or posterior auricular adenopathy.     Cervical: No cervical adenopathy.  Skin:    General: Skin is warm and dry.     Capillary Refill: Capillary refill takes less than 2 seconds.     Findings: No rash.  Neurological:     Mental Status: He is alert and oriented to person, place, and time.     Gait: Gait is intact.     Deep Tendon Reflexes:     Reflex Scores:      Brachioradialis reflexes are  1+ on the right side and 1+ on the left side.      Patellar reflexes are 1+ on the right side and 1+ on the left side. Psychiatric:        Attention and Perception: Attention normal.        Mood and Affect: Mood normal.        Speech: Speech normal.        Behavior: Behavior normal. Behavior is cooperative.  Thought Content: Thought content normal.        Cognition and Memory: Cognition normal.        Judgment: Judgment normal.    Diabetic Foot Exam - Simple   Simple Foot Form Visual Inspection See comments: Yes Sensation Testing Intact to touch and monofilament testing bilaterally: Yes Pulse Check Posterior Tibialis and Dorsalis pulse intact bilaterally: Yes Comments Xerosis bilateral feet and thickened toenails.        12/22/2021    9:21 AM 12/20/2020    8:42 AM 01/26/2020    2:33 PM 12/12/2019    9:44 AM 10/09/2018    9:28 AM  6CIT Screen  What Year? 0 points 0 points 0 points 0 points 0 points  What month? 0 points 0 points 0 points 0 points 0 points  What time? 0 points 0 points 0 points 0 points 0 points  Count back from 20 0 points 0 points 0 points 0 points 0 points  Months in reverse 0 points 0 points 0 points 0 points 0 points  Repeat phrase 0 points 0 points 0 points 0 points 0 points  Total Score 0 points 0 points 0 points 0 points 0 points     Diabetic Foot Exam - Simple   No data filed     Results for orders placed or performed in visit on 12/22/21  Bayer DCA Hb A1c Waived  Result Value Ref Range   HB A1C (BAYER DCA - WAIVED) 6.0 (H) 4.8 - 5.6 %  Microalbumin, Urine Waived  Result Value Ref Range   Microalb, Ur Waived 30 (H) 0 - 19 mg/L   Creatinine, Urine Waived 200 10 - 300 mg/dL   Microalb/Creat Ratio <30 <30 mg/g      Assessment & Plan:   Problem List Items Addressed This Visit       Cardiovascular and Mediastinum   Angina pectoris ([place]) - Primary (Chronic)    Diagnosed 07/18/21 with left heart cath 07/18/21.  Continue collaboration  with cardiology.  No further CP reported.  Will continue medications as ordered by cardiology.      Relevant Medications   isosorbide mononitrate (IMDUR) 30 MG 24 hr tablet   QC LO-DOSE ASPIRIN 81 MG tablet   metoprolol succinate (TOPROL-XL) 25 MG 24 hr tablet   atorvastatin (LIPITOR) 40 MG tablet   Other Relevant Orders   Comprehensive metabolic panel   Lipid Panel w/o Chol/HDL Ratio   Atherosclerosis of aorta (HCC) (Chronic)    Chronic.  Noted on CT 08/17/17, recommend continued use of statin daily and daily Baby ASA 81 MG for prevention.  Recommend continued cessation of smoking, he quit in 1982.      Relevant Medications   isosorbide mononitrate (IMDUR) 30 MG 24 hr tablet   QC LO-DOSE ASPIRIN 81 MG tablet   metoprolol succinate (TOPROL-XL) 25 MG 24 hr tablet   atorvastatin (LIPITOR) 40 MG tablet   Other Relevant Orders   Comprehensive metabolic panel   Lipid Panel w/o Chol/HDL Ratio   HTN (hypertension) (Chronic)    Chronic, ongoing.  Initial BP elevated, but repeat at goal for his age.  Continue current medication regimen + collaboration with cardiology and recommend checking BP daily at home and documenting + focus on DASH diet.  Labs today: CBC, CMP, TSH, urine ALB.  Urine ALB 12 January 2022, could consider adding ARB (no ACE, previous smoker) in future for BP and proteinuria discussed with him.  Return in 6 months.  Relevant Medications   isosorbide mononitrate (IMDUR) 30 MG 24 hr tablet   QC LO-DOSE ASPIRIN 81 MG tablet   metoprolol succinate (TOPROL-XL) 25 MG 24 hr tablet   atorvastatin (LIPITOR) 40 MG tablet   Other Relevant Orders   Microalbumin, Urine Waived (Completed)   CBC with Differential/Platelet   Comprehensive metabolic panel   TSH     Genitourinary   BPH without obstruction/lower urinary tract symptoms    Chronic, stable without medication at this time.  PSA today.      Relevant Orders   PSA     Other   Elevated hemoglobin A1c (Chronic)    A1c  no change from previous = 6% today, continue diet focus at home.  Urine ALB 12 January 2022, could consider addition of ARB if ongoing and if BP can tolerate -- no ACE, previous smoker.      Relevant Orders   Bayer DCA Hb A1c Waived (Completed)   Microalbumin, Urine Waived (Completed)   Heart burn (Chronic)    Chronic, ongoing, stable with Prilosec.  Continue current medication regimen and adjust as needed. Mag level today. Risks of PPI use were discussed with patient including bone loss, C. Diff diarrhea, pneumonia, infections, CKD, electrolyte abnormalities.  Verbalizes understanding and chooses to continue the medication.       Relevant Orders   Magnesium   Hyperlipidemia (Chronic)    Chronic, ongoing.  Continue daily statin and adjust dose as needed. Continue daily ASA 81 MG for prevention.  Return in 6 months.  Lipid panel today.      Relevant Medications   isosorbide mononitrate (IMDUR) 30 MG 24 hr tablet   QC LO-DOSE ASPIRIN 81 MG tablet   metoprolol succinate (TOPROL-XL) 25 MG 24 hr tablet   atorvastatin (LIPITOR) 40 MG tablet   Other Relevant Orders   Comprehensive metabolic panel   Lipid Panel w/o Chol/HDL Ratio   Morbid obesity (HCC) (Chronic)    BMI 33.71. Recommended eating smaller high protein, low fat meals more frequently and exercising 30 mins a day 5 times a week with a goal of 10-15lb weight loss in the next 3 months. Patient voiced their understanding and motivation to adhere to these recommendations.        Other Visit Diagnoses     Colon cancer screening       GI referral in place.   Relevant Orders   Ambulatory referral to Gastroenterology   Encounter for annual physical exam       Annual physical today with labs and health maintenance reviewed, discussed with patient.        Discussed aspirin prophylaxis for myocardial infarction prevention and decision was made to continue ASA  LABORATORY TESTING:  Health maintenance labs ordered today as  discussed above.   The natural history of prostate cancer and ongoing controversy regarding screening and potential treatment outcomes of prostate cancer has been discussed with the patient. The meaning of a false positive PSA and a false negative PSA has been discussed. He indicates understanding of the limitations of this screening test and wishes to proceed with screening PSA testing.   IMMUNIZATIONS:   - Tdap: Tetanus vaccination status reviewed: last tetanus booster within 10 years. - Influenza: Up to date - Pneumovax: Up to date - Prevnar: Up to date - Zostavax vaccine: Not Up To Date == $147 cost  SCREENING: - Colonoscopy: Up to date due again next year Discussed with patient purpose of the colonoscopy is to detect colon cancer  at curable precancerous or early stages   - AAA Screening: Reports having done in past -Hearing Test: Not applicable  -Spirometry: Not applicable   PATIENT COUNSELING:    Sexuality: Discussed sexually transmitted diseases, partner selection, use of condoms, avoidance of unintended pregnancy  and contraceptive alternatives.   Advised to avoid cigarette smoking.  I discussed with the patient that most people either abstain from alcohol or drink within safe limits (<=14/week and <=4 drinks/occasion for males, <=7/weeks and <= 3 drinks/occasion for females) and that the risk for alcohol disorders and other health effects rises proportionally with the number of drinks per week and how often a drinker exceeds daily limits.  Discussed cessation/primary prevention of drug use and availability of treatment for abuse.   Diet: Encouraged to adjust caloric intake to maintain  or achieve ideal body weight, to reduce intake of dietary saturated fat and total fat, to limit sodium intake by avoiding high sodium foods and not adding table salt, and to maintain adequate dietary potassium and calcium preferably from fresh fruits, vegetables, and low-fat dairy products.     Stressed the importance of regular exercise  Injury prevention: Discussed safety belts, safety helmets, smoke detector, smoking near bedding or upholstery.   Dental health: Discussed importance of regular tooth brushing, flossing, and dental visits.   Follow up plan: NEXT PREVENTATIVE PHYSICAL DUE IN 1 YEAR. Return in about 6 months (around 06/22/2022) for HTN/HLD, PREDIABETES, GERD, ANGINA.

## 2021-12-23 ENCOUNTER — Other Ambulatory Visit: Payer: Self-pay | Admitting: Nurse Practitioner

## 2021-12-23 DIAGNOSIS — R972 Elevated prostate specific antigen [PSA]: Secondary | ICD-10-CM

## 2021-12-23 LAB — CBC WITH DIFFERENTIAL/PLATELET
Basophils Absolute: 0 10*3/uL (ref 0.0–0.2)
Basos: 1 %
EOS (ABSOLUTE): 0.2 10*3/uL (ref 0.0–0.4)
Eos: 5 %
Hematocrit: 40.3 % (ref 37.5–51.0)
Hemoglobin: 13.5 g/dL (ref 13.0–17.7)
Immature Grans (Abs): 0 10*3/uL (ref 0.0–0.1)
Immature Granulocytes: 0 %
Lymphocytes Absolute: 1.3 10*3/uL (ref 0.7–3.1)
Lymphs: 32 %
MCH: 32.2 pg (ref 26.6–33.0)
MCHC: 33.5 g/dL (ref 31.5–35.7)
MCV: 96 fL (ref 79–97)
Monocytes Absolute: 0.5 10*3/uL (ref 0.1–0.9)
Monocytes: 13 %
Neutrophils Absolute: 2 10*3/uL (ref 1.4–7.0)
Neutrophils: 49 %
Platelets: 183 10*3/uL (ref 150–450)
RBC: 4.19 x10E6/uL (ref 4.14–5.80)
RDW: 13.2 % (ref 11.6–15.4)
WBC: 4.1 10*3/uL (ref 3.4–10.8)

## 2021-12-23 LAB — COMPREHENSIVE METABOLIC PANEL
ALT: 14 IU/L (ref 0–44)
AST: 17 IU/L (ref 0–40)
Albumin/Globulin Ratio: 1.6 (ref 1.2–2.2)
Albumin: 4.1 g/dL (ref 3.8–4.8)
Alkaline Phosphatase: 73 IU/L (ref 44–121)
BUN/Creatinine Ratio: 16 (ref 10–24)
BUN: 15 mg/dL (ref 8–27)
Bilirubin Total: 0.6 mg/dL (ref 0.0–1.2)
CO2: 25 mmol/L (ref 20–29)
Calcium: 9.3 mg/dL (ref 8.6–10.2)
Chloride: 106 mmol/L (ref 96–106)
Creatinine, Ser: 0.95 mg/dL (ref 0.76–1.27)
Globulin, Total: 2.6 g/dL (ref 1.5–4.5)
Glucose: 120 mg/dL — ABNORMAL HIGH (ref 70–99)
Potassium: 4 mmol/L (ref 3.5–5.2)
Sodium: 143 mmol/L (ref 134–144)
Total Protein: 6.7 g/dL (ref 6.0–8.5)
eGFR: 84 mL/min/{1.73_m2} (ref >59)

## 2021-12-23 LAB — LIPID PANEL W/O CHOL/HDL RATIO
Cholesterol, Total: 146 mg/dL (ref 100–199)
HDL: 62 mg/dL (ref >39)
LDL Chol Calc (NIH): 72 mg/dL (ref 0–99)
Triglycerides: 58 mg/dL (ref 0–149)
VLDL Cholesterol Cal: 12 mg/dL (ref 5–40)

## 2021-12-23 LAB — PSA: Prostate Specific Ag, Serum: 4.5 ng/mL — ABNORMAL HIGH (ref 0.0–4.0)

## 2021-12-23 LAB — MAGNESIUM: Magnesium: 1.8 mg/dL (ref 1.6–2.3)

## 2021-12-23 LAB — TSH: TSH: 1.62 u[IU]/mL (ref 0.450–4.500)

## 2021-12-23 NOTE — Progress Notes (Signed)
Good afternoon, please let Jamell know all his labs returned and look fabulous with exception of prostate blood work.  It has trended up a little bit from previous, but is in normal range for his age and ethnicity.  I would like to recheck this on outpatient labs only in 4 weeks to ensure no further trend up.  Please schedule lab only visit for him:) Any questions? Keep being amazing!!  Thank you for allowing me to participate in your care.  I appreciate you. Kindest regards, Haset [place]

## 2021-12-26 ENCOUNTER — Encounter: Payer: Self-pay | Admitting: Nurse Practitioner

## 2021-12-30 ENCOUNTER — Encounter: Payer: Self-pay | Admitting: Gastroenterology

## 2021-12-30 ENCOUNTER — Ambulatory Visit
Admission: RE | Admit: 2021-12-30 | Discharge: 2021-12-30 | Disposition: A | Discharge status: Home / Self Care | Payer: Medicare Other | Attending: Gastroenterology | Admitting: Gastroenterology

## 2021-12-30 ENCOUNTER — Ambulatory Visit: Payer: Medicare Other | Admitting: Anesthesiology

## 2021-12-30 ENCOUNTER — Encounter
Admission: RE | Disposition: A | Discharge status: Home / Self Care | Payer: Self-pay | Source: Home / Self Care | Attending: Gastroenterology

## 2021-12-30 ENCOUNTER — Telehealth: Payer: Self-pay

## 2021-12-30 ENCOUNTER — Other Ambulatory Visit: Payer: Self-pay

## 2021-12-30 DIAGNOSIS — Z86010 Personal history of colonic polyps: Secondary | ICD-10-CM | POA: Diagnosis not present

## 2021-12-30 DIAGNOSIS — D126 Benign neoplasm of colon, unspecified: Secondary | ICD-10-CM

## 2021-12-30 DIAGNOSIS — E785 Hyperlipidemia, unspecified: Secondary | ICD-10-CM | POA: Insufficient documentation

## 2021-12-30 DIAGNOSIS — G4730 Sleep apnea, unspecified: Secondary | ICD-10-CM | POA: Diagnosis not present

## 2021-12-30 DIAGNOSIS — K635 Polyp of colon: Secondary | ICD-10-CM | POA: Diagnosis not present

## 2021-12-30 DIAGNOSIS — Z1211 Encounter for screening for malignant neoplasm of colon: Secondary | ICD-10-CM | POA: Diagnosis not present

## 2021-12-30 DIAGNOSIS — K219 Gastro-esophageal reflux disease without esophagitis: Secondary | ICD-10-CM | POA: Diagnosis not present

## 2021-12-30 DIAGNOSIS — R7303 Prediabetes: Secondary | ICD-10-CM | POA: Diagnosis not present

## 2021-12-30 DIAGNOSIS — I10 Essential (primary) hypertension: Secondary | ICD-10-CM | POA: Insufficient documentation

## 2021-12-30 DIAGNOSIS — Z91199 Patient's noncompliance with other medical treatment and regimen due to unspecified reason: Secondary | ICD-10-CM | POA: Diagnosis not present

## 2021-12-30 DIAGNOSIS — Z87891 Personal history of nicotine dependence: Secondary | ICD-10-CM | POA: Diagnosis not present

## 2021-12-30 HISTORY — PX: COLONOSCOPY WITH PROPOFOL: SHX5780

## 2021-12-30 SURGERY — COLONOSCOPY WITH PROPOFOL
Anesthesia: General

## 2021-12-30 MED ORDER — PROPOFOL 500 MG/50ML IV EMUL
INTRAVENOUS | Status: DC | PRN
  Administered 2021-12-30: 150 ug/kg/min via INTRAVENOUS

## 2021-12-30 MED ORDER — LIDOCAINE 2% (20 MG/ML) 5 ML SYRINGE
INTRAMUSCULAR | Status: DC | PRN
  Administered 2021-12-30: 50 mg via INTRAVENOUS

## 2021-12-30 MED ORDER — SODIUM CHLORIDE 0.9 % IV SOLN: INTRAVENOUS | Status: DC

## 2021-12-30 MED ORDER — LIDOCAINE HCL (PF) 2 % IJ SOLN
INTRAMUSCULAR | Status: AC
  Filled 2021-12-30: qty 5

## 2021-12-30 MED ORDER — PROPOFOL 10 MG/ML IV BOLUS
INTRAVENOUS | Status: DC | PRN
  Administered 2021-12-30: 80 mg via INTRAVENOUS

## 2021-12-30 MED ORDER — PROPOFOL 1000 MG/100ML IV EMUL
INTRAVENOUS | Status: AC
  Filled 2021-12-30: qty 100

## 2021-12-30 NOTE — Anesthesia Preprocedure Evaluation (Signed)
Anesthesia Evaluation  Patient identified by MRN, date of birth, ID band Patient awake    Reviewed: Allergy & Precautions, NPO status , Patient's Chart, lab work & pertinent test results  History of Anesthesia Complications Negative for: history of anesthetic complications  Airway Mallampati: III  TM Distance: >3 FB Neck ROM: full    Dental  (+) Upper Dentures, Lower Dentures   Pulmonary sleep apnea    Pulmonary exam normal        Cardiovascular Exercise Tolerance: Good hypertension, Normal cardiovascular exam     Neuro/Psych negative neurological ROS  negative psych ROS   GI/Hepatic Neg liver ROS,GERD  Controlled,,  Endo/Other  negative endocrine ROS    Renal/GU negative Renal ROS  negative genitourinary   Musculoskeletal   Abdominal   Peds  Hematology negative hematology ROS (+)   Anesthesia Other Findings Past Medical History: No date: Arthritis No date: GERD (gastroesophageal reflux disease)     Comment:  RARE-NO MEDS No date: Hyperlipidemia No date: Hypertension No date: Pre-diabetes No date: Sleep apnea     Comment:  MILD-NO CPAP-COULD NOT TOELERATE No date: Status post surgical removal of nail matrix of toe of left  foot No date: Status post surgical removal of nail matrix of toe of right  foot  Past Surgical History: 2012: COLONOSCOPY 07/20/2020: COLONOSCOPY WITH PROPOFOL; N/A     Comment:  Procedure: COLONOSCOPY WITH PROPOFOL;  Surgeon:               Virgel Manifold, MD;  Location: [facility] END[facility];                Service: Endoscopy;  Laterality: N/A; No date: hair folicle removed No date: HERNIA REPAIR 09/29/2015: INGUINAL HERNIA REPAIR; Bilateral     Comment:  Procedure: LAPAROSCOPIC BILATERAL INGUINAL HERNIA               REPAIR;  Surgeon: Robert Bellow, MD;  Location: [facility]               ORS;  Service: General;  Laterality: Bilateral; 07/18/2021: LEFT HEART CATH AND CORONARY  ANGIOGRAPHY; Left     Comment:  Procedure: LEFT HEART CATH AND CORONARY ANGIOGRAPHY;                Surgeon: Yolonda Kida, MD;  Location: [facility] [facility];  Service: Cardiovascular;  Laterality: Left; 1966: SKIN GRAFT; Right     Comment:  hand  BMI    Body Mass Index: 33.75 kg/m      Reproductive/Obstetrics negative OB ROS                             Anesthesia Physical Anesthesia Plan  ASA: 3  Anesthesia Plan: General   Post-op Pain Management:    Induction: Intravenous  PONV Risk Score and Plan: Propofol infusion and TIVA  Airway Management Planned: Natural Airway and Nasal Cannula  Additional Equipment:   Intra-op Plan:   Post-operative Plan:   Informed Consent: I have reviewed the patients History and Physical, chart, labs and discussed the procedure including the risks, benefits and alternatives for the proposed anesthesia with the patient or authorized representative who has indicated his/her understanding and acceptance.     Dental Advisory Given  Plan Discussed with: Anesthesiologist, CRNA and Surgeon  Anesthesia Plan Comments: (Patient consented for risks of anesthesia including but not  limited to:  - adverse reactions to medications - risk of airway placement if required - damage to eyes, teeth, lips or other oral mucosa - nerve damage due to positioning  - sore throat or hoarseness - Damage to heart, brain, nerves, lungs, other parts of body or loss of life  Patient voiced understanding.)       Anesthesia Quick Evaluation

## 2021-12-30 NOTE — Transfer of Care (Signed)
Immediate Anesthesia Transfer of Care Note  Patient: Derek Wright  Procedure(s) Performed: COLONOSCOPY WITH PROPOFOL  Patient Location: Endoscopy Unit  Anesthesia Type:General  Level of Consciousness: drowsy  Airway & Oxygen Therapy: Patient Spontanous Breathing  Post-op Assessment: Report given to RN and Post -op Vital signs reviewed and stable  Post vital signs: Reviewed and stable  Last Vitals:  Vitals Value Taken Time  BP 80/59 12/30/21 0813  Temp 35.6 C 12/30/21 0809  Pulse 73 12/30/21 0817  Resp 11 12/30/21 0817  SpO2 100 % 12/30/21 0817  Vitals shown include unvalidated device data.  Last Pain:  Vitals:   12/30/21 0809  TempSrc: Temporal  PainSc:          Complications: No notable events documented.

## 2021-12-30 NOTE — H&P (Signed)
Derek Bellows, MD [address] [place], [place], [place], [address] [address], [place], [place], [place], [address] Phone: [phone]  Fax: [phone]  Primary Care Physician:  Venita Lick, NP   Pre-Procedure History & Physical: HPI:  Derek Wright is a 76 y.o. male is here for an colonoscopy.   Past Medical History:  Diagnosis Date   Arthritis    GERD (gastroesophageal reflux disease)    RARE-NO MEDS   Hyperlipidemia    Hypertension    Pre-diabetes    Sleep apnea    MILD-NO CPAP-COULD NOT TOELERATE   Status post surgical removal of nail matrix of toe of left foot    Status post surgical removal of nail matrix of toe of right foot     Past Surgical History:  Procedure Laterality Date   COLONOSCOPY  2012   COLONOSCOPY WITH PROPOFOL N/A 07/20/2020   Procedure: COLONOSCOPY WITH PROPOFOL;  Surgeon: Virgel Manifold, MD;  Location: [facility];  Service: Endoscopy;  Laterality: N/A;   hair folicle removed     HERNIA REPAIR     INGUINAL HERNIA REPAIR Bilateral 09/29/2015   Procedure: LAPAROSCOPIC BILATERAL INGUINAL HERNIA REPAIR;  Surgeon: Robert Bellow, MD;  Location: [facility];  Service: General;  Laterality: Bilateral;   LEFT HEART CATH AND CORONARY ANGIOGRAPHY Left 07/18/2021   Procedure: LEFT HEART CATH AND CORONARY ANGIOGRAPHY;  Surgeon: Yolonda Kida, MD;  Location: [facility];  Service: Cardiovascular;  Laterality: Left;   SKIN GRAFT Right 1966   hand    Prior to Admission medications   Medication Sig Start Date End Date Taking? Authorizing Provider  acetaminophen (TYLENOL) 500 MG tablet Take 500-1,000 mg by mouth every 6 (six) hours as needed (for pain.).   Yes [provider]  Aspirin-Acetaminophen-Caffeine (BC FAST PAIN RELIEF MAX STR) 500-500-65 MG PACK Take 1 packet by mouth daily as needed (pain.).   Yes [provider]  atorvastatin (LIPITOR) 40 MG tablet Take 1 tablet (40 mg total) by mouth daily.  12/22/21  Yes Cannady, Jolene T, NP  Blood Glucose Monitoring Suppl (ACCU-CHEK AVIVA PLUS) w/Device KIT 1 each by Does not apply route 2 (two) times daily. 01/19/21  Yes Cannady, Jolene T, NP  clobetasol cream (TEMOVATE) 9.47 % Apply 1 application. topically 2 (two) times daily. 06/20/21  Yes Cannady, Jolene T, NP  glucose blood (ACCU-CHEK AVIVA PLUS) test strip 1 each by Other route 2 (two) times daily. 01/19/21  Yes Cannady, Jolene T, NP  isosorbide mononitrate (IMDUR) 30 MG 24 hr tablet Take 30 mg by mouth daily. 09/09/21  Yes [provider]  metoprolol succinate (TOPROL-XL) 25 MG 24 hr tablet TAKE ONE TABLET DAILY BY MOUTH. 12/22/21  Yes Cannady, Jolene T, NP  Multiple Vitamin (MULTIVITAMIN WITH MINERALS) TABS tablet Take 1 tablet by mouth 2 (two) times a week. One-A-Day   Yes [provider]  omeprazole (PRILOSEC) 40 MG capsule Take 1 capsule (40 mg total) by mouth in the morning. 12/22/21  Yes Cannady, Jolene T, NP  QC LO-DOSE ASPIRIN 81 MG tablet Take 81 mg by mouth daily. 12/15/21  Yes [provider]    Allergies as of 12/22/2021   (No Known Allergies)    Family History  Problem Relation Age of Onset   Ovarian cancer Mother    Heart disease Father    Lymphoma Maternal Uncle     Social History   Socioeconomic History   Marital status: Married  Spouse name: Not on file   Number of children: Not on file   Years of education: 12   Highest education level: High school graduate  Occupational History   Not on file  Tobacco Use   Smoking status: Former    Packs/day: 0.25    Years: 15.00    Total pack years: 3.75    Types: Cigarettes    Quit date: 09/13/1980    Years since quitting: 41.3   Smokeless tobacco: Never  Vaping Use   Vaping Use: Never used  Substance and Sexual Activity   Alcohol use: Yes    Alcohol/week: 2.0 standard drinks of alcohol    Types: 2 Cans of beer per week   Drug use: No   Sexual activity: Not on file  Other Topics Concern    Not on file  Social History Narrative   Not on file   Social Determinants of Health   Financial Resource Strain: Low Risk  (01/26/2021)   Overall Financial Resource Strain (CARDIA)    Difficulty of Paying Living Expenses: Not hard at all  Food Insecurity: No Food Insecurity (01/26/2021)   Hunger Vital Sign    Worried About Running Out of Food in the Last Year: Never true    Ran Out of Food in the Last Year: Never true  Transportation Needs: No Transportation Needs (01/26/2021)   PRAPARE - Hydrologist (Medical): No    Lack of Transportation (Non-Medical): No  Physical Activity: Inactive (01/26/2021)   Exercise Vital Sign    Days of Exercise per Week: 0 days    Minutes of Exercise per Session: 0 min  Stress: No Stress Concern Present (01/26/2021)   [place]    Feeling of Stress : Not at all  Social Connections: Moderately Integrated (01/26/2021)   Social Connection and Isolation Panel [NHANES]    Frequency of Communication with Friends and Family: Twice a week    Frequency of Social Gatherings with Friends and Family: Three times a week    Attends Religious Services: More than 4 times per year    Active Member of Clubs or Organizations: No    Attends Archivist Meetings: Never    Marital Status: Married  Human resources officer Violence: Not At Risk (01/26/2021)   Humiliation, Afraid, Rape, and Kick questionnaire    Fear of Current or Ex-Partner: No    Emotionally Abused: No    Physically Abused: No    Sexually Abused: No    Review of Systems: See HPI, otherwise negative ROS  Physical Exam: BP 116/76   Pulse 85   Temp (!) 96.9 F (36.1 C) (Temporal)   Resp 18   Ht _0  (1.905 m)   Wt 122.5 kg   SpO2 100%   BMI 33.75 kg/m  General:   Alert,  pleasant and cooperative in NAD Head:  Normocephalic and atraumatic. Neck:  Supple; no masses or thyromegaly. Lungs:  Clear  throughout to auscultation, normal respiratory effort.    Heart:  +S1, +S2, Regular rate and rhythm, No edema. Abdomen:  Soft, nontender and nondistended. Normal bowel sounds, without guarding, and without rebound.   Neurologic:  Alert and  oriented x4;  grossly normal neurologically.  Impression/Plan: Derek Wright is here for an colonoscopy to be performed for Screening colonoscopy average risk   Risks, benefits, limitations, and alternatives regarding  colonoscopy have been reviewed with the patient.  Questions have been answered.  All parties agreeable.   Derek Bellows, MD  12/30/2021, 7:50 AM

## 2021-12-30 NOTE — Telephone Encounter (Signed)
Patient was scheduled for his colonoscopy with Dr. Vicente Males this morning, however his procedure could not be performed due to not being clean.  Pt was scheduled with a 2 day prep.  Instructions were also noted for 2 day prep.  LVM for pt to return my call to reschedule his colonoscopy and determine why he did not completely clean out.  Thanks,  [place], [place]

## 2021-12-30 NOTE — Anesthesia Postprocedure Evaluation (Signed)
Anesthesia Post Note  Patient: KEYSHAWN HELLWIG  Procedure(s) Performed: COLONOSCOPY WITH PROPOFOL  Patient location during evaluation: Endoscopy Anesthesia Type: General Level of consciousness: awake and alert Pain management: pain level controlled Vital Signs Assessment: post-procedure vital signs reviewed and stable Respiratory status: spontaneous breathing, nonlabored ventilation, respiratory function stable and patient connected to nasal cannula oxygen Cardiovascular status: blood pressure returned to baseline and stable Postop Assessment: no apparent nausea or vomiting Anesthetic complications: no   No notable events documented.   Last Vitals:  Vitals:   12/30/21 0832 12/30/21 0839  BP: 105/68 100/73  Pulse:    Resp:    Temp:    SpO2:      Last Pain:  Vitals:   12/30/21 0809  TempSrc: Temporal  PainSc:                  Precious Haws Moishy Laday

## 2021-12-30 NOTE — Op Note (Signed)
[facility]astroenter[facility] Patient Name: Derek Wright Procedure Date: 12/30/2021 7:52 AM MRN: [id number] Account #: [id number] Date of Birth: 07/03/1945 Admit Type: Outpatient Age: 76 Room: [facility] ENDO ROOM 4 Gender: Male Note Status: Finalized Instrument Name: Jasper Riling [id number] Procedure:             Colonoscopy Indications:           Screening for colorectal malignant neoplasm Providers:             [name] MD, MD Referring MD:          [name] MD, MD (Referring MD), Barbaraann Faster. Ned Card                         (Referring MD) Medicines:             Monitored Anesthesia Care Complications:         No immediate complications. Procedure:             Pre-Anesthesia Assessment:                        - Prior to the procedure, a History and Physical was                         performed, and patient medications, allergies and                         sensitivities were reviewed. The patient's tolerance                         of previous anesthesia was reviewed.                        - The risks and benefits of the procedure and the                         sedation options and risks were discussed with the                         patient. All questions were answered and informed                         consent was obtained.                        - ASA Grade Assessment: II - A patient with mild                         systemic disease.                        After obtaining informed consent, the colonoscope was                         passed under direct vision. Throughout the procedure,                         the patient's blood pressure, pulse, and oxygen                         saturations were  monitored continuously. The                         Colonoscope was introduced through the anus and                         advanced to the the cecum, identified by the                         appendiceal orifice. The colonoscopy was performed                          with ease. The patient tolerated the procedure well.                         The quality of the bowel preparation was                         unsatisfactory. The appendiceal orifice was                         photographed. Findings:      The perianal and digital rectal examinations were normal.      A 5 mm polyp was found in the descending colon. The polyp was sessile.       The polyp was removed with a cold snare. Resection was complete, but the       polyp tissue was not retrieved.      Extensive amounts of semi-liquid stool was found in the entire colon,       interfering with visualization. Impression:            - Preparation of the colon was unsatisfactory.                        - One 5 mm polyp in the descending colon, removed with                         a cold snare. Resected and retrieved.                        - Stool in the entire examined colon. Recommendation:        - Discharge patient to home (with escort).                        - Resume previous diet.                        - Continue present medications.                        - Repeat colonoscopy in 2 weeks because the bowel                         preparation was suboptimal. Procedure Code(s):     --- Professional ---                        [phone], Colonoscopy, flexible; with removal of  tumor(s), polyp(s), or other lesion(s) by snare                         technique Diagnosis Code(s):     --- Professional ---                        Z12.11, Encounter for screening for malignant neoplasm                         of colon                        D12.4, Benign neoplasm of descending colon CPT copyright 2022 American Medical Association. All rights reserved. The codes documented in this report are preliminary and upon coder review may  be revised to meet current compliance requirements. [name], MD [name] MD, MD 12/30/2021 8:07:32 AM This report has been signed electronically. Number  of Addenda: 0 Note Initiated On: 12/30/2021 7:52 AM Scope Withdrawal Time: 0 hours 1 minute 26 seconds  Total Procedure Duration: 0 hours 8 minutes 11 seconds  Estimated Blood Loss:  Estimated blood loss: none.      [facility]

## 2022-01-03 ENCOUNTER — Telehealth: Payer: Self-pay

## 2022-01-03 DIAGNOSIS — Z86010 Personal history of colonic polyps: Secondary | ICD-10-CM

## 2022-01-03 NOTE — Telephone Encounter (Signed)
Cardiac clearance was granted by Dr. Clayborn Bigness on 12/26/21 for patients colonoscopy performed on 12/30/21.  Pt will need to be scheduled again for repeat colonoscopy with 2 day prep.  Noted on 12/30/21.  Thanks,  [place], [place]

## 2022-01-16 ENCOUNTER — Encounter: Payer: Self-pay | Admitting: Nurse Practitioner

## 2022-01-16 ENCOUNTER — Ambulatory Visit: Payer: Medicare Other | Admitting: Nurse Practitioner

## 2022-01-16 VITALS — BP 103/62 | HR 81 | Temp 97.3°F | Wt 264.8 lb

## 2022-01-16 DIAGNOSIS — T148XXA Other injury of unspecified body region, initial encounter: Secondary | ICD-10-CM

## 2022-01-16 DIAGNOSIS — Z23 Encounter for immunization: Secondary | ICD-10-CM | POA: Diagnosis not present

## 2022-01-16 MED ORDER — SULFAMETHOXAZOLE-TRIMETHOPRIM 800-160 MG PO TABS: 1.0000 | ORAL_TABLET | Freq: Two times a day (BID) | ORAL | 0 refills | Status: DC

## 2022-01-16 NOTE — Progress Notes (Signed)
BP 103/62   Pulse 81   Temp (!) 97.3 F (36.3 C) (Oral)   Wt 264 lb 12.8 oz (120.1 kg)   SpO2 98%   BMI 33.10 kg/m    Subjective:    [name], male    DOB: [birth year], 76 y.o.   MRN: [id number]  HPI: Derek Wright is a 76 y.o. male  Chief Complaint  Patient presents with   Foot Injury    Pt states he stepped on a nail Friday with his L foot. States he has been cleaning the area with peroxide, very sore per patient.    Patient states he stepped on a nail on Friday on his Left foot.  He has been cleaning the area with peroxide.  States it is very sore.  Denies any fever, discharge from the area.  But does have swelling.      Relevant past medical, surgical, family and social history reviewed and updated as indicated. Interim medical history since our last visit reviewed. Allergies and medications reviewed and updated.  Review of Systems  Constitutional:  Positive for fever.  Cardiovascular:  Positive for leg swelling.  Skin:  Positive for wound.       Denies discharge    Per HPI unless specifically indicated above     Objective:    BP 103/62   Pulse 81   Temp (!) 97.3 F (36.3 C) (Oral)   Wt 264 lb 12.8 oz (120.1 kg)   SpO2 98%   BMI 33.10 kg/m   Wt Readings from Last 3 Encounters:  01/16/22 264 lb 12.8 oz (120.1 kg)  12/30/21 270 lb (122.5 kg)  12/22/21 273 lb 4.8 oz (124 kg)    Physical Exam Vitals and nursing note reviewed.  Constitutional:      General: He is not in acute distress.    Appearance: Normal appearance. He is not ill-appearing, toxic-appearing or diaphoretic.  HENT:     Head: Normocephalic.     Right Ear: External ear normal.     Left Ear: External ear normal.     Nose: Nose normal. No congestion or rhinorrhea.     Mouth/Throat:     Mouth: Mucous membranes are moist.  Eyes:     General:        Right eye: No discharge.        Left eye: No discharge.     Extraocular Movements: Extraocular movements intact.      Conjunctiva/sclera: Conjunctivae normal.     Pupils: Pupils are equal, round, and reactive to light.  Cardiovascular:     Rate and Rhythm: Normal rate and regular rhythm.     Heart sounds: No murmur heard. Pulmonary:     Effort: Pulmonary effort is normal. No respiratory distress.     Breath sounds: Normal breath sounds. No wheezing, rhonchi or rales.  Abdominal:     General: Abdomen is flat. Bowel sounds are normal.  Musculoskeletal:     Cervical back: Normal range of motion and neck supple.       Feet:  Feet:     Left foot:     Skin integrity: Warmth present. No erythema.  Skin:    General: Skin is warm and dry.     Capillary Refill: Capillary refill takes less than 2 seconds.  Neurological:     General: No focal deficit present.     Mental Status: He is alert and oriented to person, place, and time.  Psychiatric:  Mood and Affect: Mood normal.        Behavior: Behavior normal.        Thought Content: Thought content normal.        Judgment: Judgment normal.     Results for orders placed or performed in visit on 12/26/21  HM DIABETES EYE EXAM  Result Value Ref Range   HM Diabetic Eye Exam No Retinopathy No Retinopathy      Assessment & Plan:   Problem List Items Addressed This Visit   None Visit Diagnoses     Puncture wound    -  Primary   Secondary to nail. Will cover with Bactrim due to swelling and warmth in foot. Follow up in 3 days for reevaluation.  Discussed proper wound care.   Relevant Orders   Td : Tetanus/diphtheria >7yo Preservative  free (Completed)        Follow up plan: Return in about 3 days (around 01/19/2022) for Foot wound check with myself or Jolene.

## 2022-01-19 ENCOUNTER — Ambulatory Visit: Payer: Medicare Other | Admitting: Nurse Practitioner

## 2022-01-23 ENCOUNTER — Other Ambulatory Visit: Payer: Medicare Other

## 2022-03-20 ENCOUNTER — Other Ambulatory Visit: Payer: Self-pay

## 2022-03-20 ENCOUNTER — Ambulatory Visit (INDEPENDENT_AMBULATORY_CARE_PROVIDER_SITE_OTHER): Payer: Medicare Other

## 2022-03-20 VITALS — Ht 75.5 in | Wt 264.0 lb

## 2022-03-20 DIAGNOSIS — Z1211 Encounter for screening for malignant neoplasm of colon: Secondary | ICD-10-CM

## 2022-03-20 DIAGNOSIS — Z0000 Encounter for general adult medical examination without abnormal findings: Secondary | ICD-10-CM

## 2022-03-20 DIAGNOSIS — Z86010 Personal history of colonic polyps: Secondary | ICD-10-CM

## 2022-03-20 MED ORDER — GOLYTELY 236 G PO SOLR: 4000.0000 mL | Freq: Two times a day (BID) | ORAL | 0 refills | Status: DC

## 2022-03-20 NOTE — Addendum Note (Signed)
Addended by: Vanetta Mulders on: 03/20/2022 02:20 PM   Modules accepted: Orders

## 2022-03-20 NOTE — Patient Instructions (Signed)
Derek Wright , Thank you for taking time to come for your Medicare Wellness Visit. I appreciate your ongoing commitment to your health goals. Please review the following plan we discussed and let me know if I can assist you in the future.   These are the goals we discussed:  Goals      DIET - EAT MORE FRUITS AND VEGETABLES     DIET - INCREASE WATER INTAKE     Recommend drinking at least 6-8 glasses of water a day      Increase water intake     Recommend drinking at least 4-5 glasses of water a day     Patient Stated     01/26/2020, no goals     Patient Stated     Increase physical activity         This is a list of the screening recommended for you and due dates:  Health Maintenance  Topic Date Due   COVID-19 Vaccine (5 - 2023-24 season) 02/05/2022   Zoster (Shingles) Vaccine (1 of 2) 03/24/2022*   Hemoglobin A1C  06/22/2022   Eye exam for diabetics  12/16/2022   Yearly kidney function blood test for diabetes  12/23/2022   Yearly kidney health urinalysis for diabetes  12/23/2022   Complete foot exam   12/23/2022   Colon Cancer Screening  12/31/2022   Medicare Annual Wellness Visit  03/21/2023   DTaP/Tdap/Td vaccine (3 - Td or Tdap) 01/17/2032   Pneumonia Vaccine  Completed   Flu Shot  Completed   Hepatitis C Screening: USPSTF Recommendation to screen - Ages 75-79 yo.  Completed   HPV Vaccine  Aged Out  *Topic was postponed. The date shown is not the original due date.    Advanced directives: no  Conditions/risks identified: none  Next appointment: Follow up in one year for your annual wellness visit. 03/26/23 @ 8:15 am by phone  Preventive Care 65 Years and Older, Male  Preventive care refers to lifestyle choices and visits with your health care provider that can promote health and wellness. What does preventive care include? A yearly physical exam. This is also called an annual well check. Dental exams once or twice a year. Routine eye exams. Ask your health care  provider how often you should have your eyes checked. Personal lifestyle choices, including: Daily care of your teeth and gums. Regular physical activity. Eating a healthy diet. Avoiding tobacco and drug use. Limiting alcohol use. Practicing safe sex. Taking low doses of aspirin every day. Taking vitamin and mineral supplements as recommended by your health care provider. What happens during an annual well check? The services and screenings done by your health care provider during your annual well check will depend on your age, overall health, lifestyle risk factors, and family history of disease. Counseling  Your health care provider may ask you questions about your: Alcohol use. Tobacco use. Drug use. Emotional well-being. Home and relationship well-being. Sexual activity. Eating habits. History of falls. Memory and ability to understand (cognition). Work and work Statistician. Screening  You may have the following tests or measurements: Height, weight, and BMI. Blood pressure. Lipid and cholesterol levels. These may be checked every 5 years, or more frequently if you are over 32 years old. Skin check. Lung cancer screening. You may have this screening every year starting at age 41 if you have a 30-pack-year history of smoking and currently smoke or have quit within the past 15 years. Fecal occult blood test (FOBT) of the  stool. You may have this test every year starting at age 45. Flexible sigmoidoscopy or colonoscopy. You may have a sigmoidoscopy every 5 years or a colonoscopy every 10 years starting at age 17. Prostate cancer screening. Recommendations will vary depending on your family history and other risks. Hepatitis C blood test. Hepatitis B blood test. Sexually transmitted disease (STD) testing. Diabetes screening. This is done by checking your blood sugar (glucose) after you have not eaten for a while (fasting). You may have this done every 1-3 years. Abdominal aortic  aneurysm (AAA) screening. You may need this if you are a current or former smoker. Osteoporosis. You may be screened starting at age 38 if you are at high risk. Talk with your health care provider about your test results, treatment options, and if necessary, the need for more tests. Vaccines  Your health care provider may recommend certain vaccines, such as: Influenza vaccine. This is recommended every year. Tetanus, diphtheria, and acellular pertussis (Tdap, Td) vaccine. You may need a Td booster every 10 years. Zoster vaccine. You may need this after age 77. Pneumococcal 13-valent conjugate (PCV13) vaccine. One dose is recommended after age 78. Pneumococcal polysaccharide (PPSV23) vaccine. One dose is recommended after age 91. Talk to your health care provider about which screenings and vaccines you need and how often you need them. This information is not intended to replace advice given to you by your health care provider. Make sure you discuss any questions you have with your health care provider. Document Released: 02/26/2015 Document Revised: 10/20/2015 Document Reviewed: 12/01/2014 Elsevier Interactive Patient Education  2017 [place] Prevention in the Home Falls can cause injuries. They can happen to people of all ages. There are many things you can do to make your home safe and to help prevent falls. What can I do on the outside of my home? Regularly fix the edges of walkways and driveways and fix any cracks. Remove anything that might make you trip as you walk through a door, such as a raised step or threshold. Trim any bushes or trees on the path to your home. Use bright outdoor lighting. Clear any walking paths of anything that might make someone trip, such as rocks or tools. Regularly check to see if handrails are loose or broken. Make sure that both sides of any steps have handrails. Any raised decks and porches should have guardrails on the edges. Have any leaves,  snow, or ice cleared regularly. Use sand or salt on walking paths during winter. Clean up any spills in your garage right away. This includes oil or grease spills. What can I do in the bathroom? Use night lights. Install grab bars by the toilet and in the tub and shower. Do not use towel bars as grab bars. Use non-skid mats or decals in the tub or shower. If you need to sit down in the shower, use a plastic, non-slip stool. Keep the floor dry. Clean up any water that spills on the floor as soon as it happens. Remove soap buildup in the tub or shower regularly. Attach bath mats securely with double-sided non-slip rug tape. Do not have throw rugs and other things on the floor that can make you trip. What can I do in the bedroom? Use night lights. Make sure that you have a light by your bed that is easy to reach. Do not use any sheets or blankets that are too big for your bed. They should not hang down onto the floor.  Have a firm chair that has side arms. You can use this for support while you get dressed. Do not have throw rugs and other things on the floor that can make you trip. What can I do in the kitchen? Clean up any spills right away. Avoid walking on wet floors. Keep items that you use a lot in easy-to-reach places. If you need to reach something above you, use a strong step stool that has a grab bar. Keep electrical cords out of the way. Do not use floor polish or wax that makes floors slippery. If you must use wax, use non-skid floor wax. Do not have throw rugs and other things on the floor that can make you trip. What can I do with my stairs? Do not leave any items on the stairs. Make sure that there are handrails on both sides of the stairs and use them. Fix handrails that are broken or loose. Make sure that handrails are as long as the stairways. Check any carpeting to make sure that it is firmly attached to the stairs. Fix any carpet that is loose or worn. Avoid having throw  rugs at the top or bottom of the stairs. If you do have throw rugs, attach them to the floor with carpet tape. Make sure that you have a light switch at the top of the stairs and the bottom of the stairs. If you do not have them, ask someone to add them for you. What else can I do to help prevent falls? Wear shoes that: Do not have high heels. Have rubber bottoms. Are comfortable and fit you well. Are closed at the toe. Do not wear sandals. If you use a stepladder: Make sure that it is fully opened. Do not climb a closed stepladder. Make sure that both sides of the stepladder are locked into place. Ask someone to hold it for you, if possible. Clearly mark and make sure that you can see: Any grab bars or handrails. First and last steps. Where the edge of each step is. Use tools that help you move around (mobility aids) if they are needed. These include: Canes. Walkers. Scooters. Crutches. Turn on the lights when you go into a dark area. Replace any light bulbs as soon as they burn out. Set up your furniture so you have a clear path. Avoid moving your furniture around. If any of your floors are uneven, fix them. If there are any pets around you, be aware of where they are. Review your medicines with your doctor. Some medicines can make you feel dizzy. This can increase your chance of falling. Ask your doctor what other things that you can do to help prevent falls. This information is not intended to replace advice given to you by your health care provider. Make sure you discuss any questions you have with your health care provider. Document Released: 11/26/2008 Document Revised: 07/08/2015 Document Reviewed: 03/06/2014 Elsevier Interactive Patient Education  2017 [employer].

## 2022-03-20 NOTE — Telephone Encounter (Signed)
Received new referral for patient to schedule his repeat colonoscopy with Dr. Vicente Males.  Dr. Vicente Males noted for insurance purposes Colonoscopy should be repeated in 1 year with 2 day prep.  Colonoscopy has been scheduled for 01/01/23 with Dr. Vicente Males at [facility].  Golytely 2 Day Prep Instructions:  At [facility] on 12/30/22 Begin Clear Liquid Diet. At 5pm on 12/30/22 Fill 1st Golytely Prep to the fill line with clear liquid. Drink 8 oz every 15-30 mins until half has been completed. On 12/31/22 Resume drinking the remainder of Golytely prep.  Drinking 8 oz every 15-30 minutes until entire contents have been completed. 5 hours prior to procedure date on 01/01/23 Fill 2nd container of Golytely to the Fill line.  Mix well with clear liquid.  Drink 8 oz every 15-30 minutes until half has been completed.  Be sure to finish 2 hours before procedure time.

## 2022-03-20 NOTE — Progress Notes (Signed)
Virtual Visit via Telephone Note  I connected with  Derek Wright on 03/20/22 at  8:15 AM EST by telephone and verified that I am speaking with the correct person using two identifiers.  Location: Patient: home Provider: [facility] Persons participating in the virtual visit: patient/Nurse Health Advisor   I discussed the limitations, risks, security and privacy concerns of performing an evaluation and management service by telephone and the availability of in person appointments. The patient expressed understanding and agreed to proceed.  Interactive audio and video telecommunications were attempted between this nurse and patient, however failed, due to patient having technical difficulties OR patient did not have access to video capability.  We continued and completed visit with audio only.  Some vital signs may be absent or patient reported.   Dionisio David, LPN  Subjective:   Derek Wright is a 77 y.o. male who presents for Medicare Annual/Subsequent preventive examination.  Review of Systems     Cardiac Risk Factors include: advanced age (>64mn, >>20women);male gender;hypertension     Objective:    Today's Vitals   03/20/22 0834  Weight: 264 lb (119.7 kg)  Height: 6' 3.5" (1.918 m)   Body mass index is 32.56 kg/m.     03/20/2022    8:23 AM 12/30/2021    6:49 AM 07/18/2021   11:54 AM 01/26/2021    1:01 PM 10/15/2020    3:11 PM 07/20/2020   10:25 AM 01/26/2020    2:30 PM  Advanced Directives  Does Patient Have a Medical Advance Directive? No Yes Yes No No Yes Yes  Type of ASocial research officer, governmentLiving will     [place]  Does patient want to make changes to medical advance directive?   No - Patient declined      Copy of [place] Chart?  No - copy requested     No - copy requested  Would patient like information on creating a medical advance directive? No - Patient declined   No - Patient declined No  - Patient declined      Current Medications (verified) Outpatient Encounter Medications as of 03/20/2022  Medication Sig   Aspirin-Acetaminophen-Caffeine (BC FAST PAIN RELIEF MAX STR) 500-500-65 MG PACK Take 1 packet by mouth daily as needed (pain.).   atorvastatin (LIPITOR) 40 MG tablet Take 1 tablet (40 mg total) by mouth daily.   Blood Glucose Monitoring Suppl (ACCU-CHEK AVIVA PLUS) w/Device KIT 1 each by Does not apply route 2 (two) times daily.   clobetasol cream (TEMOVATE) 01.54% Apply 1 application. topically 2 (two) times daily.   glucose blood (ACCU-CHEK AVIVA PLUS) test strip 1 each by Other route 2 (two) times daily.   isosorbide mononitrate (IMDUR) 30 MG 24 hr tablet Take 30 mg by mouth daily.   metoprolol succinate (TOPROL-XL) 25 MG 24 hr tablet TAKE ONE TABLET DAILY BY MOUTH.   Multiple Vitamin (MULTIVITAMIN WITH MINERALS) TABS tablet Take 1 tablet by mouth 2 (two) times a week. One-A-Day   omeprazole (PRILOSEC) 40 MG capsule Take 1 capsule (40 mg total) by mouth in the morning.   QC LO-DOSE ASPIRIN 81 MG tablet Take 81 mg by mouth daily.   acetaminophen (TYLENOL) 500 MG tablet Take 500-1,000 mg by mouth every 6 (six) hours as needed (for pain.). (Patient not taking: Reported on 03/20/2022)   sulfamethoxazole-trimethoprim (BACTRIM DS) 800-160 MG tablet Take 1 tablet by mouth 2 (two) times daily. (Patient not  taking: Reported on 03/20/2022)   No facility-administered encounter medications on file as of 03/20/2022.    Allergies (verified) Patient has no known allergies.   History: Past Medical History:  Diagnosis Date   Arthritis    GERD (gastroesophageal reflux disease)    RARE-NO MEDS   Hyperlipidemia    Hypertension    Pre-diabetes    Sleep apnea    MILD-NO CPAP-COULD NOT TOELERATE   Status post surgical removal of nail matrix of toe of left foot    Status post surgical removal of nail matrix of toe of right foot    Past Surgical History:  Procedure Laterality Date    COLONOSCOPY  2012   COLONOSCOPY WITH PROPOFOL N/A 07/20/2020   Procedure: COLONOSCOPY WITH PROPOFOL;  Surgeon: Virgel Manifold, MD;  Location: [facility] ENDOSCOPY;  Service: Endoscopy;  Laterality: N/A;   COLONOSCOPY WITH PROPOFOL N/A 12/30/2021   Procedure: COLONOSCOPY WITH PROPOFOL;  Surgeon: [name], MD;  Location: [facility] ENDOSCOPY;  Service: Gastroenterology;  Laterality: N/A;   hair folicle removed     HERNIA REPAIR     INGUINAL HERNIA REPAIR Bilateral 09/29/2015   Procedure: LAPAROSCOPIC BILATERAL INGUINAL HERNIA REPAIR;  Surgeon: Robert Bellow, MD;  Location: [facility] ORS;  Service: General;  Laterality: Bilateral;   LEFT HEART CATH AND CORONARY ANGIOGRAPHY Left 07/18/2021   Procedure: LEFT HEART CATH AND CORONARY ANGIOGRAPHY;  Surgeon: Yolonda Kida, MD;  Location: [facility];  Service: Cardiovascular;  Laterality: Left;   SKIN GRAFT Right 1966   hand   Family History  Problem Relation Age of Onset   Ovarian cancer Mother    Heart disease Father    Lymphoma Maternal Uncle    Social History   Socioeconomic History   Marital status: Married    Spouse name: Not on file   Number of children: Not on file   Years of education: 12   Highest education level: High school graduate  Occupational History   Not on file  Tobacco Use   Smoking status: Former    Packs/day: 0.25    Years: 15.00    Total pack years: 3.75    Types: Cigarettes    Quit date: 09/13/1980    Years since quitting: 41.5   Smokeless tobacco: Never  Vaping Use   Vaping Use: Never used  Substance and Sexual Activity   Alcohol use: Not Currently    Comment: on occasion   Drug use: No   Sexual activity: Not on file  Other Topics Concern   Not on file  Social History Narrative   Not on file   Social Determinants of Health   Financial Resource Strain: Low Risk  (03/20/2022)   Overall Financial Resource Strain (CARDIA)    Difficulty of Paying Living Expenses: Not hard at all  Food Insecurity: No  Food Insecurity (03/20/2022)   Hunger Vital Sign    Worried About Running Out of Food in the Last Year: Never true    [place] in the Last Year: Never true  Transportation Needs: No Transportation Needs (03/20/2022)   PRAPARE - Hydrologist (Medical): No    Lack of Transportation (Non-Medical): No  Physical Activity: Insufficiently Active (03/20/2022)   Exercise Vital Sign    Days of Exercise per Week: 2 days    Minutes of Exercise per Session: 20 min  Stress: No Stress Concern Present (03/20/2022)   [place]  Feeling of Stress : Not at all  Social Connections: Moderately Integrated (03/20/2022)   Social Connection and Isolation Panel [NHANES]    Frequency of Communication with Friends and Family: Once a week    Frequency of Social Gatherings with Friends and Family: Twice a week    Attends Religious Services: More than 4 times per year    Active Member of [employer] or Organizations: No    Attends Music therapist: Never    Marital Status: Married    Tobacco Counseling Counseling given: Not Answered   Clinical Intake:  Pre-visit preparation completed: Yes  Pain : No/denies pain     Nutritional Risks: None Diabetes: No  How often do you need to have someone help you when you read instructions, pamphlets, or other written materials from your doctor or pharmacy?: 1 - Never  Diabetic?no  Interpreter Needed?: No  Information entered by :: Kirke Shaggy, LPN   Activities of Daily Living    03/20/2022    8:24 AM 12/22/2021    9:02 AM  In your present state of health, do you have any difficulty performing the following activities:  Hearing? 0 0  Vision? 0 0  Difficulty concentrating or making decisions? 0 0  Walking or climbing stairs? 0 0  Dressing or bathing? 0 0  Doing errands, shopping? 0 0  Preparing Food and eating ? N   Using the Toilet? N   In the past  six months, have you accidently leaked urine? N   Do you have problems with loss of bowel control? N   Managing your Medications? N   Managing your Finances? N   Housekeeping or managing your Housekeeping? N     Patient Care Team: Venita Lick, NP as PCP - General (Nurse Practitioner) Kem Parkinson, MD (Ophthalmology) Sharene Butters, MD (General Surgery) Yolonda Kida, MD as Consulting Physician (Cardiology)  Indicate any recent Medical Services you may have received from other than [facility] providers in the past year (date may be approximate).     Assessment:   This is a routine wellness examination for Derek Wright.  Hearing/Vision screen Hearing Screening - Comments:: No aids Vision Screening - Comments:: Wears glasses- [facility]  Dietary issues and exercise activities discussed: Current Exercise Habits: Home exercise routine, Type of exercise: walking, Time (Minutes): 20, Frequency (Times/Week): 2, Weekly Exercise (Minutes/Week): 40, Intensity: Mild   Goals Addressed             This Visit's Progress    DIET - EAT MORE FRUITS AND VEGETABLES         Depression Screen    03/20/2022    8:22 AM 12/22/2021    8:58 AM 08/24/2021    4:23 PM 06/20/2021    8:11 AM 05/12/2021    9:58 AM 01/26/2021    1:28 PM 12/20/2020    8:08 AM  PHQ 2/9 Scores  PHQ - 2 Score 0 0 0 0 0 0 0  PHQ- 9 Score 0 0 0 0 0 0 0    Fall Risk    03/20/2022    8:24 AM 12/22/2021    9:02 AM 08/24/2021    4:23 PM 06/20/2021    8:11 AM 01/26/2021    1:01 PM  Fall Risk   Falls in the past year? 1 0 0 0 0  Number falls in past yr: 1 0 0 0 0  Injury with Fall? 0 0 0 0 0  Risk for fall due to :  History of fall(s) No Fall Risks No Fall Risks No Fall Risks   Follow up Falls evaluation completed;Falls prevention discussed Falls prevention discussed Falls evaluation completed Falls evaluation completed Falls evaluation completed;Falls prevention discussed    FALL RISK PREVENTION PERTAINING TO THE  HOME:  Any stairs in or around the home? Yes  If so, are there any without handrails? No  Home free of loose throw rugs in walkways, pet beds, electrical cords, etc? Yes  Adequate lighting in your home to reduce risk of falls? Yes   ASSISTIVE DEVICES UTILIZED TO PREVENT FALLS:  Life alert? No  Use of a cane, walker or w/c? No  Grab bars in the bathroom? No  Shower chair or bench in shower? Yes  Elevated toilet seat or a handicapped toilet? No   Cognitive Function:        03/20/2022    8:30 AM 12/22/2021    9:21 AM 12/20/2020    8:42 AM 01/26/2020    2:33 PM 12/12/2019    9:44 AM  6CIT Screen  What Year? 0 points 0 points 0 points 0 points 0 points  What month? 0 points 0 points 0 points 0 points 0 points  What time? 0 points 0 points 0 points 0 points 0 points  Count back from 20 0 points 0 points 0 points 0 points 0 points  Months in reverse 0 points 0 points 0 points 0 points 0 points  Repeat phrase 2 points 0 points 0 points 0 points 0 points  Total Score 2 points 0 points 0 points 0 points 0 points    Immunizations Immunization History  Administered Date(s) Administered   Fluad Quad(high Dose 65+) 10/09/2018, 10/27/2019, 11/14/2019   Influenza,inj,Quad PF,6+ Mos 11/21/2016   Influenza,inj,quad, With Preservative 12/01/2016   Influenza-Unspecified 11/12/2013, 12/29/2014, 11/01/2015, 11/14/2017, 11/13/2020, 12/11/2021   Moderna Sars-Covid-2 Vaccination 03/28/2019, 05/03/2019, 12/11/2021   PFIZER(Purple Top)SARS-COV-2 Vaccination 12/23/2019   Pneumococcal Conjugate-13 09/13/2015   Pneumococcal Polysaccharide-23 03/25/2009, 08/20/2017   Pneumococcal-Unspecified 03/25/2009   Td 01/16/2022   Tdap 05/08/2013   Zoster, Live 09/25/2011    TDAP status: Up to date  Flu Vaccine status: Up to date  Pneumococcal vaccine status: Up to date  Covid-19 vaccine status: Completed vaccines  Qualifies for Shingles Vaccine? Yes   Zostavax completed Yes   Shingrix Completed?:  No.    Education has been provided regarding the importance of this vaccine. Patient has been advised to call insurance company to determine out of pocket expense if they have not yet received this vaccine. Advised may also receive vaccine at local pharmacy or Health Dept. Verbalized acceptance and understanding.  Screening Tests Health Maintenance  Topic Date Due   COVID-19 Vaccine (5 - 2023-24 season) 02/05/2022   Zoster Vaccines- Shingrix (1 of 2) 03/24/2022 (Originally 07/19/1964)   HEMOGLOBIN A1C  06/22/2022   OPHTHALMOLOGY EXAM  12/16/2022   Diabetic kidney evaluation - eGFR measurement  12/23/2022   Diabetic kidney evaluation - Urine ACR  12/23/2022   FOOT EXAM  12/23/2022   COLONOSCOPY (Pts 45-3yr Insurance coverage will need to be confirmed)  12/31/2022   Medicare Annual Wellness (AWV)  03/21/2023   DTaP/Tdap/Td (3 - Td or Tdap) 01/17/2032   Pneumonia Vaccine 75 Years old  Completed   INFLUENZA VACCINE  Completed   Hepatitis C Screening  Completed   HPV VACCINES  Aged Out    Health Maintenance  Health Maintenance Due  Topic Date Due   COVID-19 Vaccine (5 - 2023-24 season) 02/05/2022  Colorectal cancer screening: Type of screening: Colonoscopy. Completed 12/30/21. Repeat every 5 years  Lung Cancer Screening: (Low Dose CT Chest recommended if Age 58-80 years, 30 pack-year currently smoking OR have quit w/in 15years.) does not qualify.   Additional Screening:  Hepatitis C Screening: does qualify; Completed 09/13/15  Vision Screening: Recommended annual ophthalmology exams for early detection of glaucoma and other disorders of the eye. Is the patient up to date with their annual eye exam?  Yes  Who is the provider or what is the name of the office in which the patient attends annual eye exams? [facility] If pt is not established with a provider, would they like to be referred to a provider to establish care? No .   Dental Screening: Recommended annual dental exams  for proper oral hygiene  Community Resource Referral / Chronic Care Management: CRR required this visit?  No   CCM required this visit?  No      Plan:     I have personally reviewed and noted the following in the patient's chart:   Medical and social history Use of alcohol, tobacco or illicit drugs  Current medications and supplements including opioid prescriptions. Patient is not currently taking opioid prescriptions. Functional ability and status Nutritional status Physical activity Advanced directives List of other physicians Hospitalizations, surgeries, and ER visits in previous 12 months Vitals Screenings to include cognitive, depression, and falls Referrals and appointments  In addition, I have reviewed and discussed with patient certain preventive protocols, quality metrics, and best practice recommendations. A written personalized care plan for preventive services as well as general preventive health recommendations were provided to patient.     Dionisio David, LPN   02/15/8784   Nurse Notes: none

## 2022-05-17 DIAGNOSIS — E119 Type 2 diabetes mellitus without complications: Secondary | ICD-10-CM | POA: Diagnosis not present

## 2022-05-17 DIAGNOSIS — E782 Mixed hyperlipidemia: Secondary | ICD-10-CM | POA: Diagnosis not present

## 2022-05-17 DIAGNOSIS — I700 Atherosclerosis of aorta: Secondary | ICD-10-CM | POA: Diagnosis not present

## 2022-05-17 DIAGNOSIS — I10 Essential (primary) hypertension: Secondary | ICD-10-CM | POA: Diagnosis not present

## 2022-05-17 DIAGNOSIS — K219 Gastro-esophageal reflux disease without esophagitis: Secondary | ICD-10-CM | POA: Diagnosis not present

## 2022-05-17 DIAGNOSIS — G4733 Obstructive sleep apnea (adult) (pediatric): Secondary | ICD-10-CM | POA: Diagnosis not present

## 2022-05-17 DIAGNOSIS — I498 Other specified cardiac arrhythmias: Secondary | ICD-10-CM | POA: Diagnosis not present

## 2022-06-18 NOTE — Patient Instructions (Incomplete)
Try reducing Omeprazole (heart burn medication) to every other day for two weeks and then every third day for two weeks -- continue to reduce slowly to the lowest amount you need to take it.  Angina  Angina is discomfort or pain in the chest, neck, arm, jaw, or back. The discomfort is caused by a lack of blood in the middle layer of the heart wall. The middle layer of the heart wall is called the myocardium. What are the causes? This condition is caused by a buildup of fat and cholesterol, or plaque, in your arteries. This buildup narrows the arteries and makes it hard for blood to flow. What increases the risk? Main risks High levels of cholesterol in your blood. High blood pressure. Diabetes. Family history of heart disease. Not exercising or moving enough. Depression. Having had radiation treatment on the left side of your chest. Other risks Tobacco use. Too much body weight (obesity). A diet that is high in unhealthy fats (saturated fats). Stress. Using drugs, such as cocaine. Risks for women Being older than 55 years. Being in menopause. This is the time when a woman no longer has a menstrual period. What are the signs or symptoms? Symptoms in all people Chest pain, which may: Feel like a crushing or squeezing in the chest. Feel like a tightness, pressure, fullness, or heaviness in the chest. Last for more than a few minutes at a time. Stop and come back (recur) after a few minutes. Pain in the neck, arm, jaw, or back. Heartburn or upset stomach (indigestion) for no reason. Being short of breath. Feeling like you may vomit (nauseous). Sudden cold sweats. Symptoms in women and people with diabetes Tiredness. Worry and anxiety. Weakness. Dizziness or fainting. How is this treated? This condition may be treated with: Medicines. These can be given to prevent blood clots, stop a heart attack, lower blood pressure, or treat other risk factors. A procedure to widen a  narrowed or blocked artery in the heart. Surgery to allow blood to go around a blocked artery. Follow these instructions at home: Medicines Take over-the-counter and prescription medicines only as told by your doctor. Do not take these medicines unless your doctor says that you can: NSAIDs. These include: Ibuprofen. Naproxen. Vitamin supplements that have vitamin A, vitamin E, or both. Hormone therapy that contains estrogen with or without progestin. Eating and drinking  Eat a heart-healthy diet that includes: Lots of fresh fruits and vegetables. Whole grains. Low-fat (lean) protein. Low-fat dairy products. Follow instructions from your doctor about what you cannot eat or drink. Activity Follow an exercise program that your doctor tells you. Talk with your doctor about joining a program to make your heart strong again (cardiac rehab). When you feel tired, take a break. Plan breaks if you know you are going to feel tired. Lifestyle  Do not smoke or use any products that contain nicotine or tobacco. If you need help quitting, ask your doctor. If your doctor says you can drink alcohol: Limit how much you have to: 0-1 drink a day for women who are not pregnant. 0-2 drinks a day for men. Know how much alcohol is in your drink. In the [place] one drink equals one 12 oz bottle of beer (355 mL), one 5 oz glass of wine (148 mL), or one 1 oz glass of hard liquor (44 mL). General instructions Stay at a healthy weight. If told to lose weight, work with your doctor to do lose weight safely. Learn to manage  stress. If you need help, ask your doctor. Keep your vaccines up to date. Get a flu shot every year. Talk with your doctor if you feel sad. Take a screening test to see if you are at risk for depression. Work with your doctor to manage any other health problems that you have. These may include diabetes or high blood pressure. Keep all follow-up visits. Get help right away if: You have pain  in your chest, neck, arm, jaw, or back, and the pain: Lasts more than a few minutes. Comes back. Does not get better after you take medicine under your tongue (sublingual nitroglycerin). Keeps getting worse. Comes more often. You have any of these problems: Sweating a lot. Heartburn or upset stomach. Shortness of breath. Trouble breathing. Feeling like you may vomit. Vomiting. Feeling more tired than normal. Feeling nervous or worrying more than normal. Weakness. You are dizzy or light-headed all of a sudden. You faint. These symptoms may be an emergency. Get help right away. Call your local emergency services (911 in the [place] Do not wait to see if the symptoms will go away. Do not drive yourself to the hospital. Summary Angina is discomfort or pain in the chest, neck, arm, neck, or back. Symptoms include chest pain, heartburn or upset stomach, and shortness of breath. Women or people with diabetes may have other symptoms, such as feeling nervous, being worried, or being weak or tired. Take all medicines only as told by your doctor. You should eat a heart-healthy diet and follow an exercise program. This information is not intended to replace advice given to you by your health care provider. Make sure you discuss any questions you have with your health care provider. Document Revised: 07/25/2019 Document Reviewed: 07/25/2019 Elsevier Patient Education  2023 [employer].

## 2022-06-22 ENCOUNTER — Ambulatory Visit (INDEPENDENT_AMBULATORY_CARE_PROVIDER_SITE_OTHER): Payer: Medicare Other | Admitting: Nurse Practitioner

## 2022-06-22 ENCOUNTER — Encounter: Payer: Self-pay | Admitting: Nurse Practitioner

## 2022-06-22 VITALS — BP 110/67 | HR 56 | Temp 97.9°F | Ht 75.51 in | Wt 256.6 lb

## 2022-06-22 DIAGNOSIS — I10 Essential (primary) hypertension: Secondary | ICD-10-CM | POA: Diagnosis not present

## 2022-06-22 DIAGNOSIS — R7309 Other abnormal glucose: Secondary | ICD-10-CM | POA: Diagnosis not present

## 2022-06-22 DIAGNOSIS — I700 Atherosclerosis of aorta: Secondary | ICD-10-CM

## 2022-06-22 DIAGNOSIS — R12 Heartburn: Secondary | ICD-10-CM

## 2022-06-22 DIAGNOSIS — I209 Angina pectoris, unspecified: Secondary | ICD-10-CM | POA: Diagnosis not present

## 2022-06-22 DIAGNOSIS — E782 Mixed hyperlipidemia: Secondary | ICD-10-CM

## 2022-06-22 DIAGNOSIS — I739 Peripheral vascular disease, unspecified: Secondary | ICD-10-CM

## 2022-06-22 DIAGNOSIS — E6609 Other obesity due to excess calories: Secondary | ICD-10-CM

## 2022-06-22 DIAGNOSIS — Z6831 Body mass index (BMI) 31.0-31.9, adult: Secondary | ICD-10-CM

## 2022-06-22 DIAGNOSIS — R972 Elevated prostate specific antigen [PSA]: Secondary | ICD-10-CM

## 2022-06-22 LAB — MICROALBUMIN, URINE WAIVED
Creatinine, Urine Waived: 100 mg/dL (ref 10–300)
Microalb, Ur Waived: 30 mg/L — ABNORMAL HIGH (ref 0–19)
Microalb/Creat Ratio: <30 mg/g (ref <30)

## 2022-06-22 LAB — BAYER DCA HB A1C WAIVED: HB A1C (BAYER DCA - WAIVED): 6.3 % — ABNORMAL HIGH (ref 4.8–5.6)

## 2022-06-22 NOTE — Assessment & Plan Note (Signed)
Chronic, stable.  Diagnosed 07/18/21 with left heart cath 07/18/21.  Continue collaboration with cardiology.  No further CP reported.  Will continue medications as ordered by cardiology.

## 2022-06-22 NOTE — Assessment & Plan Note (Signed)
Chronic, ongoing, stable with Prilosec.  Continue current medication regimen and adjust as needed. Mag level next visit. Risks of PPI use were discussed with patient including bone loss, C. Diff diarrhea, pneumonia, infections, CKD, electrolyte abnormalities.  Verbalizes understanding and chooses to continue the medication.

## 2022-06-22 NOTE — Assessment & Plan Note (Signed)
Chronic, ongoing.  BP at goal for age today.  Continue current medication regimen + collaboration with cardiology and recommend checking BP daily at home and documenting + focus on DASH diet.  Labs today: CMP, urine ALB.  Urine ALB 13 Jul 2022, could consider adding ARB (no ACE, previous smoker) in future for BP and proteinuria discussed with him.  Return in 6 months.

## 2022-06-22 NOTE — Assessment & Plan Note (Signed)
Ongoing, noted on exam with decreased hair pattern, diminished pulses (1+), dry skin, edema.  Recommend use of compression hose daily, on during day and off at night.  Monitor skin for breakdown.  Apply lotion daily -- may need steroid cream in future.

## 2022-06-22 NOTE — Assessment & Plan Note (Signed)
Chronic, ongoing.  Continue daily statin and adjust dose as needed. Continue daily ASA 81 MG for prevention.  Return in 6 months.  Lipid panel today. 

## 2022-06-22 NOTE — Progress Notes (Signed)
BP 110/67   Pulse (!) 56   Temp 97.9 F (36.6 C) (Oral)   Ht 6' 3.51" (1.918 m)   Wt 256 lb 9.6 oz (116.4 kg)   SpO2 100%   BMI 31.64 kg/m    Subjective:    [name], male    DOB: February 06, 1946, 77 y.o.   MRN: [id number]  HPI: Derek Wright is a 77 y.o. male  Chief Complaint  Patient presents with   Hypertension   Hyperlipidemia   Gastroesophageal Reflux   Prediabetes   Recent labs in November noted mild trend up in PSA, however having no symptoms at this time.    HYPERTENSION/HLD Followed by Dr. Juliann Pares, last visit was 05/17/2022 -- to return in one year. Left heart catheterization on 07/18/21 due to angina pain -- continues ASA and Imdur. No further chest pain since that time.  Continues on Metoprolol XL and Atorvastatin for HLD.    Aortic atherosclerosis noted on CT imaging 08/17/17. Hypertension status: stable  Satisfied with current treatment? yes Duration of hypertension: chronic BP monitoring frequency:  not checking BP range:  BP medication side effects:  no Medication compliance: good compliance Aspirin: no Recurrent headaches: no Visual changes: no Palpitations: no Dyspnea: no Chest pain: no Lower extremity edema: yes, at baseline, no worse Dizzy/lightheaded: no  The 10-year ASCVD risk score (Arnett DK, et al., 2019) is: 25.4%   Values used to calculate the score:     Age: 28 years     Sex: Male     Is Non-Hispanic African American: Yes     Diabetic: Yes     Tobacco smoker: No     Systolic Blood Pressure: 110 mmHg     Is BP treated: Yes     HDL Cholesterol: 62 mg/dL     Total Cholesterol: 146 mg/dL  PREDIABETES: Last G6Y in November 6%.  He is currently diet controlled.   Polydipsia/polyuria: no Visual disturbance: no  Chest pain: no Paresthesias: no  GERD Continues on Omeprazole at home for GERD.  He has been working on diet and is reducing salt intake. GERD control status: stable Satisfied with current treatment? yes Heartburn  frequency: occasional Medication side effects: no  Medication compliance: stable Dysphagia: no Odynophagia:  no Hematemesis: no Blood in stool: no EGD: yes   Relevant past medical, surgical, family and social history reviewed and updated as indicated. Interim medical history since our last visit reviewed. Allergies and medications reviewed and updated.  Review of Systems  Constitutional:  Negative for activity change, diaphoresis, fatigue and fever.  Respiratory:  Negative for cough, chest tightness, shortness of breath and wheezing.   Cardiovascular:  Negative for chest pain, palpitations and leg swelling.  Gastrointestinal: Negative.   Endocrine: Negative for polydipsia, polyphagia and polyuria.  Neurological: Negative.   Psychiatric/Behavioral: Negative.      Per HPI unless specifically indicated above     Objective:    BP 110/67   Pulse (!) 56   Temp 97.9 F (36.6 C) (Oral)   Ht 6' 3.51" (1.918 m)   Wt 256 lb 9.6 oz (116.4 kg)   SpO2 100%   BMI 31.64 kg/m   Wt Readings from Last 3 Encounters:  06/22/22 256 lb 9.6 oz (116.4 kg)  03/20/22 264 lb (119.7 kg)  01/16/22 264 lb 12.8 oz (120.1 kg)    Physical Exam Vitals and nursing note reviewed.  Constitutional:      General: He is awake. He is not  in acute distress.    Appearance: He is well-developed. He is obese. He is not ill-appearing.  HENT:     Head: Normocephalic and atraumatic.     Right Ear: Hearing normal. No drainage.     Left Ear: Hearing normal. No drainage.  Eyes:     General: Lids are normal.        Right eye: No discharge.        Left eye: No discharge.     Conjunctiva/sclera: Conjunctivae normal.     Pupils: Pupils are equal, round, and reactive to light.  Neck:     Thyroid: No thyromegaly.     Vascular: No carotid bruit.  Cardiovascular:     Rate and Rhythm: Normal rate and regular rhythm.     Pulses:          Dorsalis pedis pulses are 1+ on the right side and 1+ on the left side.        Posterior tibial pulses are 1+ on the right side and 1+ on the left side.     Heart sounds: Normal heart sounds, S1 normal and S2 normal. No murmur heard.    No gallop.     Comments: Bilateral lower extremities with edema, decreased hair pattern, and dry skin. Pulmonary:     Effort: Pulmonary effort is normal. No accessory muscle usage or respiratory distress.     Breath sounds: Normal breath sounds.  Abdominal:     General: Bowel sounds are normal.     Palpations: Abdomen is soft.  Musculoskeletal:        General: Normal range of motion.     Cervical back: Normal range of motion and neck supple.     Right lower leg: 2+ Pitting Edema present.     Left lower leg: 2+ Pitting Edema present.  Feet:     Right foot:     Protective Sensation: 10 sites tested.  10 sites sensed.     Skin integrity: Dry skin present.     Left foot:     Protective Sensation: 10 sites tested.  10 sites sensed.     Skin integrity: Dry skin present.  Skin:    General: Skin is warm and dry.     Capillary Refill: Capillary refill takes less than 2 seconds.  Neurological:     Mental Status: He is alert and oriented to person, place, and time.  Psychiatric:        Attention and Perception: Attention normal.        Mood and Affect: Mood normal.        Behavior: Behavior normal. Behavior is cooperative.        Thought Content: Thought content normal.     Results for orders placed or performed in visit on 12/26/21  HM DIABETES EYE EXAM  Result Value Ref Range   HM Diabetic Eye Exam No Retinopathy No Retinopathy      Assessment & Plan:   Problem List Items Addressed This Visit       Cardiovascular and Mediastinum   Angina pectoris (HCC) - Primary (Chronic)    Chronic, stable.  Diagnosed 07/18/21 with left heart cath 07/18/21.  Continue collaboration with cardiology.  No further CP reported.  Will continue medications as ordered by cardiology.      Relevant Orders   Comprehensive metabolic panel   Lipid  Panel w/o Chol/HDL Ratio   Atherosclerosis of aorta (HCC) (Chronic)    Chronic.  Noted on CT 08/17/17, recommend continued  use of statin daily and daily Baby ASA 81 MG for prevention.  Recommend continued cessation of smoking, he quit in 1982.      Relevant Orders   Comprehensive metabolic panel   Lipid Panel w/o Chol/HDL Ratio   HTN (hypertension) (Chronic)    Chronic, ongoing.  BP at goal for age today.  Continue current medication regimen + collaboration with cardiology and recommend checking BP daily at home and documenting + focus on DASH diet.  Labs today: CMP, urine ALB.  Urine ALB 13 Jul 2022, could consider adding ARB (no ACE, previous smoker) in future for BP and proteinuria discussed with him.  Return in 6 months.      Relevant Orders   Comprehensive metabolic panel   PVD (peripheral vascular disease) (HCC)    Ongoing, noted on exam with decreased hair pattern, diminished pulses (1+), dry skin, edema.  Recommend use of compression hose daily, on during day and off at night.  Monitor skin for breakdown.  Apply lotion daily -- may need steroid cream in future.        Other   Elevated hemoglobin A1c (Chronic)    A1c trend up today to 6.3%, continue diet focus at home.  Urine ALB 13 Jul 2022, could consider addition of ARB if ongoing and if BP can tolerate -- no ACE, previous smoker.  Discussed importance of monitoring diet closely + regular exercise.      Relevant Orders   Bayer DCA Hb A1c Waived   Microalbumin, Urine Waived   Heart burn (Chronic)    Chronic, ongoing, stable with Prilosec.  Continue current medication regimen and adjust as needed. Mag level next visit. Risks of PPI use were discussed with patient including bone loss, C. Diff diarrhea, pneumonia, infections, CKD, electrolyte abnormalities.  Verbalizes understanding and chooses to continue the medication.       Hyperlipidemia (Chronic)    Chronic, ongoing.  Continue daily statin and adjust dose as needed.  Continue daily ASA 81 MG for prevention.  Return in 6 months.  Lipid panel today.      Relevant Orders   Comprehensive metabolic panel   Lipid Panel w/o Chol/HDL Ratio   Obesity    BMI 31.64, has been working on loss, down 8 pounds since last visit. Recommended eating smaller high protein, low fat meals more frequently and exercising 30 mins a day 5 times a week with a goal of 10-15lb weight loss in the next 3 months. Patient voiced their understanding and motivation to adhere to these recommendations.        Other Visit Diagnoses     Elevated PSA measurement       Recheck PSA today, trend up but in range for ethnicity and age.   Relevant Orders   PSA, total and free        Follow up plan: Return in about 6 months (around 12/23/2022) for Annual Physical -- after 12/23/2022.

## 2022-06-22 NOTE — Assessment & Plan Note (Signed)
BMI 31.64, has been working on loss, down 8 pounds since last visit. Recommended eating smaller high protein, low fat meals more frequently and exercising 30 mins a day 5 times a week with a goal of 10-15lb weight loss in the next 3 months. Patient voiced their understanding and motivation to adhere to these recommendations.

## 2022-06-22 NOTE — Assessment & Plan Note (Signed)
Chronic.  Noted on CT 08/17/17, recommend continued use of statin daily and daily Baby ASA 81 MG for prevention.  Recommend continued cessation of smoking, he quit in 1982. 

## 2022-06-22 NOTE — Assessment & Plan Note (Signed)
A1c trend up today to 6.3%, continue diet focus at home.  Urine ALB 13 Jul 2022, could consider addition of ARB if ongoing and if BP can tolerate -- no ACE, previous smoker.  Discussed importance of monitoring diet closely + regular exercise.

## 2022-06-23 ENCOUNTER — Telehealth: Payer: Self-pay | Admitting: Nurse Practitioner

## 2022-06-23 LAB — COMPREHENSIVE METABOLIC PANEL
ALT: 12 IU/L (ref 0–44)
AST: 16 IU/L (ref 0–40)
Albumin/Globulin Ratio: 1.5 (ref 1.2–2.2)
Albumin: 4 g/dL (ref 3.8–4.8)
Alkaline Phosphatase: 73 IU/L (ref 44–121)
BUN/Creatinine Ratio: 16 (ref 10–24)
BUN: 15 mg/dL (ref 8–27)
Bilirubin Total: 0.6 mg/dL (ref 0.0–1.2)
CO2: 23 mmol/L (ref 20–29)
Calcium: 8.9 mg/dL (ref 8.6–10.2)
Chloride: 107 mmol/L — ABNORMAL HIGH (ref 96–106)
Creatinine, Ser: 0.92 mg/dL (ref 0.76–1.27)
Globulin, Total: 2.6 g/dL (ref 1.5–4.5)
Glucose: 116 mg/dL — ABNORMAL HIGH (ref 70–99)
Potassium: 4.1 mmol/L (ref 3.5–5.2)
Sodium: 144 mmol/L (ref 134–144)
Total Protein: 6.6 g/dL (ref 6.0–8.5)
eGFR: 87 mL/min/{1.73_m2} (ref >59)

## 2022-06-23 LAB — PSA, TOTAL AND FREE
PSA, Free Pct: 10.8 %
PSA, Free: 0.43 ng/mL
Prostate Specific Ag, Serum: 4 ng/mL (ref 0.0–4.0)

## 2022-06-23 LAB — LIPID PANEL W/O CHOL/HDL RATIO
Cholesterol, Total: 158 mg/dL (ref 100–199)
HDL: 65 mg/dL (ref >39)
LDL Chol Calc (NIH): 83 mg/dL (ref 0–99)
Triglycerides: 49 mg/dL (ref 0–149)
VLDL Cholesterol Cal: 10 mg/dL (ref 5–40)

## 2022-06-23 NOTE — Progress Notes (Signed)
Good morning Derek Wright, please let Kaedan know his labs have returned: - Kidney and liver function are normal. - Cholesterol levels look great, continue statin. - Prostate lab work has trended back down to normal.  All good news!!  Any questions? Keep being amazing!!  Thank you for allowing me to participate in your care.  I appreciate you. Kindest regards, Yailine Ballard

## 2022-06-23 NOTE — Telephone Encounter (Unsigned)
Copied from CRM [id number]. Topic: General - Other >> Jun 23, 2022 11:44 AM Dondra Prader A wrote: Reason for CRM: Pt states that he seen his PCP yesterday and was told to stop taking one of his medications everyday and he forgot which medication. Pt states he think it is his acid reflux medication but he is not for sure. Please advise. Pt states if  you can't get him on his home number to please call his cell phone.

## 2022-06-26 NOTE — Telephone Encounter (Signed)
Patient made aware of Provider's recommendations and verbalized understanding.   

## 2022-10-13 ENCOUNTER — Ambulatory Visit (INDEPENDENT_AMBULATORY_CARE_PROVIDER_SITE_OTHER): Payer: Medicare Other | Admitting: Nurse Practitioner

## 2022-10-13 ENCOUNTER — Encounter: Payer: Self-pay | Admitting: Nurse Practitioner

## 2022-10-13 VITALS — BP 110/62 | HR 71 | Temp 98.0°F | Wt 264.8 lb

## 2022-10-13 DIAGNOSIS — M25511 Pain in right shoulder: Secondary | ICD-10-CM | POA: Diagnosis not present

## 2022-10-13 DIAGNOSIS — Z23 Encounter for immunization: Secondary | ICD-10-CM

## 2022-10-13 DIAGNOSIS — L989 Disorder of the skin and subcutaneous tissue, unspecified: Secondary | ICD-10-CM | POA: Diagnosis not present

## 2022-10-13 NOTE — Progress Notes (Signed)
BP 110/62 (BP Location: Left Arm, Patient Position: Sitting, Cuff Size: Large)   Pulse 71   Temp 98 F (36.7 C) (Oral)   Wt 264 lb 12.8 oz (120.1 kg)   SpO2 98%   BMI 32.65 kg/m    Subjective:    [name], male    DOB: [birth year], 77 y.o.   MRN: [id number]  HPI: Derek Wright is a 77 y.o. male  Chief Complaint  Patient presents with   Pain    Pt states he has been having pains in his hips, shoulders, knees, and ankles for the last couple of weeks. States he started feeling the pains after laying on the ground trying to fix a water leak for about 5 days.    Would like a referral to dermatology for skin exam and lesion removal to right face.  HIP PAIN Presents today for pain to multiple areas: hips, shoulders, knees, and ankles.  Was laying on the ground for 5 days trying to fix plumbing, laid on ground from in morning until noon.  Then the plumbing company came out and fixed it.  He reports pain is improving today -- this morning improved.   Duration: days Involved hip: bilateral  Mechanism of injury: unknown Location: diffuse Onset: gradual  Severity: 9/10 at worst, but today is a 2/10 -- mostly notices at night, calves will cramp up Quality: dull, aching, and throbbing Frequency: intermittent Radiation: no Aggravating factors: weight bearing, walking, and movement   Alleviating factors: BC powders and Tylenol XR  Status: better Treatments attempted: Tylenol XR and BC powders   Relief with NSAIDs?: mild Weakness with weight bearing: no Weakness with walking: no Paresthesias / decreased sensation: no Swelling: no Redness:no Fevers: no   Relevant past medical, surgical, family and social history reviewed and updated as indicated. Interim medical history since our last visit reviewed. Allergies and medications reviewed and updated.  Review of Systems  Constitutional:  Negative for activity change, diaphoresis, fatigue and fever.  Respiratory:  Negative  for cough, chest tightness, shortness of breath and wheezing.   Cardiovascular:  Negative for chest pain, palpitations and leg swelling.  Gastrointestinal: Negative.   Endocrine: Negative for polydipsia, polyphagia and polyuria.  Musculoskeletal:  Positive for arthralgias.  Neurological: Negative.   Psychiatric/Behavioral: Negative.      Per HPI unless specifically indicated above     Objective:    BP 110/62 (BP Location: Left Arm, Patient Position: Sitting, Cuff Size: Large)   Pulse 71   Temp 98 F (36.7 C) (Oral)   Wt 264 lb 12.8 oz (120.1 kg)   SpO2 98%   BMI 32.65 kg/m   Wt Readings from Last 3 Encounters:  10/13/22 264 lb 12.8 oz (120.1 kg)  06/22/22 256 lb 9.6 oz (116.4 kg)  03/20/22 264 lb (119.7 kg)    Physical Exam Vitals and nursing note reviewed.  Constitutional:      General: He is awake. He is not in acute distress.    Appearance: He is well-developed and well-groomed. He is obese. He is not ill-appearing or toxic-appearing.  HENT:     Head: Normocephalic.     Right Ear: Hearing and external ear normal.     Left Ear: Hearing and external ear normal.  Eyes:     General: Lids are normal.     Extraocular Movements: Extraocular movements intact.     Conjunctiva/sclera: Conjunctivae normal.  Neck:     Vascular: No carotid bruit.  Cardiovascular:  Rate and Rhythm: Normal rate and regular rhythm.     Heart sounds: Normal heart sounds. No murmur heard.    No gallop.  Pulmonary:     Effort: Pulmonary effort is normal. No accessory muscle usage or respiratory distress.     Breath sounds: Normal breath sounds. No decreased breath sounds, wheezing or rhonchi.  Abdominal:     General: Bowel sounds are normal. There is no distension.     Palpations: Abdomen is soft.     Tenderness: There is no abdominal tenderness.  Musculoskeletal:     Right shoulder: Normal.     Left shoulder: Normal.     Cervical back: Full passive range of motion without pain.     Right  hip: Normal.     Left hip: Normal.     Right lower leg: No edema.     Left lower leg: No edema.  Skin:    General: Skin is warm.     Capillary Refill: Capillary refill takes less than 2 seconds.  Neurological:     Mental Status: He is alert and oriented to person, place, and time.     Deep Tendon Reflexes: Reflexes are normal and symmetric.     Reflex Scores:      Brachioradialis reflexes are 2+ on the right side and 2+ on the left side.      Patellar reflexes are 2+ on the right side and 2+ on the left side. Psychiatric:        Attention and Perception: Attention normal.        Mood and Affect: Mood normal.        Speech: Speech normal.        Behavior: Behavior normal. Behavior is cooperative.        Thought Content: Thought content normal.    Results for orders placed or performed in visit on 06/22/22  Bayer DCA Hb A1c Waived  Result Value Ref Range   HB A1C (BAYER DCA - WAIVED) 6.3 (H) 4.8 - 5.6 %  Microalbumin, Urine Waived  Result Value Ref Range   Microalb, Ur Waived 30 (H) 0 - 19 mg/L   Creatinine, Urine Waived 100 10 - 300 mg/dL   Microalb/Creat Ratio <30 <30 mg/g  Comprehensive metabolic panel  Result Value Ref Range   Glucose 116 (H) 70 - 99 mg/dL   BUN 15 8 - 27 mg/dL   Creatinine, Ser 2.72 0.76 - 1.27 mg/dL   eGFR 87 >53 GU/YQI/3.47   BUN/Creatinine Ratio 16 10 - 24   Sodium 144 134 - 144 mmol/L   Potassium 4.1 3.5 - 5.2 mmol/L   Chloride 107 (H) 96 - 106 mmol/L   CO2 23 20 - 29 mmol/L   Calcium 8.9 8.6 - 10.2 mg/dL   Total Protein 6.6 6.0 - 8.5 g/dL   Albumin 4.0 3.8 - 4.8 g/dL   Globulin, Total 2.6 1.5 - 4.5 g/dL   Albumin/Globulin Ratio 1.5 1.2 - 2.2   Bilirubin Total 0.6 0.0 - 1.2 mg/dL   Alkaline Phosphatase 73 44 - 121 IU/L   AST 16 0 - 40 IU/L   ALT 12 0 - 44 IU/L  Lipid Panel w/o Chol/HDL Ratio  Result Value Ref Range   Cholesterol, Total 158 100 - 199 mg/dL   Triglycerides 49 0 - 149 mg/dL   HDL 65 >42 mg/dL   VLDL Cholesterol Cal 10 5 - 40  mg/dL   LDL Chol Calc (NIH) 83 0 - 99 mg/dL  PSA, total and free  Result Value Ref Range   Prostate Specific Ag, Serum 4.0 0.0 - 4.0 ng/mL   PSA, Free 0.43 N/A ng/mL   PSA, Free Pct 10.8 %      Assessment & Plan:   Problem List Items Addressed This Visit       Other   Arthralgia - Primary    Improved to areas of concern at this time.  Continue Tylenol as needed at home and return if any worsening.      Other Visit Diagnoses     Skin lesion       Referral to dermatology   Relevant Orders   Ambulatory referral to Dermatology   Need for influenza vaccination       Flu shot in office today.   Relevant Orders   Flu Vaccine Trivalent High Dose (Fluad) (Completed)        Follow up plan: No follow-ups on file.

## 2022-10-13 NOTE — Assessment & Plan Note (Signed)
Improved to areas of concern at this time.  Continue Tylenol as needed at home and return if any worsening.

## 2022-10-13 NOTE — Patient Instructions (Addendum)
Magnesium 400 MG Nature Made or Nature's Bounty  Hip Pain The hip is the joint between the upper legs and the lower pelvis. The bones, cartilage, tendons, and muscles of your hip joint support your body and allow you to move around. Hip pain can range from a minor ache to severe pain in one or both of your hips. The pain may be felt on the inside of the hip joint near the groin, or on the outside near the buttocks and upper thigh. You may also have swelling or stiffness in your hip area. Follow these instructions at home: Managing pain, stiffness, and swelling     If told, put ice on the painful area. Put ice in a plastic bag. Place a towel between your skin and the bag. Leave the ice on for 20 minutes, 2-3 times a day. If told, apply heat to the affected area as often as told by your health care provider. Use the heat source that your provider recommends, such as a moist heat pack or a heating pad. Place a towel between your skin and the heat source. Leave the heat on for 20-30 minutes. If your skin turns bright red, remove the ice or heat right away to prevent skin damage. The risk of damage is higher if you cannot feel pain, heat, or cold. Activity Do exercises as told by your provider. Avoid activities that cause pain. General instructions  Take over-the-counter and prescription medicines only as told by your provider. Keep a journal of your symptoms. Write down: How often you have hip pain. The location of your pain. What the pain feels like. What makes the pain worse. Sleep with a pillow between your legs on your most comfortable side. Keep all follow-up visits. Your provider will monitor your pain and activity. Contact a health care provider if: You cannot put weight on your leg. Your pain or swelling gets worse after a week. It gets harder to walk. You have a fever. Get help right away if: You fall. You have a sudden increase in pain and swelling in your hip. Your hip  is red or swollen or very tender to touch. This information is not intended to replace advice given to you by your health care provider. Make sure you discuss any questions you have with your health care provider. Document Revised: 10/04/2021 Document Reviewed: 10/04/2021 Elsevier Patient Education  2024 [employer].

## 2022-10-26 ENCOUNTER — Telehealth: Payer: Self-pay

## 2022-10-26 NOTE — Telephone Encounter (Signed)
Pt requesting call back to reschedule colonoscopy

## 2022-10-27 ENCOUNTER — Telehealth: Payer: Self-pay

## 2022-10-27 NOTE — Telephone Encounter (Signed)
Received message that patient would like to reschedule his 01/01/23 colonoscopy with Dr. Tobi Bastos.  I attempted to call back to reschedule but no voice mail.  I will try again next week to see if I can get in touch with him to reschedule.  Thanks,  [place], [place]

## 2022-10-30 ENCOUNTER — Telehealth: Payer: Self-pay

## 2022-10-30 MED ORDER — GOLYTELY 236 G PO SOLR: 4000.0000 mL | Freq: Every day | ORAL | 0 refills | Status: AC

## 2022-10-30 NOTE — Telephone Encounter (Signed)
Call returned to patient.  His colonoscopy has been rescheduled from 01/01/23 to 12/07/22 with Dr. Tobi Bastos.  Instructions updated.  Referral updated.  Thanks, [place], [place]

## 2022-11-03 ENCOUNTER — Other Ambulatory Visit: Payer: Self-pay | Admitting: Nurse Practitioner

## 2022-11-06 NOTE — Telephone Encounter (Signed)
Requested Prescriptions  Refused Prescriptions Disp Refills   metoprolol succinate (TOPROL-XL) 25 MG 24 hr tablet [Pharmacy Med Name: METOPROLOL SUCC ER 25 MG TAB] 90 tablet 0    Sig: TAKE ONE TABLET DAILY AS NEEDED.     Cardiovascular:  Beta Blockers Passed - 11/03/2022 12:15 PM      Passed - Last BP in normal range    BP Readings from Last 1 Encounters:  10/13/22 110/62         Passed - Last Heart Rate in normal range    Pulse Readings from Last 1 Encounters:  10/13/22 71         Passed - Valid encounter within last 6 months    Recent Outpatient Visits           3 weeks ago Pain in joint of right shoulder   [name] [name], [name], NP   4 months ago Angina pectoris [name], [name], NP   9 months ago Puncture wound   [name] Larae Grooms, NP   10 months ago Angina pectoris [facility])   [name], [name], NP   1 year ago Viral upper respiratory tract infection   [name] Larae Grooms, NP       Future Appointments             In 1 month [place], Dorie Rank, NP [facility]

## 2022-12-07 ENCOUNTER — Other Ambulatory Visit: Payer: Self-pay

## 2022-12-07 ENCOUNTER — Encounter
Admission: RE | Disposition: A | Discharge status: Home / Self Care | Payer: Self-pay | Source: Home / Self Care | Attending: Gastroenterology

## 2022-12-07 ENCOUNTER — Ambulatory Visit: Payer: Medicare Other | Admitting: Anesthesiology

## 2022-12-07 ENCOUNTER — Encounter: Payer: Self-pay | Admitting: Gastroenterology

## 2022-12-07 ENCOUNTER — Ambulatory Visit
Admission: RE | Admit: 2022-12-07 | Discharge: 2022-12-07 | Disposition: A | Discharge status: Home / Self Care | Payer: Medicare Other | Attending: Gastroenterology | Admitting: Gastroenterology

## 2022-12-07 DIAGNOSIS — Z1211 Encounter for screening for malignant neoplasm of colon: Secondary | ICD-10-CM | POA: Insufficient documentation

## 2022-12-07 DIAGNOSIS — I10 Essential (primary) hypertension: Secondary | ICD-10-CM | POA: Diagnosis not present

## 2022-12-07 DIAGNOSIS — K635 Polyp of colon: Secondary | ICD-10-CM | POA: Diagnosis not present

## 2022-12-07 DIAGNOSIS — K219 Gastro-esophageal reflux disease without esophagitis: Secondary | ICD-10-CM | POA: Insufficient documentation

## 2022-12-07 DIAGNOSIS — Z8249 Family history of ischemic heart disease and other diseases of the circulatory system: Secondary | ICD-10-CM | POA: Diagnosis not present

## 2022-12-07 DIAGNOSIS — I739 Peripheral vascular disease, unspecified: Secondary | ICD-10-CM | POA: Diagnosis not present

## 2022-12-07 DIAGNOSIS — K649 Unspecified hemorrhoids: Secondary | ICD-10-CM | POA: Diagnosis not present

## 2022-12-07 DIAGNOSIS — D122 Benign neoplasm of ascending colon: Secondary | ICD-10-CM | POA: Diagnosis not present

## 2022-12-07 DIAGNOSIS — Z87891 Personal history of nicotine dependence: Secondary | ICD-10-CM | POA: Insufficient documentation

## 2022-12-07 DIAGNOSIS — Z860100 Personal history of colon polyps, unspecified: Secondary | ICD-10-CM | POA: Diagnosis not present

## 2022-12-07 DIAGNOSIS — I219 Acute myocardial infarction, unspecified: Secondary | ICD-10-CM | POA: Diagnosis not present

## 2022-12-07 DIAGNOSIS — G4730 Sleep apnea, unspecified: Secondary | ICD-10-CM | POA: Diagnosis not present

## 2022-12-07 DIAGNOSIS — D120 Benign neoplasm of cecum: Secondary | ICD-10-CM | POA: Insufficient documentation

## 2022-12-07 DIAGNOSIS — I252 Old myocardial infarction: Secondary | ICD-10-CM | POA: Diagnosis not present

## 2022-12-07 HISTORY — PX: COLONOSCOPY WITH PROPOFOL: SHX5780

## 2022-12-07 HISTORY — DX: Acute myocardial infarction, unspecified: I21.9

## 2022-12-07 SURGERY — COLONOSCOPY WITH PROPOFOL
Anesthesia: General

## 2022-12-07 MED ORDER — SODIUM CHLORIDE 0.9 % IV SOLN: INTRAVENOUS | Status: DC

## 2022-12-07 MED ORDER — PROPOFOL 10 MG/ML IV BOLUS
INTRAVENOUS | Status: AC
Start: 2022-12-07 — End: ?
  Filled 2022-12-07: qty 40

## 2022-12-07 MED ORDER — PROPOFOL 10 MG/ML IV BOLUS
INTRAVENOUS | Status: DC | PRN
  Administered 2022-12-07: 50 mg via INTRAVENOUS

## 2022-12-07 MED ORDER — PROPOFOL 500 MG/50ML IV EMUL
INTRAVENOUS | Status: DC | PRN
  Administered 2022-12-07: 125 ug/kg/min via INTRAVENOUS

## 2022-12-07 MED ORDER — LIDOCAINE HCL (PF) 2 % IJ SOLN
INTRAMUSCULAR | Status: AC
  Filled 2022-12-07: qty 5

## 2022-12-07 MED ORDER — LIDOCAINE HCL (CARDIAC) PF 100 MG/5ML IV SOSY
PREFILLED_SYRINGE | INTRAVENOUS | Status: DC | PRN
  Administered 2022-12-07: 40 mg via INTRAVENOUS

## 2022-12-07 NOTE — Anesthesia Postprocedure Evaluation (Signed)
Anesthesia Post Note  Patient: [name]  Procedure(s) Performed: COLONOSCOPY WITH PROPOFOL  Patient location during evaluation: Endoscopy Anesthesia Type: General Level of consciousness: awake and alert Pain management: pain level controlled Vital Signs Assessment: post-procedure vital signs reviewed and stable Respiratory status: spontaneous breathing, nonlabored ventilation, respiratory function stable and patient connected to nasal cannula oxygen Cardiovascular status: blood pressure returned to baseline and stable Postop Assessment: no apparent nausea or vomiting Anesthetic complications: no   No notable events documented.   Last Vitals:  Vitals:   12/07/22 1023 12/07/22 1033  BP: 95/76 (!) 150/82  Pulse: 68 60  Resp: (!) 9 12  Temp: (!) 36.2 C (!) 36.2 C  SpO2: 99% 100%    Last Pain:  Vitals:   12/07/22 1033  TempSrc: Temporal  PainSc: 0-No pain                 Cleda Mccreedy Vivian Okelley

## 2022-12-07 NOTE — Plan of Care (Signed)
[facility] Tonsillectomy/Adenoidectomy, Postoperative PEDS care plan entered in error.

## 2022-12-07 NOTE — Op Note (Signed)
[facility] Gastroenterology Patient Name: Derek Wright Procedure Date: 12/07/2022 9:41 AM MRN: [id number] Account #: [id number] Date of Birth: [birth year] Admit Type: Outpatient Age: 77 Room: [facility] ENDO ROOM 1 Gender: Male Note Status: Finalized Instrument Name: Prentice Docker [id number] Procedure:             Colonoscopy Indications:           Screening for colorectal malignant neoplasm Providers:             Wyline Mood MD, MD Referring MD:          Dorie Rank. Harvest Dark (Referring MD) Medicines:             Monitored Anesthesia Care Complications:         No immediate complications. Procedure:             Pre-Anesthesia Assessment:                        - Prior to the procedure, a History and Physical was                         performed, and patient medications, allergies and                         sensitivities were reviewed. The patient's tolerance                         of previous anesthesia was reviewed.                        - The risks and benefits of the procedure and the                         sedation options and risks were discussed with the                         patient. All questions were answered and informed                         consent was obtained.                        - ASA Grade Assessment: II - A patient with mild                         systemic disease.                        After obtaining informed consent, the colonoscope was                         passed under direct vision. Throughout the procedure,                         the patient's blood pressure, pulse, and oxygen                         saturations were monitored continuously. The                         Colonoscope was introduced  through the anus and                         advanced to the the cecum, identified by the                         appendiceal orifice. The colonoscopy was performed                         with ease. The patient tolerated the procedure well.                          The quality of the bowel preparation was adequate. The                         ileocecal valve, appendiceal orifice, and rectum were                         photographed. Findings:      The perianal and digital rectal examinations were normal.      A 3 mm polyp was found in the cecum. The polyp was sessile. The polyp       was removed with a cold biopsy forceps. Resection and retrieval were       complete.      Two sessile polyps were found in the ascending colon. The polyps were 4       to 5 mm in size. These polyps were removed with a cold snare. Resection       was complete, but the polyp tissue was not retrieved.      The exam was otherwise without abnormality on direct and retroflexion       views. Impression:            - One 3 mm polyp in the cecum, removed with a cold                         biopsy forceps. Resected and retrieved.                        - Two 4 to 5 mm polyps in the ascending colon, removed                         with a cold snare. Complete resection. Polyp tissue                         not retrieved.                        - The examination was otherwise normal on direct and                         retroflexion views. Recommendation:        - Discharge patient to home (with escort).                        - Resume previous diet.                        - Continue present medications.                        -  Await pathology results.                        - Repeat colonoscopy is not recommended due to current                         age (75 years or older) for surveillance. Procedure Code(s):     --- Professional ---                        [phone], Colonoscopy, flexible; with removal of                         tumor(s), polyp(s), or other lesion(s) by snare                         technique                        [id number], 59, Colonoscopy, flexible; with biopsy, single                         or multiple Diagnosis Code(s):     --- Professional  ---                        Z12.11, Encounter for screening for malignant neoplasm                         of colon                        D12.0, Benign neoplasm of cecum                        D12.2, Benign neoplasm of ascending colon CPT copyright 2022 American Medical Association. All rights reserved. The codes documented in this report are preliminary and upon coder review may  be revised to meet current compliance requirements. Wyline Mood, MD Wyline Mood MD, MD 12/07/2022 10:15:11 AM This report has been signed electronically. Number of Addenda: 0 Note Initiated On: 12/07/2022 9:41 AM Scope Withdrawal Time: 0 hours 10 minutes 32 seconds  Total Procedure Duration: 0 hours 16 minutes 21 seconds  Estimated Blood Loss:  Estimated blood loss: none.      [facility]

## 2022-12-07 NOTE — Anesthesia Preprocedure Evaluation (Signed)
Anesthesia Evaluation  Patient identified by MRN, date of birth, ID band Patient awake    Reviewed: Allergy & Precautions, NPO status , Patient's Chart, lab work & pertinent test results  History of Anesthesia Complications Negative for: history of anesthetic complications  Airway Mallampati: III  TM Distance: >3 FB Neck ROM: full    Dental  (+) Upper Dentures, Lower Dentures   Pulmonary neg shortness of breath, sleep apnea , former smoker   Pulmonary exam normal        Cardiovascular hypertension, (-) angina + Past MI and + Peripheral Vascular Disease  Normal cardiovascular exam     Neuro/Psych negative neurological ROS  negative psych ROS   GI/Hepatic Neg liver ROS,GERD  ,,  Endo/Other  negative endocrine ROS    Renal/GU negative Renal ROS  negative genitourinary   Musculoskeletal   Abdominal   Peds  Hematology negative hematology ROS (+)   Anesthesia Other Findings Past Medical History: No date: Arthritis No date: GERD (gastroesophageal reflux disease)     Comment:  RARE-NO MEDS No date: Hyperlipidemia No date: Hypertension No date: Myocardial infarction (HCC) No date: Pre-diabetes No date: Sleep apnea     Comment:  MILD-NO CPAP-COULD NOT TOELERATE No date: Status post surgical removal of nail matrix of toe of left  foot No date: Status post surgical removal of nail matrix of toe of right  foot  Past Surgical History: 2012: COLONOSCOPY 07/20/2020: COLONOSCOPY WITH PROPOFOL; N/A     Comment:  Procedure: COLONOSCOPY WITH PROPOFOL;  Surgeon:               Pasty Spillers, MD;  Location: [facility] ENDOSCOPY;                Service: Endoscopy;  Laterality: N/A; 12/30/2021: COLONOSCOPY WITH PROPOFOL; N/A     Comment:  Procedure: COLONOSCOPY WITH PROPOFOL;  Surgeon: Wyline Mood, MD;  Location: [facility] ENDOSCOPY;  Service:               Gastroenterology;  Laterality: N/A; No date: hair folicle  removed No date: HERNIA REPAIR 09/29/2015: INGUINAL HERNIA REPAIR; Bilateral     Comment:  Procedure: LAPAROSCOPIC BILATERAL INGUINAL HERNIA               REPAIR;  Surgeon: Earline Mayotte, MD;  Location: [facility]               ORS;  Service: General;  Laterality: Bilateral; 07/18/2021: LEFT HEART CATH AND CORONARY ANGIOGRAPHY; Left     Comment:  Procedure: LEFT HEART CATH AND CORONARY ANGIOGRAPHY;                Surgeon: Alwyn Pea, MD;  Location: [facility] INVASIVE              CV LAB;  Service: Cardiovascular;  Laterality: Left; 1966: SKIN GRAFT; Right     Comment:  hand     Reproductive/Obstetrics negative OB ROS                             Anesthesia Physical Anesthesia Plan  ASA: 3  Anesthesia Plan: General   Post-op Pain Management:    Induction: Intravenous  PONV Risk Score and Plan: Propofol infusion and TIVA  Airway Management Planned: Natural Airway and Nasal Cannula  Additional Equipment:   Intra-op Plan:   Post-operative Plan:   Informed  Consent: I have reviewed the patients History and Physical, chart, labs and discussed the procedure including the risks, benefits and alternatives for the proposed anesthesia with the patient or authorized representative who has indicated his/her understanding and acceptance.     Dental Advisory Given  Plan Discussed with: Anesthesiologist, CRNA and Surgeon  Anesthesia Plan Comments: (Patient consented for risks of anesthesia including but not limited to:  - adverse reactions to medications - risk of airway placement if required - damage to eyes, teeth, lips or other oral mucosa - nerve damage due to positioning  - sore throat or hoarseness - Damage to heart, brain, nerves, lungs, other parts of body or loss of life  Patient voiced understanding and assent.)       Anesthesia Quick Evaluation

## 2022-12-07 NOTE — Transfer of Care (Signed)
Immediate Anesthesia Transfer of Care Note  Patient: Derek Wright  Procedure(s) Performed: COLONOSCOPY WITH PROPOFOL  Patient Location: PACU  Anesthesia Type:General  Level of Consciousness: drowsy  Airway & Oxygen Therapy: Patient Spontanous Breathing  Post-op Assessment: Report given to RN and Post -op Vital signs reviewed and stable  Post vital signs: stable  Last Vitals:  Vitals Value Taken Time  BP 83/52 12/07/22 1014  Temp 36.2 C 12/07/22 1013  Pulse 77 12/07/22 1015  Resp 11 12/07/22 1015  SpO2 100 % 12/07/22 1015  Vitals shown include unfiled device data.  Last Pain:  Vitals:   12/07/22 1013  TempSrc: Temporal  PainSc: Asleep         Complications: No notable events documented.

## 2022-12-08 LAB — SURGICAL PATHOLOGY

## 2022-12-12 NOTE — H&P (Signed)
Wyline Mood, MD [address], [address], [place], [place], [address] [address], [place], [place], [address] Phone: [phone]  Fax: [phone]  Primary Care Physician:  Marjie Skiff, NP   Pre-Procedure History & Physical: HPI:  Derek Wright is a 77 y.o. male is here for an colonoscopy.   Past Medical History:  Diagnosis Date   Arthritis    GERD (gastroesophageal reflux disease)    RARE-NO MEDS   Hyperlipidemia    Hypertension    Myocardial infarction (HCC)    Pre-diabetes    Sleep apnea    MILD-NO CPAP-COULD NOT TOELERATE   Status post surgical removal of nail matrix of toe of left foot    Status post surgical removal of nail matrix of toe of right foot     Past Surgical History:  Procedure Laterality Date   COLONOSCOPY  2012   COLONOSCOPY WITH PROPOFOL N/A 07/20/2020   Procedure: COLONOSCOPY WITH PROPOFOL;  Surgeon: Pasty Spillers, MD;  Location: [facility] ENDOSCOPY;  Service: Endoscopy;  Laterality: N/A;   COLONOSCOPY WITH PROPOFOL N/A 12/30/2021   Procedure: COLONOSCOPY WITH PROPOFOL;  Surgeon: Wyline Mood, MD;  Location: [facility] ENDOSCOPY;  Service: Gastroenterology;  Laterality: N/A;   COLONOSCOPY WITH PROPOFOL N/A 12/07/2022   Procedure: COLONOSCOPY WITH PROPOFOL;  Surgeon: Wyline Mood, MD;  Location: [facility] ENDOSCOPY;  Service: Gastroenterology;  Laterality: N/A;   hair folicle removed     HERNIA REPAIR     INGUINAL HERNIA REPAIR Bilateral 09/29/2015   Procedure: LAPAROSCOPIC BILATERAL INGUINAL HERNIA REPAIR;  Surgeon: Earline Mayotte, MD;  Location: [facility] ORS;  Service: General;  Laterality: Bilateral;   LEFT HEART CATH AND CORONARY ANGIOGRAPHY Left 07/18/2021   Procedure: LEFT HEART CATH AND CORONARY ANGIOGRAPHY;  Surgeon: Alwyn Pea, MD;  Location: [facility] INVASIVE CV LAB;  Service: Cardiovascular;  Laterality: Left;   SKIN GRAFT Right 1966   hand    Prior to Admission medications   Medication Sig Start Date End Date Taking?  Authorizing Provider  atorvastatin (LIPITOR) 40 MG tablet Take 1 tablet (40 mg total) by mouth daily. 12/22/21  Yes Cannady, Jolene T, NP  isosorbide mononitrate (IMDUR) 30 MG 24 hr tablet Take 30 mg by mouth daily. 09/09/21  Yes [provider]  metoprolol succinate (TOPROL-XL) 25 MG 24 hr tablet TAKE ONE TABLET DAILY BY MOUTH. 12/22/21  Yes Cannady, Jolene T, NP  Multiple Vitamin (MULTIVITAMIN WITH MINERALS) TABS tablet Take 1 tablet by mouth 2 (two) times a week. One-A-Day   Yes [provider]  omeprazole (PRILOSEC) 40 MG capsule Take 1 capsule (40 mg total) by mouth in the morning. 12/22/21  Yes Cannady, Jolene T, NP  QC LO-DOSE ASPIRIN 81 MG tablet Take 81 mg by mouth daily. 12/15/21  Yes [provider]  Aspirin-Acetaminophen-Caffeine (BC FAST PAIN RELIEF MAX STR) 500-500-65 MG PACK Take 1 packet by mouth daily as needed (pain.).    [provider]  Blood Glucose Monitoring Suppl (ACCU-CHEK AVIVA PLUS) w/Device KIT 1 each by Does not apply route 2 (two) times daily. 01/19/21   Cannady, [name], NP  clobetasol cream (TEMOVATE) 0.05 % Apply 1 application. topically 2 (two) times daily. 06/20/21   Cannady, Jolene T, NP  glucose blood (ACCU-CHEK AVIVA PLUS) test strip 1 each by Other route 2 (two) times daily. 01/19/21   Cannady, [name], NP  polyethylene glycol (GOLYTELY) 236 g solution Take 4,000 mLs by mouth 2 (two) times daily. 03/20/22  Wyline Mood, MD    Allergies as of 03/20/2022   (No Known Allergies)    Family History  Problem Relation Age of Onset   Ovarian cancer Mother    Heart disease Father    Lymphoma Maternal Uncle     Social History   Socioeconomic History   Marital status: Married    Spouse name: Not on file   Number of children: Not on file   Years of education: 12   Highest education level: High school graduate  Occupational History   Not on file  Tobacco Use   Smoking status: Former    Current packs/day: 0.00    Average  packs/day: 0.3 packs/day for 15.0 years (3.8 ttl pk-yrs)    Types: Cigarettes    Start date: 09/13/1965    Quit date: 09/13/1980    Years since quitting: 42.2   Smokeless tobacco: Never  Vaping Use   Vaping status: Never Used  Substance and Sexual Activity   Alcohol use: Not Currently    Comment: on occasion   Drug use: No   Sexual activity: Not on file  Other Topics Concern   Not on file  Social History Narrative   Not on file   Social Determinants of Health   Financial Resource Strain: Low Risk  (03/20/2022)   Overall Financial Resource Strain (CARDIA)    Difficulty of Paying Living Expenses: Not hard at all  Food Insecurity: No Food Insecurity (03/20/2022)   Hunger Vital Sign    Worried About Running Out of Food in the Last Year: Never true    Ran Out of Food in the Last Year: Never true  Transportation Needs: No Transportation Needs (03/20/2022)   PRAPARE - Administrator, Civil Service (Medical): No    Lack of Transportation (Non-Medical): No  Physical Activity: Insufficiently Active (03/20/2022)   Exercise Vital Sign    Days of Exercise per Week: 2 days    Minutes of Exercise per Session: 20 min  Stress: No Stress Concern Present (03/20/2022)   [employer] of Occupational Health - Occupational Stress Questionnaire    Feeling of Stress : Not at all  Social Connections: Moderately Integrated (03/20/2022)   Social Connection and Isolation Panel [NHANES]    Frequency of Communication with Friends and Family: Once a week    Frequency of Social Gatherings with Friends and Family: Twice a week    Attends Religious Services: More than 4 times per year    Active Member of [employer] or Organizations: No    Attends Banker Meetings: Never    Marital Status: Married  Catering manager Violence: Not At Risk (03/20/2022)   Humiliation, Afraid, Rape, and Kick questionnaire    Fear of Current or Ex-Partner: No    Emotionally Abused: No    Physically Abused: No     Sexually Abused: No    Review of Systems: See HPI, otherwise negative ROS  Physical Exam: BP (!) 150/82   Pulse 60   Temp (!) 97.2 F (36.2 C) (Temporal)   Resp 12   Ht 6\' 3"  (1.905 m)   Wt 117.8 kg   SpO2 100%   BMI 32.45 kg/m  General:   Alert,  pleasant and cooperative in NAD Head:  Normocephalic and atraumatic. Neck:  Supple; no masses or thyromegaly. Lungs:  Clear throughout to auscultation, normal respiratory effort.    Heart:  +S1, +S2, Regular rate and rhythm, No edema. Abdomen:  Soft, nontender and nondistended. Normal bowel  sounds, without guarding, and without rebound.   Neurologic:  Alert and  oriented x4;  grossly normal neurologically.  Impression/Plan: [name] is here for an colonoscopy to be performed for Screening colonoscopy average risk   Risks, benefits, limitations, and alternatives regarding  colonoscopy have been reviewed with the patient.  Questions have been answered.  All parties agreeable.   Wyline Mood, MD  12/12/2022, 8:29 AM

## 2022-12-20 ENCOUNTER — Encounter: Payer: Self-pay | Admitting: Gastroenterology

## 2022-12-22 DIAGNOSIS — H2513 Age-related nuclear cataract, bilateral: Secondary | ICD-10-CM | POA: Diagnosis not present

## 2022-12-22 DIAGNOSIS — H40003 Preglaucoma, unspecified, bilateral: Secondary | ICD-10-CM | POA: Diagnosis not present

## 2022-12-22 DIAGNOSIS — E113293 Type 2 diabetes mellitus with mild nonproliferative diabetic retinopathy without macular edema, bilateral: Secondary | ICD-10-CM | POA: Diagnosis not present

## 2022-12-22 LAB — HM DIABETES EYE EXAM

## 2022-12-25 ENCOUNTER — Encounter: Payer: Medicare Other | Admitting: Nurse Practitioner

## 2022-12-27 ENCOUNTER — Encounter: Payer: Medicare Other | Admitting: Nurse Practitioner

## 2022-12-27 DIAGNOSIS — N400 Enlarged prostate without lower urinary tract symptoms: Secondary | ICD-10-CM

## 2022-12-27 DIAGNOSIS — E66811 Obesity, class 1: Secondary | ICD-10-CM

## 2022-12-27 DIAGNOSIS — I739 Peripheral vascular disease, unspecified: Secondary | ICD-10-CM

## 2022-12-27 DIAGNOSIS — Z0000 Encounter for general adult medical examination without abnormal findings: Secondary | ICD-10-CM

## 2022-12-27 DIAGNOSIS — R12 Heartburn: Secondary | ICD-10-CM

## 2022-12-27 DIAGNOSIS — I209 Angina pectoris, unspecified: Secondary | ICD-10-CM

## 2022-12-27 DIAGNOSIS — I10 Essential (primary) hypertension: Secondary | ICD-10-CM

## 2022-12-27 DIAGNOSIS — E782 Mixed hyperlipidemia: Secondary | ICD-10-CM

## 2022-12-27 DIAGNOSIS — R7309 Other abnormal glucose: Secondary | ICD-10-CM

## 2022-12-27 DIAGNOSIS — I700 Atherosclerosis of aorta: Secondary | ICD-10-CM

## 2023-01-01 ENCOUNTER — Telehealth: Payer: Self-pay | Admitting: Gastroenterology

## 2023-01-01 NOTE — Telephone Encounter (Signed)
Called patient to let him know that his pathology report stated that his polyps were benign and because if his age, there was no need to have another colonoscopy unless he was having any other issues and then he would have to be evaluated by Dr. Tobi Bastos first to make sure if he would need a procedure or not. Patient agreed and had no further questions.

## 2023-01-01 NOTE — Telephone Encounter (Signed)
Patient has some concerns and questions about the letter that was sent out. From Dr. Tobi Bastos stating he remove some polyps.

## 2023-01-07 NOTE — Patient Instructions (Signed)
Be Involved in Caring For Your Health:  Taking Medications When medications are taken as directed, they can greatly improve your health. But if they are not taken as prescribed, they may not work. In some cases, not taking them correctly can be harmful. To help ensure your treatment remains effective and safe, understand your medications and how to take them. Bring your medications to each visit for review by your provider.  Your lab results, notes, and after visit summary will be available on My Chart. We strongly encourage you to use this feature. If lab results are abnormal the clinic will contact you with the appropriate steps. If the clinic does not contact you assume the results are satisfactory. You can always view your results on My Chart. If you have questions regarding your health or results, please contact the clinic during office hours. You can also ask questions on My Chart.  We at [facility] are grateful that you chose [place] to provide your care. We strive to provide evidence-based and compassionate care and are always looking for feedback. If you get a survey from the clinic please complete this so we can hear your opinions.  Heart-Healthy Eating Plan Many factors influence your heart health, including eating and exercise habits. Heart health is also called coronary health. Coronary risk increases with abnormal blood fat (lipid) levels. A heart-healthy eating plan includes limiting unhealthy fats, increasing healthy fats, limiting salt (sodium) intake, and making other diet and lifestyle changes. What is my plan? Your health care provider may recommend that: You limit your fat intake to _________% or less of your total calories each day. You limit your saturated fat intake to _________% or less of your total calories each day. You limit the amount of cholesterol in your diet to less than _________ mg per day. You limit the amount of sodium in your diet to less than _________  mg per day. What are tips for following this plan? Cooking Cook foods using methods other than frying. Baking, boiling, grilling, and broiling are all good options. Other ways to reduce fat include: Removing the skin from poultry. Removing all visible fats from meats. Steaming vegetables in water or broth. Meal planning  At meals, imagine dividing your plate into fourths: Fill one-half of your plate with vegetables and green salads. Fill one-fourth of your plate with whole grains. Fill one-fourth of your plate with lean protein foods. Eat 2-4 cups of vegetables per day. One cup of vegetables equals 1 cup (91 g) broccoli or cauliflower florets, 2 medium carrots, 1 large bell pepper, 1 large sweet potato, 1 large tomato, 1 medium white potato, 2 cups (150 g) raw leafy greens. Eat 1-2 cups of fruit per day. One cup of fruit equals 1 small apple, 1 large banana, 1 cup (237 g) mixed fruit, 1 large orange,  cup (82 g) dried fruit, 1 cup (240 mL) 100% fruit juice. Eat more foods that contain soluble fiber. Examples include apples, broccoli, carrots, beans, peas, and barley. Aim to get 25-30 g of fiber per day. Increase your consumption of legumes, nuts, and seeds to 4-5 servings per week. One serving of dried beans or legumes equals  cup (90 g) cooked, 1 serving of nuts is  oz (12 almonds, 24 pistachios, or 7 walnut halves), and 1 serving of seeds equals  oz (8 g). Fats Choose healthy fats more often. Choose monounsaturated and polyunsaturated fats, such as olive and canola oils, avocado oil, flaxseeds, walnuts, almonds, and seeds. Eat  more omega-3 fats. Choose salmon, mackerel, sardines, tuna, flaxseed oil, and ground flaxseeds. Aim to eat fish at least 2 times each week. Check food labels carefully to identify foods with trans fats or high amounts of saturated fat. Limit saturated fats. These are found in animal products, such as meats, butter, and cream. Plant sources of saturated fats  include palm oil, palm kernel oil, and coconut oil. Avoid foods with partially hydrogenated oils in them. These contain trans fats. Examples are stick margarine, some tub margarines, cookies, crackers, and other baked goods. Avoid fried foods. General information Eat more home-cooked food and less restaurant, buffet, and fast food. Limit or avoid alcohol. Limit foods that are high in added sugar and simple starches such as foods made using white refined flour (white breads, pastries, sweets). Lose weight if you are overweight. Losing just 5-10% of your body weight can help your overall health and prevent diseases such as diabetes and heart disease. Monitor your sodium intake, especially if you have high blood pressure. Talk with your health care provider about your sodium intake. Try to incorporate more vegetarian meals weekly. What foods should I eat? Fruits All fresh, canned (in natural juice), or frozen fruits. Vegetables Fresh or frozen vegetables (raw, steamed, roasted, or grilled). Green salads. Grains Most grains. Choose whole wheat and whole grains most of the time. Rice and pasta, including brown rice and pastas made with whole wheat. Meats and other proteins Lean, well-trimmed beef, veal, pork, and lamb. Chicken and [place] without skin. All fish and shellfish. Wild duck, rabbit, pheasant, and venison. Egg whites or low-cholesterol egg substitutes. Dried beans, peas, lentils, and tofu. Seeds and most nuts. Dairy Low-fat or nonfat cheeses, including ricotta and mozzarella. Skim or 1% milk (liquid, powdered, or evaporated). Buttermilk made with low-fat milk. Nonfat or low-fat yogurt. Fats and oils Non-hydrogenated (trans-free) margarines. Vegetable oils, including soybean, sesame, sunflower, olive, avocado, peanut, safflower, corn, canola, and cottonseed. Salad dressings or mayonnaise made with a vegetable oil. Beverages Water (mineral or sparkling). Coffee and tea. Unsweetened ice  tea. Diet beverages. Sweets and desserts Sherbet, gelatin, and fruit ice. Small amounts of dark chocolate. Limit all sweets and desserts. Seasonings and condiments All seasonings and condiments. The items listed above may not be a complete list of foods and beverages you can eat. Contact a dietitian for more options. What foods should I avoid? Fruits Canned fruit in heavy syrup. Fruit in cream or butter sauce. Fried fruit. Limit coconut. Vegetables Vegetables cooked in cheese, cream, or butter sauce. Fried vegetables. Grains Breads made with saturated or trans fats, oils, or whole milk. Croissants. Sweet rolls. Donuts. High-fat crackers, such as cheese crackers and chips. Meats and other proteins Fatty meats, such as hot dogs, ribs, sausage, bacon, rib-eye roast or steak. High-fat deli meats, such as salami and bologna. Caviar. Domestic duck and goose. Organ meats, such as liver. Dairy Cream, sour cream, cream cheese, and creamed cottage cheese. Whole-milk cheeses. Whole or 2% milk (liquid, evaporated, or condensed). Whole buttermilk. Cream sauce or high-fat cheese sauce. Whole-milk yogurt. Fats and oils Meat fat, or shortening. Cocoa butter, hydrogenated oils, palm oil, coconut oil, palm kernel oil. Solid fats and shortenings, including bacon fat, salt pork, lard, and butter. Nondairy cream substitutes. Salad dressings with cheese or sour cream. Beverages Regular sodas and any drinks with added sugar. Sweets and desserts Frosting. Pudding. Cookies. Cakes. Pies. Milk chocolate or white chocolate. Buttered syrups. Full-fat ice cream or ice cream drinks. The items listed above may  not be a complete list of foods and beverages to avoid. Contact a dietitian for more information. Summary Heart-healthy meal planning includes limiting unhealthy fats, increasing healthy fats, limiting salt (sodium) intake and making other diet and lifestyle changes. Lose weight if you are overweight. Losing just  5-10% of your body weight can help your overall health and prevent diseases such as diabetes and heart disease. Focus on eating a balance of foods, including fruits and vegetables, low-fat or nonfat dairy, lean protein, nuts and legumes, whole grains, and heart-healthy oils and fats. This information is not intended to replace advice given to you by your health care provider. Make sure you discuss any questions you have with your health care provider. Document Revised: 03/07/2021 Document Reviewed: 03/07/2021 [employer] Patient Education  2024 [employer].

## 2023-01-08 ENCOUNTER — Encounter: Payer: Self-pay | Admitting: Nurse Practitioner

## 2023-01-08 ENCOUNTER — Ambulatory Visit: Payer: Medicare Other | Admitting: Nurse Practitioner

## 2023-01-08 VITALS — BP 119/73 | HR 65 | Temp 97.8°F | Ht 75.3 in | Wt 268.8 lb

## 2023-01-08 DIAGNOSIS — Z0000 Encounter for general adult medical examination without abnormal findings: Secondary | ICD-10-CM

## 2023-01-08 DIAGNOSIS — I209 Angina pectoris, unspecified: Secondary | ICD-10-CM | POA: Diagnosis not present

## 2023-01-08 DIAGNOSIS — I739 Peripheral vascular disease, unspecified: Secondary | ICD-10-CM

## 2023-01-08 DIAGNOSIS — M25551 Pain in right hip: Secondary | ICD-10-CM

## 2023-01-08 DIAGNOSIS — R12 Heartburn: Secondary | ICD-10-CM

## 2023-01-08 DIAGNOSIS — I10 Essential (primary) hypertension: Secondary | ICD-10-CM

## 2023-01-08 DIAGNOSIS — N400 Enlarged prostate without lower urinary tract symptoms: Secondary | ICD-10-CM

## 2023-01-08 DIAGNOSIS — E782 Mixed hyperlipidemia: Secondary | ICD-10-CM

## 2023-01-08 DIAGNOSIS — R7309 Other abnormal glucose: Secondary | ICD-10-CM

## 2023-01-08 DIAGNOSIS — I700 Atherosclerosis of aorta: Secondary | ICD-10-CM | POA: Diagnosis not present

## 2023-01-08 DIAGNOSIS — E6609 Other obesity due to excess calories: Secondary | ICD-10-CM

## 2023-01-08 DIAGNOSIS — E66811 Obesity, class 1: Secondary | ICD-10-CM

## 2023-01-08 LAB — BAYER DCA HB A1C WAIVED: HB A1C (BAYER DCA - WAIVED): 6.1 % — ABNORMAL HIGH (ref 4.8–5.6)

## 2023-01-08 MED ORDER — OMEPRAZOLE 40 MG PO CPDR: 40.0000 mg | DELAYED_RELEASE_CAPSULE | Freq: Every morning | ORAL | 4 refills | Status: DC

## 2023-01-08 MED ORDER — LIDOCAINE 5 % EX PTCH: 1.0000 | MEDICATED_PATCH | CUTANEOUS | 0 refills | Status: AC

## 2023-01-08 MED ORDER — ATORVASTATIN CALCIUM 40 MG PO TABS: 40.0000 mg | ORAL_TABLET | Freq: Every day | ORAL | 4 refills | Status: DC

## 2023-01-08 MED ORDER — METOPROLOL SUCCINATE ER 25 MG PO TB24: ORAL_TABLET | ORAL | 4 refills | Status: DC

## 2023-01-08 NOTE — Assessment & Plan Note (Signed)
Chronic, ongoing.  Continue daily statin and adjust dose as needed. Continue daily ASA 81 MG for prevention.  Return in 6 months.  Lipid panel today.

## 2023-01-08 NOTE — Assessment & Plan Note (Signed)
Chronic, stable without medication at this time.  No further PSA due to age >28.  Discussed with patient.

## 2023-01-08 NOTE — Progress Notes (Signed)
BP 119/73   Pulse 65   Temp 97.8 F (36.6 C) (Oral)   Ht 6' 3.3" (1.913 m)   Wt 268 lb 12.8 oz (121.9 kg)   SpO2 98%   BMI 33.33 kg/m    Subjective:    [name], male    DOB: [birth year], 77 y.o.   MRN: [id number]  HPI: Derek Wright is a 77 y.o. male presenting on 01/08/2023 for comprehensive medical examination. Current medical complaints include:none  He currently lives with: self Interim Problems from his last visit: no   HYPERTENSION/HLD Follows with Dr. Juliann Pares, last seen 05/17/22.   Had left heart catheterization on 07/18/21 due to angina pain -- taking ASA and Imdur. No more chest pain since that time.  Continues on Metoprolol XL and Atorvastatin for HLD.    Aortic atherosclerosis noted on CT imaging 08/17/17. Hypertension status: stable  Satisfied with current treatment? yes Duration of hypertension: chronic BP monitoring frequency:  not checking BP range:  BP medication side effects:  no Medication compliance: good compliance Aspirin: no Recurrent headaches: no Visual changes: no Palpitations: no Dyspnea: no Chest pain: no Lower extremity edema: yes, at baseline, no worse Dizzy/lightheaded: no  The 10-year ASCVD risk score (Arnett DK, et al., 2019) is: 29.9%   Values used to calculate the score:     Age: 36 years     Sex: Male     Is Non-Hispanic African American: Yes     Diabetic: Yes     Tobacco smoker: No     Systolic Blood Pressure: 119 mmHg     Is BP treated: Yes     HDL Cholesterol: 65 mg/dL     Total Cholesterol: 158 mg/dL  Impaired Fasting Glucose & GERD No current medications.  Continues on Omeprazole at home for GERD.   HbA1C:  Lab Results  Component Value Date   HGBA1C 6.3 (H) 06/22/2022  Duration of elevated blood sugar: years Polydipsia: no Polyuria: no Weight change: no Visual disturbance: no Glucose Monitoring: no    Accucheck frequency: Not Checking    Fasting glucose:     Post prandial:  Diabetic Education: Not  Completed Family history of diabetes: yes   HIP PAIN (RIGHT) Present since summer, had been walking around and turned, then hip popped. Since this time he has had pain.  Can not stand on it for too long. Duration: months Involved hip: right  Mechanism of injury: unknown Location: lateral Onset: gradual  Severity: 6/10  Quality: dull, aching, and throbbing -- occasional sharp achy Frequency: intermittent Radiation: no Aggravating factors: weight bearing, walking, stairs, bending, and movement   Alleviating factors: Lidocaine patches and Voltaren gel, Tylenol, heat Status: fluctuating Treatments attempted: as above Relief with NSAIDs?: No NSAIDs Taken Weakness with weight bearing: no Weakness with walking: no Paresthesias / decreased sensation: no Swelling: no Redness:no Fevers: no   Functional Status Survey: Is the patient deaf or have difficulty hearing?: No Does the patient have difficulty seeing, even when wearing glasses/contacts?: No Does the patient have difficulty concentrating, remembering, or making decisions?: No Does the patient have difficulty walking or climbing stairs?: No Does the patient have difficulty dressing or bathing?: No Does the patient have difficulty doing errands alone such as visiting a doctor's office or shopping?: No  FALL RISK:    01/08/2023    9:42 AM 10/13/2022    3:10 PM 06/22/2022    8:39 AM 03/20/2022    8:24 AM 12/22/2021  9:02 AM  Fall Risk   Falls in the past year? 0 0 0 1 0  Number falls in past yr: 0 0 0 1 0  Injury with Fall? 0 0 0 0 0  Risk for fall due to : No Fall Risks No Fall Risks No Fall Risks History of fall(s) No Fall Risks  Follow up Falls evaluation completed Falls evaluation completed Falls evaluation completed Falls evaluation completed;Falls prevention discussed Falls prevention discussed   Depression Screen    01/08/2023    9:42 AM 10/13/2022    3:11 PM 06/22/2022    8:39 AM 03/20/2022    8:22 AM 12/22/2021    8:58  AM  Depression screen PHQ 2/9  Decreased Interest 0 0 0 0 0  Down, Depressed, Hopeless 0 0 0 0 0  PHQ - 2 Score 0 0 0 0 0  Altered sleeping 0 0 0 0 0  Tired, decreased energy 0 0 0 0 0  Change in appetite 0 0 0 0 0  Feeling bad or failure about yourself  0 0 0 0 0  Trouble concentrating 0 0 0 0 0  Moving slowly or fidgety/restless 0 0 0 0 0  Suicidal thoughts 0 0 0 0 0  PHQ-9 Score 0 0 0 0 0  Difficult doing work/chores Not difficult at all Not difficult at all Not difficult at all Not difficult at all Not difficult at all      01/08/2023    9:42 AM 10/13/2022    3:11 PM 06/22/2022    8:39 AM 12/22/2021    8:59 AM  GAD 7 : Generalized Anxiety Score  Nervous, Anxious, on Edge 0 0 0 0  Control/stop worrying 0 0 0 0  Worry too much - different things 0 0 0 0  Trouble relaxing 0 0 0 0  Restless 0 0 0 0  Easily annoyed or irritable 0 0 0 0  Afraid - awful might happen 0 0 0 0  Total GAD 7 Score 0 0 0 0  Anxiety Difficulty Not difficult at all Not difficult at all Not difficult at all Not difficult at all   Past Medical History:  Past Medical History:  Diagnosis Date   Arthritis    GERD (gastroesophageal reflux disease)    RARE-NO MEDS   Hyperlipidemia    Hypertension    Myocardial infarction (HCC)    Pre-diabetes    Sleep apnea    MILD-NO CPAP-COULD NOT TOELERATE   Status post surgical removal of nail matrix of toe of left foot    Status post surgical removal of nail matrix of toe of right foot     Surgical History:  Past Surgical History:  Procedure Laterality Date   COLONOSCOPY  2012   COLONOSCOPY WITH PROPOFOL N/A 07/20/2020   Procedure: COLONOSCOPY WITH PROPOFOL;  Surgeon: Pasty Spillers, MD;  Location: [facility] ENDOSCOPY;  Service: Endoscopy;  Laterality: N/A;   COLONOSCOPY WITH PROPOFOL N/A 12/30/2021   Procedure: COLONOSCOPY WITH PROPOFOL;  Surgeon: Wyline Mood, MD;  Location[facility] ENDOSCOPY;  Service: Gastroenterology;  Laterality: N/A;   COLONOSCOPY WITH  PROPOFOL N/A 12/07/2022   Procedure: COLONOSCOPY WITH PROPOFOL;  Surgeon: Wyline Mood, MD;  Location[facility] ENDOSCOPY;  Service: Gastroenterology;  Laterality: N/A;   hair folicle removed     HERNIA REPAIR     INGUINAL HERNIA REPAIR Bilateral 09/29/2015   Procedure: LAPAROSCOPIC BILATERAL INGUINAL HERNIA REPAIR;  Surgeon: Earline Mayotte, MD;  Location: [facility] ORS;  Service: General;  Laterality: Bilateral;   LEFT HEART CATH AND CORONARY ANGIOGRAPHY Left 07/18/2021   Procedure: LEFT HEART CATH AND CORONARY ANGIOGRAPHY;  Surgeon: Alwyn Pea, MD;  Location: [facility] INVASIVE CV LAB;  Service: Cardiovascular;  Laterality: Left;   SKIN GRAFT Right 1966   hand    Medications:  Current Outpatient Medications on File Prior to Visit  Medication Sig   Aspirin-Acetaminophen-Caffeine (BC FAST PAIN RELIEF MAX STR) 500-500-65 MG PACK Take 1 packet by mouth daily as needed (pain.).   Blood Glucose Monitoring Suppl (ACCU-CHEK AVIVA PLUS) w/Device KIT 1 each by Does not apply route 2 (two) times daily.   clobetasol cream (TEMOVATE) 0.05 % Apply 1 application. topically 2 (two) times daily.   glucose blood (ACCU-CHEK AVIVA PLUS) test strip 1 each by Other route 2 (two) times daily.   isosorbide mononitrate (IMDUR) 30 MG 24 hr tablet Take 30 mg by mouth daily.   Multiple Vitamin (MULTIVITAMIN WITH MINERALS) TABS tablet Take 1 tablet by mouth 2 (two) times a week. One-A-Day   polyethylene glycol (GOLYTELY) 236 g solution Take 4,000 mLs by mouth 2 (two) times daily.   QC LO-DOSE ASPIRIN 81 MG tablet Take 81 mg by mouth daily.   No current facility-administered medications on file prior to visit.    Allergies:  No Known Allergies  Social History:  Social History   Socioeconomic History   Marital status: Married    Spouse name: Not on file   Number of children: Not on file   Years of education: 12   Highest education level: High school graduate  Occupational History   Not on file  Tobacco Use    Smoking status: Former    Current packs/day: 0.00    Average packs/day: 0.3 packs/day for 15.0 years (3.8 ttl pk-yrs)    Types: Cigarettes    Start date: 09/13/1965    Quit date: 09/13/1980    Years since quitting: 42.3   Smokeless tobacco: Never  Vaping Use   Vaping status: Never Used  Substance and Sexual Activity   Alcohol use: Not Currently    Comment: on occasion   Drug use: No   Sexual activity: Not on file  Other Topics Concern   Not on file  Social History Narrative   Not on file   Social Determinants of Health   Financial Resource Strain: Low Risk  (03/20/2022)   Overall Financial Resource Strain (CARDIA)    Difficulty of Paying Living Expenses: Not hard at all  Food Insecurity: No Food Insecurity (03/20/2022)   Hunger Vital Sign    Worried About Running Out of Food in the Last Year: Never true    Ran Out of Food in the Last Year: Never true  Transportation Needs: No Transportation Needs (03/20/2022)   PRAPARE - Administrator, Civil Service (Medical): No    Lack of Transportation (Non-Medical): No  Physical Activity: Insufficiently Active (03/20/2022)   Exercise Vital Sign    Days of Exercise per Week: 2 days    Minutes of Exercise per Session: 20 min  Stress: No Stress Concern Present (03/20/2022)   [employer] of Occupational Health - Occupational Stress Questionnaire    Feeling of Stress : Not at all  Social Connections: Moderately Integrated (03/20/2022)   Social Connection and Isolation Panel [NHANES]    Frequency of Communication with Friends and Family: Once a week    Frequency of Social Gatherings with Friends and Family: Twice a week    Attends Religious Services:  More than 4 times per year    Active Member of Clubs or Organizations: No    Attends Banker Meetings: Never    Marital Status: Married  Catering manager Violence: Not At Risk (03/20/2022)   Humiliation, Afraid, Rape, and Kick questionnaire    Fear of Current or Ex-Partner: No     Emotionally Abused: No    Physically Abused: No    Sexually Abused: No   Social History   Tobacco Use  Smoking Status Former   Current packs/day: 0.00   Average packs/day: 0.3 packs/day for 15.0 years (3.8 ttl pk-yrs)   Types: Cigarettes   Start date: 09/13/1965   Quit date: 09/13/1980   Years since quitting: 42.3  Smokeless Tobacco Never   Social History   Substance and Sexual Activity  Alcohol Use Not Currently   Comment: on occasion    Family History:  Family History  Problem Relation Age of Onset   Ovarian cancer Mother    Heart disease Father    Lymphoma Maternal Uncle     Past medical history, surgical history, medications, allergies, family history and social history reviewed with patient today and changes made to appropriate areas of the chart.   Review of Systems - negative All other ROS negative except what is listed above and in the HPI.      Objective:    BP 119/73   Pulse 65   Temp 97.8 F (36.6 C) (Oral)   Ht 6' 3.3" (1.913 m)   Wt 268 lb 12.8 oz (121.9 kg)   SpO2 98%   BMI 33.33 kg/m   Wt Readings from Last 3 Encounters:  01/08/23 268 lb 12.8 oz (121.9 kg)  12/07/22 259 lb 9.6 oz (117.8 kg)  10/13/22 264 lb 12.8 oz (120.1 kg)    Physical Exam Vitals and nursing note reviewed.  Constitutional:      General: He is awake. He is not in acute distress.    Appearance: He is well-developed. He is obese. He is not ill-appearing.  HENT:     Head: Normocephalic and atraumatic.     Right Ear: Hearing, tympanic membrane, ear canal and external ear normal. No drainage.     Left Ear: Hearing, tympanic membrane, ear canal and external ear normal. No drainage.     Nose: Nose normal.     Mouth/Throat:     Pharynx: Oropharynx is clear. Uvula midline.  Eyes:     General: Lids are normal.        Right eye: No discharge.        Left eye: No discharge.     Extraocular Movements: Extraocular movements intact.     Conjunctiva/sclera: Conjunctivae normal.      Pupils: Pupils are equal, round, and reactive to light.     Visual Fields: Right eye visual fields normal and left eye visual fields normal.  Neck:     Thyroid: No thyromegaly.     Vascular: No carotid bruit.  Cardiovascular:     Rate and Rhythm: Normal rate and regular rhythm.     Heart sounds: Normal heart sounds, S1 normal and S2 normal. No murmur heard.    No gallop.  Pulmonary:     Effort: Pulmonary effort is normal. No accessory muscle usage or respiratory distress.     Breath sounds: Normal breath sounds.  Abdominal:     General: Bowel sounds are normal.     Palpations: Abdomen is soft. There is no hepatomegaly or splenomegaly.  Tenderness: There is no abdominal tenderness.     Hernia: There is no hernia in the left inguinal area or right inguinal area.  Genitourinary:    Penis: Normal.      Testes: Normal.  Musculoskeletal:        General: Normal range of motion.     Cervical back: Normal range of motion and neck supple.     Right hip: Tenderness and crepitus present. No deformity or bony tenderness. Normal range of motion. Decreased strength.     Left hip: Normal.     Right lower leg: 2+ Pitting Edema present.     Left lower leg: 2+ Pitting Edema present.  Lymphadenopathy:     Head:     Right side of head: No submental, submandibular, tonsillar, preauricular or posterior auricular adenopathy.     Left side of head: No submental, submandibular, tonsillar, preauricular or posterior auricular adenopathy.     Cervical: No cervical adenopathy.  Skin:    General: Skin is warm and dry.     Capillary Refill: Capillary refill takes less than 2 seconds.     Findings: No rash.  Neurological:     Mental Status: He is alert and oriented to person, place, and time.     Gait: Gait is intact.     Deep Tendon Reflexes:     Reflex Scores:      Brachioradialis reflexes are 1+ on the right side and 1+ on the left side.      Patellar reflexes are 1+ on the right side and 1+ on  the left side. Psychiatric:        Attention and Perception: Attention normal.        Mood and Affect: Mood normal.        Speech: Speech normal.        Behavior: Behavior normal. Behavior is cooperative.        Thought Content: Thought content normal.        Cognition and Memory: Cognition normal.        Judgment: Judgment normal.    Diabetic Foot Exam - Simple   Simple Foot Form Visual Inspection See comments: Yes Sensation Testing Intact to touch and monofilament testing bilaterally: Yes Pulse Check Posterior Tibialis and Dorsalis pulse intact bilaterally: Yes Comments Thickened toenails        03/20/2022    8:30 AM 12/22/2021    9:21 AM 12/20/2020    8:42 AM 01/26/2020    2:33 PM 12/12/2019    9:44 AM  6CIT Screen  What Year? 0 points 0 points 0 points 0 points 0 points  What month? 0 points 0 points 0 points 0 points 0 points  What time? 0 points 0 points 0 points 0 points 0 points  Count back from 20 0 points 0 points 0 points 0 points 0 points  Months in reverse 0 points 0 points 0 points 0 points 0 points  Repeat phrase 2 points 0 points 0 points 0 points 0 points  Total Score 2 points 0 points 0 points 0 points 0 points   Results for orders placed or performed during the hospital encounter of 12/07/22  Surgical pathology  Result Value Ref Range   SURGICAL PATHOLOGY      SURGICAL PATHOLOGY [facility] [address], [address] [place], [place] [address] Telephone [phone] or [phone] Fax [phone]  REPORT OF SURGICAL PATHOLOGY   Accession #: [id number] Patient Name: DIONE, DONAIS Visit # :  [id number]  MRN: [id number] Physician: Wyline Mood DOB/Age 09-Oct-1945 (Age: 78) Gender: M Collected Date: 12/07/2022 Received Date: 12/07/2022  FINAL DIAGNOSIS       1. Cecum Polyp, cbx :       TUBULAR ADENOMA, 2 FRAGMENTS      NEGATIVE FOR HIGH-GRADE DYSPLASIA AND CARCINOMA       2. Ascending  Colon Polyp, x2 cold snare :        TUBULAR ADENOMA, 1 FRAGMENT      ABUNDANT VEGETATIVE MATTER CONSISTENT WITH SEED FRAGMENTS      NEGATIVE FOR HIGH-GRADE DYSPLASIA AND CARCINOMA       ELECTRONIC SIGNATURE : [place] Md, Fred , Sports administrator, International aid/development worker  MICROSCOPIC DESCRIPTION  CASE COMMENTS STAINS USED IN DIAGNOSIS: H&E H&E H&E H&E-2 H&E-3 H&E-2 H&E-3    CLINICAL HISTORY  SPECIMEN(S)  OBTAINED 1. Cecum Polyp, Cbx 2. Ascending  Colon Polyp, X2 Cold Snare  SPECIMEN COMMENTS: SPECIMEN CLINICAL INFORMATION: 1. History of colon polyps, polyps, hemorrhoids    Gross Description 1. "CBX cecal polyp", received in formalin are 2 pale tan tissue fragments that are  0.5 x 0.3 x 0.1 cm each.The specimen is submitted in 1 block (1A). 2. "Cold snare ascending colon polyp x2", received in formalin is  a 2.8 x 2.5 x 0.4 cm aggregate of scant tissue material and an abundant amount of food like material.The specimen is filtered submitted in toto in 2 block (2A-B).      AMG 12/07/2022        Report signed out from the following location(s) [facility] [address] [place], [place] [address] CLIA #: [id number]  [facility] [address] [place], [place] [address] CLIA #: [id number]       Assessment & Plan:   Problem List Items Addressed This Visit       Cardiovascular and Mediastinum   Angina pectoris (HCC) (Chronic)    Chronic, stable.  Diagnosed 07/18/21 with left heart cath performed at the time.  Continue collaboration with cardiology.  No further CP reported.  Will continue medications as ordered by cardiology.      Relevant Medications   atorvastatin (LIPITOR) 40 MG tablet   metoprolol succinate (TOPROL-XL) 25 MG 24 hr tablet   Atherosclerosis of aorta (HCC) (Chronic)    Chronic.  Noted on CT 08/17/17, recommend continued use of statin daily and daily Baby ASA 81 MG for prevention.  Recommend continued cessation of smoking, he quit in 1982.       Relevant Medications   atorvastatin (LIPITOR) 40 MG tablet   metoprolol succinate (TOPROL-XL) 25 MG 24 hr tablet   HTN (hypertension) (Chronic)    Chronic, ongoing.  BP at goal for age today.  Continue current medication regimen + collaboration with cardiology and recommend checking BP daily at home and documenting + focus on DASH diet.  Labs today: CBC, TSH, CMP.  Urine ALB 13 Jul 2022, could consider adding ARB (no ACE, previous smoker) in future for BP and proteinuria discussed with him.  Return in 6 months.      Relevant Medications   atorvastatin (LIPITOR) 40 MG tablet   metoprolol succinate (TOPROL-XL) 25 MG 24 hr tablet   Other Relevant Orders   Comprehensive metabolic panel   CBC with Differential/Platelet   TSH   PVD (peripheral vascular disease) (HCC) - Primary    Ongoing, noted on exam with decreased hair pattern, diminished pulses (1+), dry skin, edema.  Recommend use of compression  hose daily, on during day and off at night.  Monitor skin for breakdown.  Apply lotion daily -- may need steroid cream in future.      Relevant Medications   atorvastatin (LIPITOR) 40 MG tablet   metoprolol succinate (TOPROL-XL) 25 MG 24 hr tablet     Genitourinary   BPH without obstruction/lower urinary tract symptoms    Chronic, stable without medication at this time.  No further PSA due to age >90.  Discussed with patient.        Other   Elevated hemoglobin A1c (Chronic)    A1c trend down to day to 6.1%, continue diet focus at home.  Urine ALB 13 Jul 2022, could consider addition of ARB if ongoing and if BP can tolerate -- no ACE, previous smoker.  Discussed importance of monitoring diet closely + regular exercise.      Relevant Orders   Bayer DCA Hb A1c Waived   Heart burn (Chronic)    Chronic, ongoing, stable with Prilosec.  Continue current medication regimen and adjust as needed. Mag level annually. Risks of PPI use were discussed with patient including bone loss, C. Diff  diarrhea, pneumonia, infections, CKD, electrolyte abnormalities.  Verbalizes understanding and chooses to continue the medication.       Relevant Orders   Magnesium   Hyperlipidemia (Chronic)    Chronic, ongoing.  Continue daily statin and adjust dose as needed. Continue daily ASA 81 MG for prevention.  Return in 6 months.  Lipid panel today.      Relevant Medications   atorvastatin (LIPITOR) 40 MG tablet   metoprolol succinate (TOPROL-XL) 25 MG 24 hr tablet   Other Relevant Orders   Comprehensive metabolic panel   Lipid Panel w/o Chol/HDL Ratio   Obesity    BMI 33.33. Recommended eating smaller high protein, low fat meals more frequently and exercising 30 mins a day 5 times a week with a goal of 10-15lb weight loss in the next 3 months. Patient voiced their understanding and motivation to adhere to these recommendations.        Right hip pain    Ongoing for months after twisting and hearing pop in summertime.  Has tried all OTC medications with minimal benefit and stretching.  At this time will place a referral to ortho for further evaluation. Concern for tear with ongoing pain and popping sound.      Relevant Orders   Ambulatory referral to Orthopedic Surgery   Other Visit Diagnoses     Encounter for annual physical exam       Annual physical today with labs and health maintenance reviewed, discussed with patient.        Discussed aspirin prophylaxis for myocardial infarction prevention and decision was made to continue ASA  LABORATORY TESTING:  Health maintenance labs ordered today as discussed above.   The natural history of prostate cancer and ongoing controversy regarding screening and potential treatment outcomes of prostate cancer has been discussed with the patient. The meaning of a false positive PSA and a false negative PSA has been discussed. He indicates understanding of the limitations of this screening test and wishes to not proceed with screening PSA testing  due to age and guidelines.   IMMUNIZATIONS:   - Tdap: Tetanus vaccination status reviewed: last tetanus booster within 10 years. - Influenza: Up to date - Pneumovax: Up to date - Prevnar: Up to date - Zostavax vaccine: Up To Date  SCREENING: - Colonoscopy: Up to date due next 12/07/23  Discussed with patient purpose of the colonoscopy is to detect colon cancer at curable precancerous or early stages   - AAA Screening: Reports having done in past -Hearing Test: Not applicable  -Spirometry: Not applicable   PATIENT COUNSELING:    Sexuality: Discussed sexually transmitted diseases, partner selection, use of condoms, avoidance of unintended pregnancy  and contraceptive alternatives.   Advised to avoid cigarette smoking.  I discussed with the patient that most people either abstain from alcohol or drink within safe limits (<=14/week and <=4 drinks/occasion for males, <=7/weeks and <= 3 drinks/occasion for females) and that the risk for alcohol disorders and other health effects rises proportionally with the number of drinks per week and how often a drinker exceeds daily limits.  Discussed cessation/primary prevention of drug use and availability of treatment for abuse.   Diet: Encouraged to adjust caloric intake to maintain  or achieve ideal body weight, to reduce intake of dietary saturated fat and total fat, to limit sodium intake by avoiding high sodium foods and not adding table salt, and to maintain adequate dietary potassium and calcium preferably from fresh fruits, vegetables, and low-fat dairy products.    Stressed the importance of regular exercise  Injury prevention: Discussed safety belts, safety helmets, smoke detector, smoking near bedding or upholstery.   Dental health: Discussed importance of regular tooth brushing, flossing, and dental visits.   Follow up plan: NEXT PREVENTATIVE PHYSICAL DUE IN 1 YEAR. Return in about 6 months (around 07/08/2023) for HTN/HLD, IFG,  GERD.

## 2023-01-08 NOTE — Assessment & Plan Note (Signed)
Chronic, stable.  Diagnosed 07/18/21 with left heart cath performed at the time.  Continue collaboration with cardiology.  No further CP reported.  Will continue medications as ordered by cardiology.

## 2023-01-08 NOTE — Assessment & Plan Note (Signed)
BMI 33.33.  Recommended eating smaller high protein, low fat meals more frequently and exercising 30 mins a day 5 times a week with a goal of 10-15lb weight loss in the next 3 months. Patient voiced their understanding and motivation to adhere to these recommendations.

## 2023-01-08 NOTE — Assessment & Plan Note (Addendum)
Chronic, ongoing, stable with Prilosec.  Continue current medication regimen and adjust as needed. Mag level annually. Risks of PPI use were discussed with patient including bone loss, C. Diff diarrhea, pneumonia, infections, CKD, electrolyte abnormalities.  Verbalizes understanding and chooses to continue the medication.

## 2023-01-08 NOTE — Assessment & Plan Note (Signed)
Ongoing for months after twisting and hearing pop in summertime.  Has tried all OTC medications with minimal benefit and stretching.  At this time will place a referral to ortho for further evaluation. Concern for tear with ongoing pain and popping sound.

## 2023-01-08 NOTE — Assessment & Plan Note (Signed)
Ongoing, noted on exam with decreased hair pattern, diminished pulses (1+), dry skin, edema.  Recommend use of compression hose daily, on during day and off at night.  Monitor skin for breakdown.  Apply lotion daily -- may need steroid cream in future.

## 2023-01-08 NOTE — Assessment & Plan Note (Signed)
Chronic.  Noted on CT 08/17/17, recommend continued use of statin daily and daily Baby ASA 81 MG for prevention.  Recommend continued cessation of smoking, he quit in 1982.

## 2023-01-08 NOTE — Assessment & Plan Note (Addendum)
Chronic, ongoing.  BP at goal for age today.  Continue current medication regimen + collaboration with cardiology and recommend checking BP daily at home and documenting + focus on DASH diet.  Labs today: CBC, TSH, CMP.  Urine ALB 13 Jul 2022, could consider adding ARB (no ACE, previous smoker) in future for BP and proteinuria discussed with him.  Return in 6 months.

## 2023-01-08 NOTE — Assessment & Plan Note (Signed)
A1c trend down to day to 6.1%, continue diet focus at home.  Urine ALB 13 Jul 2022, could consider addition of ARB if ongoing and if BP can tolerate -- no ACE, previous smoker.  Discussed importance of monitoring diet closely + regular exercise.

## 2023-01-09 LAB — CBC WITH DIFFERENTIAL/PLATELET
Basophils Absolute: 0.1 10*3/uL (ref 0.0–0.2)
Basos: 1 %
EOS (ABSOLUTE): 0.2 10*3/uL (ref 0.0–0.4)
Eos: 5 %
Hematocrit: 42.4 % (ref 37.5–51.0)
Hemoglobin: 14 g/dL (ref 13.0–17.7)
Immature Grans (Abs): 0 10*3/uL (ref 0.0–0.1)
Immature Granulocytes: 0 %
Lymphocytes Absolute: 1.3 10*3/uL (ref 0.7–3.1)
Lymphs: 31 %
MCH: 32.3 pg (ref 26.6–33.0)
MCHC: 33 g/dL (ref 31.5–35.7)
MCV: 98 fL — ABNORMAL HIGH (ref 79–97)
Monocytes Absolute: 0.6 10*3/uL (ref 0.1–0.9)
Monocytes: 13 %
Neutrophils Absolute: 2.1 10*3/uL (ref 1.4–7.0)
Neutrophils: 50 %
Platelets: 174 10*3/uL (ref 150–450)
RBC: 4.34 x10E6/uL (ref 4.14–5.80)
RDW: 12.9 % (ref 11.6–15.4)
WBC: 4.2 10*3/uL (ref 3.4–10.8)

## 2023-01-09 LAB — COMPREHENSIVE METABOLIC PANEL
ALT: 12 [IU]/L (ref 0–44)
AST: 16 [IU]/L (ref 0–40)
Albumin: 4 g/dL (ref 3.8–4.8)
Alkaline Phosphatase: 75 [IU]/L (ref 44–121)
BUN/Creatinine Ratio: 16 (ref 10–24)
BUN: 16 mg/dL (ref 8–27)
Bilirubin Total: 0.6 mg/dL (ref 0.0–1.2)
CO2: 25 mmol/L (ref 20–29)
Calcium: 9 mg/dL (ref 8.6–10.2)
Chloride: 104 mmol/L (ref 96–106)
Creatinine, Ser: 1 mg/dL (ref 0.76–1.27)
Globulin, Total: 2.4 g/dL (ref 1.5–4.5)
Glucose: 98 mg/dL (ref 70–99)
Potassium: 4.1 mmol/L (ref 3.5–5.2)
Sodium: 141 mmol/L (ref 134–144)
Total Protein: 6.4 g/dL (ref 6.0–8.5)
eGFR: 78 mL/min/{1.73_m2} (ref >59)

## 2023-01-09 LAB — TSH: TSH: 1.34 u[IU]/mL (ref 0.450–4.500)

## 2023-01-09 LAB — LIPID PANEL W/O CHOL/HDL RATIO
Cholesterol, Total: 150 mg/dL (ref 100–199)
HDL: 64 mg/dL (ref >39)
LDL Chol Calc (NIH): 73 mg/dL (ref 0–99)
Triglycerides: 62 mg/dL (ref 0–149)
VLDL Cholesterol Cal: 13 mg/dL (ref 5–40)

## 2023-01-09 LAB — MAGNESIUM: Magnesium: 2.1 mg/dL (ref 1.6–2.3)

## 2023-01-09 NOTE — Progress Notes (Signed)
Good morning, please let [place] know his labs have returned and these overall look great.  I have no concerns on these.  Continue all current medications.  Great job!!  Any questions? Keep being stellar!!  Thank you for allowing me to participate in your care.  I appreciate you. Kindest regards, Kurtis Anastasia

## 2023-01-10 ENCOUNTER — Telehealth: Payer: Self-pay

## 2023-01-10 NOTE — Telephone Encounter (Signed)
PA approved. Called and notified patient of approval.

## 2023-01-10 NOTE — Telephone Encounter (Signed)
PA for Lidocaine patches initiated and submitted via Cover My Meds. Key: [facility]

## 2023-01-16 ENCOUNTER — Other Ambulatory Visit (HOSPITAL_BASED_OUTPATIENT_CLINIC_OR_DEPARTMENT_OTHER): Payer: Self-pay | Admitting: Orthopaedic Surgery

## 2023-01-16 DIAGNOSIS — M25551 Pain in right hip: Secondary | ICD-10-CM

## 2023-01-17 ENCOUNTER — Telehealth (HOSPITAL_BASED_OUTPATIENT_CLINIC_OR_DEPARTMENT_OTHER): Payer: Self-pay

## 2023-01-17 DIAGNOSIS — L218 Other seborrheic dermatitis: Secondary | ICD-10-CM | POA: Diagnosis not present

## 2023-01-17 DIAGNOSIS — L281 Prurigo nodularis: Secondary | ICD-10-CM | POA: Diagnosis not present

## 2023-01-17 NOTE — Telephone Encounter (Signed)
Tried calling pt, no answer and no voice. Pt needs xrays prior to appt

## 2023-01-18 ENCOUNTER — Ambulatory Visit: Payer: Medicare Other

## 2023-01-18 DIAGNOSIS — M7061 Trochanteric bursitis, right hip: Secondary | ICD-10-CM | POA: Diagnosis not present

## 2023-03-29 ENCOUNTER — Ambulatory Visit (INDEPENDENT_AMBULATORY_CARE_PROVIDER_SITE_OTHER): Payer: Medicare Other | Admitting: Emergency Medicine

## 2023-03-29 VITALS — Ht 75.5 in | Wt 270.0 lb

## 2023-03-29 DIAGNOSIS — Z0000 Encounter for general adult medical examination without abnormal findings: Secondary | ICD-10-CM | POA: Diagnosis not present

## 2023-03-29 NOTE — Patient Instructions (Addendum)
Derek Wright , Thank you for taking time to come for your Medicare Wellness Visit. I appreciate your ongoing commitment to your health goals. Please review the following plan we discussed and let me know if I can assist you in the future.   Referrals/Orders/Follow-Ups/Clinician Recommendations: Keep up the good work!!  This is a list of the screening recommended for you and due dates:  Health Maintenance  Topic Date Due   Complete foot exam   12/23/2022   COVID-19 Vaccine (6 - 2024-25 season) 04/10/2023   Yearly kidney health urinalysis for diabetes  06/22/2023   Hemoglobin A1C  07/08/2023   Eye exam for diabetics  12/22/2023   Yearly kidney function blood test for diabetes  01/08/2024   Medicare Annual Wellness Visit  03/28/2024   DTaP/Tdap/Td vaccine (3 - Td or Tdap) 01/17/2032   Pneumonia Vaccine  Completed   Flu Shot  Completed   Hepatitis C Screening  Completed   Zoster (Shingles) Vaccine  Completed   HPV Vaccine  Aged Out   Colon Cancer Screening  Discontinued    Advanced directives: (Copy Requested) Please bring a copy of your health care power of attorney and living will to the office to be added to your chart at your convenience.  Next Medicare Annual Wellness Visit scheduled for next year: Yes, 04/01/24 @ 8:00am (phone visit)

## 2023-03-29 NOTE — Progress Notes (Signed)
Subjective:   Derek Wright is a 78 y.o. male who presents for Medicare Annual/Subsequent preventive examination.  This patient declined Interactive audio and Acupuncturist. Therefore the visit was completed with audio only.   Visit Complete: Virtual I connected with  Derek Wright on 03/29/23 by a audio enabled telemedicine application and verified that I am speaking with the correct person using two identifiers.  Patient Location: Home  Provider Location: Home Office  I discussed the limitations of evaluation and management by telemedicine. The patient expressed understanding and agreed to proceed.  Vital Signs: Because this visit was a virtual/telehealth visit, some criteria may be missing or patient reported. Any vitals not documented were not able to be obtained and vitals that have been documented are patient reported.   Cardiac Risk Factors include: advanced age (>36men, >77 women);male gender;hypertension;dyslipidemia;obesity (BMI >30kg/m2);Other (see comment), Risk factor comments: prediabetic     Objective:    Today's Vitals   03/29/23 0801  Weight: 270 lb (122.5 kg)  Height: 6' 3.5" (1.918 m)  PainSc: 4    Body mass index is 33.3 kg/m.     03/29/2023    8:15 AM 12/07/2022    9:34 AM 03/20/2022    8:23 AM 12/30/2021    6:49 AM 07/18/2021   11:54 AM 01/26/2021    1:01 PM 10/15/2020    3:11 PM  Advanced Directives  Does Patient Have a Medical Advance Directive? Yes No No Yes Yes No No  Type of Estate agent of [employer];Living will   Healthcare Power of [place];Living will     Does patient want to make changes to medical advance directive? No - Patient declined    No - Patient declined    Copy of Healthcare Power of Attorney in Chart? No - copy requested   No - copy requested     Would patient like information on creating a medical advance directive?  No - Patient declined No - Patient declined   No - Patient declined No - Patient  declined    Current Medications (verified) Outpatient Encounter Medications as of 03/29/2023  Medication Sig   Aspirin-Acetaminophen-Caffeine (BC FAST PAIN RELIEF MAX STR) 500-500-65 MG PACK Take 1 packet by mouth daily as needed (pain.).   atorvastatin (LIPITOR) 40 MG tablet Take 1 tablet (40 mg total) by mouth daily.   clindamycin (CLEOCIN T) 1 % external solution Apply 1 Application topically 2 (two) times daily. prn   clobetasol cream (TEMOVATE) 0.05 % Apply 1 application. topically 2 (two) times daily.   isosorbide mononitrate (IMDUR) 30 MG 24 hr tablet Take 30 mg by mouth daily.   ketoconazole (NIZORAL) 2 % cream Apply 1 Application topically 2 (two) times daily. prn   lidocaine (LIDODERM) 5 % Place 1 patch onto the skin daily. Remove & Discard patch within 12 hours or as directed by MD   meloxicam (MOBIC) 15 MG tablet Take 15 mg by mouth daily. prn   metoprolol succinate (TOPROL-XL) 25 MG 24 hr tablet TAKE ONE TABLET DAILY BY MOUTH.   omeprazole (PRILOSEC) 40 MG capsule Take 1 capsule (40 mg total) by mouth in the morning.   polyethylene glycol (GOLYTELY) 236 g solution Take 4,000 mLs by mouth 2 (two) times daily.   QC LO-DOSE ASPIRIN 81 MG tablet Take 81 mg by mouth daily.   Blood Glucose Monitoring Suppl (ACCU-CHEK AVIVA PLUS) w/Device KIT 1 each by Does not apply route 2 (two) times daily. (Patient not taking: Reported on  03/29/2023)   glucose blood (ACCU-CHEK AVIVA PLUS) test strip 1 each by Other route 2 (two) times daily. (Patient not taking: Reported on 03/29/2023)   Multiple Vitamin (MULTIVITAMIN WITH MINERALS) TABS tablet Take 1 tablet by mouth 2 (two) times a week. One-A-Day (Patient not taking: Reported on 03/29/2023)   No facility-administered encounter medications on file as of 03/29/2023.    Allergies (verified) Patient has no known allergies.   History: Past Medical History:  Diagnosis Date   Arthritis    GERD (gastroesophageal reflux disease)    RARE-NO MEDS    Hyperlipidemia    Hypertension    Myocardial infarction (HCC)    Pre-diabetes    Sleep apnea    MILD-NO CPAP-COULD NOT TOELERATE   Status post surgical removal of nail matrix of toe of left foot    Status post surgical removal of nail matrix of toe of right foot    Past Surgical History:  Procedure Laterality Date   COLONOSCOPY  2012   COLONOSCOPY WITH PROPOFOL N/A 07/20/2020   Procedure: COLONOSCOPY WITH PROPOFOL;  Surgeon: Pasty Spillers, MD;  Location: [facility] ENDOSCOPY;  Service: Endoscopy;  Laterality: N/A;   COLONOSCOPY WITH PROPOFOL N/A 12/30/2021   Procedure: COLONOSCOPY WITH PROPOFOL;  Surgeon: Wyline Mood, MD;  Location: [facility] ENDOSCOPY;  Service: Gastroenterology;  Laterality: N/A;   COLONOSCOPY WITH PROPOFOL N/A 12/07/2022   Procedure: COLONOSCOPY WITH PROPOFOL;  Surgeon: Wyline Mood, MD;  Location: [facility] ENDOSCOPY;  Service: Gastroenterology;  Laterality: N/A;   hair folicle removed     HERNIA REPAIR     INGUINAL HERNIA REPAIR Bilateral 09/29/2015   Procedure: LAPAROSCOPIC BILATERAL INGUINAL HERNIA REPAIR;  Surgeon: Earline Mayotte, MD;  Location: [facility] ORS;  Service: General;  Laterality: Bilateral;   LEFT HEART CATH AND CORONARY ANGIOGRAPHY Left 07/18/2021   Procedure: LEFT HEART CATH AND CORONARY ANGIOGRAPHY;  Surgeon: Alwyn Pea, MD;  Location: [facility] INVASIVE CV LAB;  Service: Cardiovascular;  Laterality: Left;   SKIN GRAFT Right 1966   hand   Family History  Problem Relation Age of Onset   Ovarian cancer Mother    Heart disease Father    Lymphoma Maternal Uncle    Social History   Socioeconomic History   Marital status: Widowed    Spouse name: Not on file   Number of children: 1   Years of education: 19   Highest education level: High school graduate  Occupational History   Not on file  Tobacco Use   Smoking status: Former    Current packs/day: 0.00    Average packs/day: 0.3 packs/day for 15.0 years (3.8 ttl pk-yrs)    Types: Cigarettes    Start  date: 09/13/1965    Quit date: 09/13/1980    Years since quitting: 42.5   Smokeless tobacco: Never  Vaping Use   Vaping status: Never Used  Substance and Sexual Activity   Alcohol use: Yes    Alcohol/week: 1.0 - 2.0 standard drink of alcohol    Types: 1 - 2 Standard drinks or equivalent per week    Comment: 1 drink 1-2 times per week   Drug use: No   Sexual activity: Not on file  Other Topics Concern   Not on file  Social History Narrative   Wife passed away 2022/06/13   Social Drivers of Health   Financial Resource Strain: Low Risk  (03/29/2023)   Overall Financial Resource Strain (CARDIA)    Difficulty of Paying Living Expenses: Not hard at all  Food Insecurity:  No Food Insecurity (03/29/2023)   Hunger Vital Sign    Worried About Running Out of Food in the Last Year: Never true    Ran Out of Food in the Last Year: Never true  Transportation Needs: No Transportation Needs (03/29/2023)   PRAPARE - Administrator, Civil Service (Medical): No    Lack of Transportation (Non-Medical): No  Physical Activity: Insufficiently Active (03/29/2023)   Exercise Vital Sign    Days of Exercise per Week: 2 days    Minutes of Exercise per Session: 20 min  Stress: No Stress Concern Present (03/29/2023)   [employer] of Occupational Health - Occupational Stress Questionnaire    Feeling of Stress : Not at all  Social Connections: Moderately Isolated (03/29/2023)   Social Connection and Isolation Panel [NHANES]    Frequency of Communication with Friends and Family: Three times a week    Frequency of Social Gatherings with Friends and Family: More than three times a week    Attends Religious Services: More than 4 times per year    Active Member of Clubs or Organizations: No    Attends Banker Meetings: Never    Marital Status: Widowed    Tobacco Counseling Counseling given: Not Answered   Clinical Intake:  Pre-visit preparation completed: Yes  Pain : 0-10 Pain  Score: 4  Pain Type: Chronic pain Pain Location: Hip Pain Orientation: Right Pain Descriptors / Indicators: Aching     BMI - recorded: 33.3 Nutritional Status: BMI > 30  Obese Diabetes: No  How often do you need to have someone help you when you read instructions, pamphlets, or other written materials from your doctor or pharmacy?: 1 - Never  Interpreter Needed?: No  Information entered by :: Tora Kindred, CMA   Activities of Daily Living    03/29/2023    8:03 AM 01/08/2023   10:04 AM  In your present state of health, do you have any difficulty performing the following activities:  Hearing? 0 0  Vision? 0 0  Difficulty concentrating or making decisions? 0 0  Walking or climbing stairs? 1 0  Comment sometimes, when hip hurts   Dressing or bathing? 0 0  Doing errands, shopping? 0 0  Preparing Food and eating ? N   Using the Toilet? N   In the past six months, have you accidently leaked urine? N   Do you have problems with loss of bowel control? N   Managing your Medications? N   Managing your Finances? N   Housekeeping or managing your Housekeeping? N     Patient Care Team: Marjie Skiff, NP as PCP - General (Nurse Practitioner) Candice Camp, MD (Ophthalmology) Meryle Ready, MD (General Surgery) Alwyn Pea, MD as Consulting Physician (Cardiology) Pa, [facility] (Optometry)  Indicate any recent Medical Services you may have received from other than [facility] providers in the past year (date may be approximate).     Assessment:   This is a routine wellness examination for Soul.  Hearing/Vision screen Hearing Screening - Comments:: Denies hearing loss Vision Screening - Comments:: Gets eye exams, [facility] [place]   Goals Addressed               This Visit's Progress     Patient Stated (pt-stated)        Exercise more      Depression Screen    03/29/2023    8:12 AM 01/08/2023    9:42 AM 10/13/2022  3:11 PM 06/22/2022     8:39 AM 03/20/2022    8:22 AM 12/22/2021    8:58 AM 08/24/2021    4:23 PM  PHQ 2/9 Scores  PHQ - 2 Score 0 0 0 0 0 0 0  PHQ- 9 Score  0 0 0 0 0 0    Fall Risk    03/29/2023    8:15 AM 01/08/2023    9:42 AM 10/13/2022    3:10 PM 06/22/2022    8:39 AM 03/20/2022    8:24 AM  Fall Risk   Falls in the past year? 0 0 0 0 1  Number falls in past yr: 0 0 0 0 1  Injury with Fall? 0 0 0 0 0  Risk for fall due to : No Fall Risks No Fall Risks No Fall Risks No Fall Risks History of fall(s)  Follow up Falls prevention discussed;Falls evaluation completed Falls evaluation completed Falls evaluation completed Falls evaluation completed Falls evaluation completed;Falls prevention discussed    MEDICARE RISK AT HOME: Medicare Risk at Home Any stairs in or around the home?: Yes If so, are there any without handrails?: No Home free of loose throw rugs in walkways, pet beds, electrical cords, etc?: Yes Adequate lighting in your home to reduce risk of falls?: Yes Life alert?: No Use of a cane, walker or w/c?: No Grab bars in the bathroom?: No Shower chair or bench in shower?: No Elevated toilet seat or a handicapped toilet?: Yes  TIMED UP AND GO:  Was the test performed?  No    Cognitive Function:        03/29/2023    8:16 AM 03/20/2022    8:30 AM 12/22/2021    9:21 AM 12/20/2020    8:42 AM 01/26/2020    2:33 PM  6CIT Screen  What Year? 0 points 0 points 0 points 0 points 0 points  What month? 0 points 0 points 0 points 0 points 0 points  What time? 0 points 0 points 0 points 0 points 0 points  Count back from 20 0 points 0 points 0 points 0 points 0 points  Months in reverse 0 points 0 points 0 points 0 points 0 points  Repeat phrase 2 points 2 points 0 points 0 points 0 points  Total Score 2 points 2 points 0 points 0 points 0 points    Immunizations Immunization History  Administered Date(s) Administered   Fluad Quad(high Dose 65+) 10/09/2018, 10/27/2019, 11/14/2019   Fluad  Trivalent(High Dose 65+) 10/13/2022   Influenza,inj,Quad PF,6+ Mos 11/21/2016   Influenza,inj,quad, With Preservative 12/01/2016   Influenza-Unspecified 11/12/2013, 12/29/2014, 11/01/2015, 11/14/2017, 11/13/2020, 12/11/2021   Moderna Covid-19 Fall Seasonal Vaccine 57yrs & older 12/12/2021, 02/13/2023   Moderna Sars-Covid-2 Vaccination 03/28/2019, 05/03/2019, 12/11/2021   PFIZER(Purple Top)SARS-COV-2 Vaccination 12/23/2019   Pneumococcal Conjugate-13 09/13/2015   Pneumococcal Polysaccharide-23 03/25/2009, 08/20/2017   Pneumococcal-Unspecified 03/25/2009   Td 01/16/2022   Tdap 05/08/2013   Zoster Recombinant(Shingrix) 12/22/2021, 03/28/2022   Zoster, Live 09/25/2011    TDAP status: Up to date  Flu Vaccine status: Up to date  Pneumococcal vaccine status: Up to date  Covid-19 vaccine status: Completed vaccines  Qualifies for Shingles Vaccine? Yes   Zostavax completed Yes   Shingrix Completed?: Yes  Screening Tests Health Maintenance  Topic Date Due   FOOT EXAM  12/23/2022   COVID-19 Vaccine (6 - 2024-25 season) 04/10/2023   Diabetic kidney evaluation - Urine ACR  06/22/2023   HEMOGLOBIN A1C  07/08/2023  OPHTHALMOLOGY EXAM  12/22/2023   Diabetic kidney evaluation - eGFR measurement  01/08/2024   Medicare Annual Wellness (AWV)  03/28/2024   DTaP/Tdap/Td (3 - Td or Tdap) 01/17/2032   Pneumonia Vaccine 47+ Years old  Completed   INFLUENZA VACCINE  Completed   Hepatitis C Screening  Completed   Zoster Vaccines- Shingrix  Completed   HPV VACCINES  Aged Out   Colonoscopy  Discontinued    Health Maintenance  Health Maintenance Due  Topic Date Due   FOOT EXAM  12/23/2022    Colorectal cancer screening: No longer required.   Lung Cancer Screening: (Low Dose CT Chest recommended if Age 8-80 years, 20 pack-year currently smoking OR have quit w/in 15years.) does not qualify.   Lung Cancer Screening Referral: n/a  Additional Screening:  Hepatitis C Screening: does not  qualify; Completed 09/13/15  Vision Screening: Recommended annual ophthalmology exams for early detection of glaucoma and other disorders of the eye. Is the patient up to date with their annual eye exam?  Yes  Who is the provider or what is the name of the office in which the patient attends annual eye exams? [facility] If pt is not established with a provider, would they like to be referred to a provider to establish care? No .   Dental Screening: Recommended annual dental exams for proper oral hygiene   Community Resource Referral / Chronic Care Management: CRR required this visit?  No   CCM required this visit?  No     Plan:     I have personally reviewed and noted the following in the patient's chart:   Medical and social history Use of alcohol, tobacco or illicit drugs  Current medications and supplements including opioid prescriptions. Patient is not currently taking opioid prescriptions. Functional ability and status Nutritional status Physical activity Advanced directives List of other physicians Hospitalizations, surgeries, and ER visits in previous 12 months Vitals Screenings to include cognitive, depression, and falls Referrals and appointments  In addition, I have reviewed and discussed with patient certain preventive protocols, quality metrics, and best practice recommendations. A written personalized care plan for preventive services as well as general preventive health recommendations were provided to patient.     Tora Kindred, CMA   03/29/2023   After Visit Summary: (Mail) Due to this being a telephonic visit, the after visit summary with patients personalized plan was offered to patient via mail   Nurse Notes:  6 CIT Score - 2

## 2023-05-25 DIAGNOSIS — K219 Gastro-esophageal reflux disease without esophagitis: Secondary | ICD-10-CM | POA: Diagnosis not present

## 2023-05-25 DIAGNOSIS — E782 Mixed hyperlipidemia: Secondary | ICD-10-CM | POA: Diagnosis not present

## 2023-05-25 DIAGNOSIS — E119 Type 2 diabetes mellitus without complications: Secondary | ICD-10-CM | POA: Diagnosis not present

## 2023-05-25 DIAGNOSIS — G4733 Obstructive sleep apnea (adult) (pediatric): Secondary | ICD-10-CM | POA: Diagnosis not present

## 2023-05-25 DIAGNOSIS — R0602 Shortness of breath: Secondary | ICD-10-CM | POA: Diagnosis not present

## 2023-05-25 DIAGNOSIS — I10 Essential (primary) hypertension: Secondary | ICD-10-CM | POA: Diagnosis not present

## 2023-07-08 NOTE — Patient Instructions (Incomplete)
 Be Involved in Caring For Your Health:  Taking Medications When medications are taken as directed, they can greatly improve your health. But if they are not taken as prescribed, they may not work. In some cases, not taking them correctly can be harmful. To help ensure your treatment remains effective and safe, understand your medications and how to take them. Bring your medications to each visit for review by your provider.  Your lab results, notes, and after visit summary will be available on My Chart. We strongly encourage you to use this feature. If lab results are abnormal the clinic will contact you with the appropriate steps. If the clinic does not contact you assume the results are satisfactory. You can always view your results on My Chart. If you have questions regarding your health or results, please contact the clinic during office hours. You can also ask questions on My Chart.  We at [facility] are grateful that you chose [place] to provide your care. We strive to provide evidence-based and compassionate care and are always looking for feedback. If you get a survey from the clinic please complete this so we can hear your opinions.  Prediabetes: What to Know Prediabetes is when your blood sugar, also called glucose, is at a higher level than normal but not high enough for you to be diagnosed with type 2 diabetes (type 2 diabetes mellitus). Having prediabetes puts you at risk for getting type 2 diabetes. By making some healthy changes, you may be able to prevent or delay getting type 2 diabetes. This is important because type 2 diabetes can lead to serious problems. Some of these include: Heart disease. Stroke. Blindness. Kidney disease. Depression. Poor blood flow in the feet and legs. In very bad cases, this could lead to having a leg removed by surgery (amputation). What are the causes? The exact cause of prediabetes isn't known. It may result from insulin resistance. Insulin  resistance happens when cells in the body don't respond properly to insulin that the body makes. This can cause too much sugar to build up in the blood. High blood sugar, also called hyperglycemia, can develop. What increases the risk? Having a family member with type 2 diabetes. Being older than 76 years of age. Having had a temporary form of diabetes during a pregnancy. This is called gestational diabetes. Having had polycystic ovary syndrome (PCOS). Being overweight or obese. Being inactive and not getting much exercise. Having a history of heart disease. This may include problems with cholesterol levels, high levels of blood fats, or high blood pressure. What are the signs or symptoms? You may have no symptoms. If you do have symptoms, they may include: Increased hunger. Increased thirst. Needing to pee more often. Changes in how you see, like blurry vision. Feeling tired. How is this diagnosed? Prediabetes can be diagnosed with blood tests that check your blood sugar. One or more of these tests may be done: A fasting blood glucose (FBG) test. You won't be allowed to eat (you will fast) for at least 8 hours before a blood sample is taken. An A1C blood test, also called a hemoglobin A1C test. This test shows information about blood sugar levels over the past 2?3 months. An oral glucose tolerance test (OGTT). This test measures your blood sugar at two points in time: After you haven't eaten for a while. This is your baseline level. Two hours after you drink a beverage that has sugar in it. You may be diagnosed with prediabetes  if: Your FBG is 100?125 mg/dL (1.6-1.0 mmol/L). Your A1C level is 5.7?6.4% (39-46 mmol/mol). Your OGTT result is 140?199 mg/dL (9.6-04 mmol/L). These blood tests may need to be done again to be sure of the diagnosis. How is this treated? Treatment may include making changes to your diet and lifestyle. These changes can help lower your blood sugar and keep you  from getting type 2 diabetes. In some cases, medicine may be given to help lower your risk. Follow these instructions at home: Eating and drinking  Eat and drink as told. Follow a healthy meal plan. This includes eating lean proteins, whole grains, legumes, fresh fruits and vegetables, low-fat dairy products, and healthy fats. Meet with an expert in healthy eating called a dietitian. This person can help create a healthy eating plan that's right for you. Lifestyle Do moderate-intensity exercise. Do this for at least 30 minutes a day on 5 or more days each week, or as told by your health care provider. A mix of activities may be best. Good choices include brisk walking, swimming, biking, and weight lifting. Try to lose weight if your provider says it's OK. Losing 5-7% of your body weight can help reverse insulin resistance. Do not drink alcohol if: Your provider tells you not to drink. You're pregnant, may be pregnant, or plan to become pregnant. If you drink alcohol: Limit how much you have to: 0-1 drink a day if you're male. 0-2 drinks a day if you're male. Know how much alcohol is in your drink. In the [place] one drink is one 12 oz bottle of beer (355 mL), one 5 oz glass of wine (148 mL), or one 1 oz glass of hard liquor (44 mL). General instructions Take medicines only as told. You may be given medicines that help lower the risk of type 2 diabetes. Do not smoke, vape, or use nicotine or tobacco. Where to find more information American Diabetes Association: diabetes.org/about-diabetes/prediabetes Academy of Nutrition and Dietetics: eatright.org American Heart Association: Go to ThisJobs.cz. Click the search icon. Type "prediabetes" in the search box. Contact a health care provider if: You have any of these symptoms: Increased hunger. Peeing more often than usual. Increased thirst. Feeling tired. Changes in how you see, like blurry vision. Feeling like you may throw  up. Throwing up. Get help right away if: You have shortness of breath. You feel confused. This information is not intended to replace advice given to you by your health care provider. Make sure you discuss any questions you have with your health care provider. Document Revised: 09/03/2022 Document Reviewed: 09/03/2022 Elsevier Patient Education  2024 [employer].

## 2023-07-10 ENCOUNTER — Ambulatory Visit: Payer: Self-pay | Admitting: Nurse Practitioner

## 2023-07-10 DIAGNOSIS — E66811 Obesity, class 1: Secondary | ICD-10-CM

## 2023-07-10 DIAGNOSIS — R7309 Other abnormal glucose: Secondary | ICD-10-CM

## 2023-07-10 DIAGNOSIS — I739 Peripheral vascular disease, unspecified: Secondary | ICD-10-CM

## 2023-07-10 DIAGNOSIS — I209 Angina pectoris, unspecified: Secondary | ICD-10-CM

## 2023-07-10 DIAGNOSIS — I700 Atherosclerosis of aorta: Secondary | ICD-10-CM

## 2023-07-10 DIAGNOSIS — I10 Essential (primary) hypertension: Secondary | ICD-10-CM

## 2023-07-10 DIAGNOSIS — E6609 Other obesity due to excess calories: Secondary | ICD-10-CM

## 2023-07-10 DIAGNOSIS — E782 Mixed hyperlipidemia: Secondary | ICD-10-CM

## 2023-07-15 NOTE — Patient Instructions (Signed)
 Be Involved in Caring For Your Health:  Taking Medications When medications are taken as directed, they can greatly improve your health. But if they are not taken as prescribed, they may not work. In some cases, not taking them correctly can be harmful. To help ensure your treatment remains effective and safe, understand your medications and how to take them. Bring your medications to each visit for review by your provider.  Your lab results, notes, and after visit summary will be available on My Chart. We strongly encourage you to use this feature. If lab results are abnormal the clinic will contact you with the appropriate steps. If the clinic does not contact you assume the results are satisfactory. You can always view your results on My Chart. If you have questions regarding your health or results, please contact the clinic during office hours. You can also ask questions on My Chart.  We at [facility] are grateful that you chose [place] to provide your care. We strive to provide evidence-based and compassionate care and are always looking for feedback. If you get a survey from the clinic please complete this so we can hear your opinions.  Prediabetes: What to Know Prediabetes is when your blood sugar, also called glucose, is at a higher level than normal but not high enough for you to be diagnosed with type 2 diabetes (type 2 diabetes mellitus). Having prediabetes puts you at risk for getting type 2 diabetes. By making some healthy changes, you may be able to prevent or delay getting type 2 diabetes. This is important because type 2 diabetes can lead to serious problems. Some of these include: Heart disease. Stroke. Blindness. Kidney disease. Depression. Poor blood flow in the feet and legs. In very bad cases, this could lead to having a leg removed by surgery (amputation). What are the causes? The exact cause of prediabetes isn't known. It may result from insulin resistance. Insulin  resistance happens when cells in the body don't respond properly to insulin that the body makes. This can cause too much sugar to build up in the blood. High blood sugar, also called hyperglycemia, can develop. What increases the risk? Having a family member with type 2 diabetes. Being older than 76 years of age. Having had a temporary form of diabetes during a pregnancy. This is called gestational diabetes. Having had polycystic ovary syndrome (PCOS). Being overweight or obese. Being inactive and not getting much exercise. Having a history of heart disease. This may include problems with cholesterol levels, high levels of blood fats, or high blood pressure. What are the signs or symptoms? You may have no symptoms. If you do have symptoms, they may include: Increased hunger. Increased thirst. Needing to pee more often. Changes in how you see, like blurry vision. Feeling tired. How is this diagnosed? Prediabetes can be diagnosed with blood tests that check your blood sugar. One or more of these tests may be done: A fasting blood glucose (FBG) test. You won't be allowed to eat (you will fast) for at least 8 hours before a blood sample is taken. An A1C blood test, also called a hemoglobin A1C test. This test shows information about blood sugar levels over the past 2?3 months. An oral glucose tolerance test (OGTT). This test measures your blood sugar at two points in time: After you haven't eaten for a while. This is your baseline level. Two hours after you drink a beverage that has sugar in it. You may be diagnosed with prediabetes  if: Your FBG is 100?125 mg/dL (1.6-1.0 mmol/L). Your A1C level is 5.7?6.4% (39-46 mmol/mol). Your OGTT result is 140?199 mg/dL (9.6-04 mmol/L). These blood tests may need to be done again to be sure of the diagnosis. How is this treated? Treatment may include making changes to your diet and lifestyle. These changes can help lower your blood sugar and keep you  from getting type 2 diabetes. In some cases, medicine may be given to help lower your risk. Follow these instructions at home: Eating and drinking  Eat and drink as told. Follow a healthy meal plan. This includes eating lean proteins, whole grains, legumes, fresh fruits and vegetables, low-fat dairy products, and healthy fats. Meet with an expert in healthy eating called a dietitian. This person can help create a healthy eating plan that's right for you. Lifestyle Do moderate-intensity exercise. Do this for at least 30 minutes a day on 5 or more days each week, or as told by your health care provider. A mix of activities may be best. Good choices include brisk walking, swimming, biking, and weight lifting. Try to lose weight if your provider says it's OK. Losing 5-7% of your body weight can help reverse insulin resistance. Do not drink alcohol if: Your provider tells you not to drink. You're pregnant, may be pregnant, or plan to become pregnant. If you drink alcohol: Limit how much you have to: 0-1 drink a day if you're male. 0-2 drinks a day if you're male. Know how much alcohol is in your drink. In the [place] one drink is one 12 oz bottle of beer (355 mL), one 5 oz glass of wine (148 mL), or one 1 oz glass of hard liquor (44 mL). General instructions Take medicines only as told. You may be given medicines that help lower the risk of type 2 diabetes. Do not smoke, vape, or use nicotine or tobacco. Where to find more information American Diabetes Association: diabetes.org/about-diabetes/prediabetes Academy of Nutrition and Dietetics: eatright.org American Heart Association: Go to ThisJobs.cz. Click the search icon. Type "prediabetes" in the search box. Contact a health care provider if: You have any of these symptoms: Increased hunger. Peeing more often than usual. Increased thirst. Feeling tired. Changes in how you see, like blurry vision. Feeling like you may throw  up. Throwing up. Get help right away if: You have shortness of breath. You feel confused. This information is not intended to replace advice given to you by your health care provider. Make sure you discuss any questions you have with your health care provider. Document Revised: 09/03/2022 Document Reviewed: 09/03/2022 Elsevier Patient Education  2024 [employer].

## 2023-07-20 ENCOUNTER — Ambulatory Visit (INDEPENDENT_AMBULATORY_CARE_PROVIDER_SITE_OTHER): Admitting: Nurse Practitioner

## 2023-07-20 ENCOUNTER — Encounter: Payer: Self-pay | Admitting: Nurse Practitioner

## 2023-07-20 VITALS — BP 121/64 | HR 58 | Temp 97.9°F | Ht 75.2 in | Wt 267.2 lb

## 2023-07-20 DIAGNOSIS — E782 Mixed hyperlipidemia: Secondary | ICD-10-CM | POA: Diagnosis not present

## 2023-07-20 DIAGNOSIS — I10 Essential (primary) hypertension: Secondary | ICD-10-CM

## 2023-07-20 DIAGNOSIS — I700 Atherosclerosis of aorta: Secondary | ICD-10-CM | POA: Diagnosis not present

## 2023-07-20 DIAGNOSIS — N400 Enlarged prostate without lower urinary tract symptoms: Secondary | ICD-10-CM

## 2023-07-20 DIAGNOSIS — R7309 Other abnormal glucose: Secondary | ICD-10-CM | POA: Diagnosis not present

## 2023-07-20 DIAGNOSIS — I209 Angina pectoris, unspecified: Secondary | ICD-10-CM

## 2023-07-20 DIAGNOSIS — M542 Cervicalgia: Secondary | ICD-10-CM

## 2023-07-20 DIAGNOSIS — E6609 Other obesity due to excess calories: Secondary | ICD-10-CM

## 2023-07-20 DIAGNOSIS — Z6831 Body mass index (BMI) 31.0-31.9, adult: Secondary | ICD-10-CM

## 2023-07-20 DIAGNOSIS — E66811 Obesity, class 1: Secondary | ICD-10-CM

## 2023-07-20 DIAGNOSIS — M25551 Pain in right hip: Secondary | ICD-10-CM

## 2023-07-20 DIAGNOSIS — I739 Peripheral vascular disease, unspecified: Secondary | ICD-10-CM | POA: Diagnosis not present

## 2023-07-20 LAB — MICROALBUMIN, URINE WAIVED
Creatinine, Urine Waived: 100 mg/dL (ref 10–300)
Microalb, Ur Waived: 30 mg/L — ABNORMAL HIGH (ref 0–19)
Microalb/Creat Ratio: <30 mg/g

## 2023-07-20 LAB — BAYER DCA HB A1C WAIVED: HB A1C (BAYER DCA - WAIVED): 5.9 % — ABNORMAL HIGH (ref 4.8–5.6)

## 2023-07-20 NOTE — Assessment & Plan Note (Signed)
 Chronic, ongoing.  BP at goal for age today.  Continue current medication regimen + collaboration with cardiology and recommend checking BP daily at home and documenting + focus on DASH diet.  Labs today: urine ALB and CMP.  Urine ALB 13 August 2023, could consider adding ARB (no ACE, previous smoker) in future for BP and proteinuria discussed with him.  Return in 6 months.

## 2023-07-20 NOTE — Assessment & Plan Note (Signed)
 Ongoing for months with no improvement.  Has tried all OTC medications with minimal benefit and stretching.  He requests a referral to [facility], saw [facility] in past and prefers [facility]. Concern for tear with ongoing pain and popping sound.

## 2023-07-20 NOTE — Assessment & Plan Note (Signed)
 Chronic, ongoing.  Continue daily statin and adjust dose as needed. Continue daily ASA 81 MG for prevention.  Return in 6 months.  Lipid panel today.

## 2023-07-20 NOTE — Progress Notes (Signed)
 BP 121/64   Pulse (!) 58   Temp 97.9 F (36.6 C) (Oral)   Ht 6' 3.2" (1.91 m)   Wt 267 lb 3.2 oz (121.2 kg)   SpO2 98%   BMI 33.22 kg/m    Subjective:    [name], male    DOB: 08-23-1945, 78 y.o.   MRN: [id number]  HPI: Derek Wright is a 78 y.o. male  Chief Complaint  Patient presents with   Gastroesophageal Reflux   Hyperlipidemia   Hypertension   IFG   Would like to go to a different orthopedic provider due to ongoing right hip and neck pain.  HYPERTENSION/HLD Left heart catheterization on 07/18/21 due to angina pain -- taking ASA and Imdur. No more chest pain since that time.  Taking Metoprolol  XL and Atorvastatin  for HLD.  Last saw cardiology on 05/25/23, they continue to recommend sleep study..  Aortic atherosclerosis noted on CT imaging 08/17/17. Hypertension status: stable  Satisfied with current treatment? yes Duration of hypertension: chronic BP monitoring frequency:  not checking BP range:  BP medication side effects:  no Medication compliance: good compliance Aspirin : no Recurrent headaches: no Visual changes: no Palpitations: no Dyspnea: no Chest pain: no Lower extremity edema: yes, at baseline, no worse Dizzy/lightheaded: no  The 10-year ASCVD risk score (Arnett DK, et al., 2019) is: 31.3%   Values used to calculate the score:     Age: 75 years     Sex: Male     Is Non-Hispanic African American: Yes     Diabetic: Yes     Tobacco smoker: No     Systolic Blood Pressure: 121 mmHg     Is BP treated: Yes     HDL Cholesterol: 64 mg/dL     Total Cholesterol: 150 mg/dL  BPH No current medications. He would like to continue yearly PSA checks, we have discussed guidelines on this. BPH status: stable Duration: years Nocturia: 1/night Urinary frequency:no Incomplete voiding: no Urgency: occasional Weak urinary stream: no Straining to start stream: no Dysuria: no Onset: gradual Severity: mild Alleviating factors: none Aggravating  factors: none Treatments attempted: none IPSS Questionnaire (AUA-7): 3 Over the past month.   1)  How often have you had a sensation of not emptying your bladder completely after you finish urinating?  0 - Not at all  2)  How often have you had to urinate again less than two hours after you finished urinating? 0 - Not at all  3)  How often have you found you stopped and started again several times when you urinated?  0 - Not at all  4) How difficult have you found it to postpone urination?  2 - Less than half the time  5) How often have you had a weak urinary stream?  0 - Not at all  6) How often have you had to push or strain to begin urination?  0 - Not at all  7) How many times did you most typically get up to urinate from the time you went to bed until the time you got up in the morning?  1 - 1 time  Total score:  0-7 mildly symptomatic   8-19 moderately symptomatic   20-35 severely symptomatic     Impaired Fasting Glucose HbA1C:  Lab Results  Component Value Date   HGBA1C 5.9 (H) 07/20/2023  Duration of elevated blood sugar: years Polydipsia: no Polyuria: no Weight change: no Visual disturbance: no Glucose Monitoring: no  Accucheck frequency: Not Checking    Fasting glucose:     Post prandial:  Diabetic Education: Not Completed Family history of diabetes: yes   Relevant past medical, surgical, family and social history reviewed and updated as indicated. Interim medical history since our last visit reviewed. Allergies and medications reviewed and updated.  Review of Systems  Constitutional:  Negative for activity change, diaphoresis, fatigue and fever.  Respiratory:  Negative for cough, chest tightness, shortness of breath and wheezing.   Cardiovascular:  Negative for chest pain, palpitations and leg swelling.  Gastrointestinal: Negative.   Endocrine: Negative for polydipsia, polyphagia and polyuria.  Musculoskeletal:  Positive for arthralgias and neck pain.   Neurological: Negative.   Psychiatric/Behavioral: Negative.      Per HPI unless specifically indicated above     Objective:     BP 121/64   Pulse (!) 58   Temp 97.9 F (36.6 C) (Oral)   Ht 6' 3.2" (1.91 m)   Wt 267 lb 3.2 oz (121.2 kg)   SpO2 98%   BMI 33.22 kg/m   Wt Readings from Last 3 Encounters:  07/20/23 267 lb 3.2 oz (121.2 kg)  03/29/23 270 lb (122.5 kg)  01/08/23 268 lb 12.8 oz (121.9 kg)    Physical Exam Vitals and nursing note reviewed.  Constitutional:      General: He is awake. He is not in acute distress.    Appearance: He is well-developed. He is obese. He is not ill-appearing.  HENT:     Head: Normocephalic and atraumatic.     Right Ear: Hearing, tympanic membrane, ear canal and external ear normal. No drainage.     Left Ear: Hearing, tympanic membrane, ear canal and external ear normal. No drainage.     Nose: Nose normal.     Mouth/Throat:     Pharynx: Oropharynx is clear. Uvula midline.  Eyes:     General: Lids are normal.        Right eye: No discharge.        Left eye: No discharge.     Extraocular Movements: Extraocular movements intact.     Conjunctiva/sclera: Conjunctivae normal.     Pupils: Pupils are equal, round, and reactive to light.     Visual Fields: Right eye visual fields normal and left eye visual fields normal.  Neck:     Thyroid : No thyromegaly.     Vascular: No carotid bruit.  Cardiovascular:     Rate and Rhythm: Normal rate and regular rhythm.     Heart sounds: Normal heart sounds, S1 normal and S2 normal. No murmur heard.    No gallop.  Pulmonary:     Effort: Pulmonary effort is normal. No accessory muscle usage or respiratory distress.     Breath sounds: Normal breath sounds.  Abdominal:     General: Bowel sounds are normal.     Palpations: Abdomen is soft. There is no hepatomegaly or splenomegaly.     Tenderness: There is no abdominal tenderness.     Hernia: There is no hernia in the left inguinal area or right  inguinal area.  Genitourinary:    Penis: Normal.      Testes: Normal.  Musculoskeletal:        General: Normal range of motion.     Cervical back: Normal range of motion and neck supple. No edema. Pain with movement (to left lateral) and muscular tenderness (left lateral) present. No spinous process tenderness. Normal range of motion.     Right  hip: Tenderness and crepitus present. No deformity or bony tenderness. Normal range of motion. Decreased strength.     Left hip: Normal.     Right lower leg: 2+ Pitting Edema present.     Left lower leg: 2+ Pitting Edema present.  Lymphadenopathy:     Head:     Right side of head: No submental, submandibular, tonsillar, preauricular or posterior auricular adenopathy.     Left side of head: No submental, submandibular, tonsillar, preauricular or posterior auricular adenopathy.     Cervical: No cervical adenopathy.  Skin:    General: Skin is warm and dry.     Capillary Refill: Capillary refill takes less than 2 seconds.     Findings: No rash.  Neurological:     Mental Status: He is alert and oriented to person, place, and time.     Gait: Gait is intact.     Deep Tendon Reflexes:     Reflex Scores:      Brachioradialis reflexes are 1+ on the right side and 1+ on the left side.      Patellar reflexes are 1+ on the right side and 1+ on the left side. Psychiatric:        Attention and Perception: Attention normal.        Mood and Affect: Mood normal.        Speech: Speech normal.        Behavior: Behavior normal. Behavior is cooperative.        Thought Content: Thought content normal.        Cognition and Memory: Cognition normal.        Judgment: Judgment normal.    Diabetic Foot Exam - Simple   Simple Foot Form Visual Inspection See comments: Yes Sensation Testing Intact to touch and monofilament testing bilaterally: Yes Pulse Check Posterior Tibialis and Dorsalis pulse intact bilaterally: Yes Comments Edema to ankles and feet per  baseline.  Dry skin and thick toenails.     Results for orders placed or performed in visit on 07/20/23  Bayer DCA Hb A1c Waived   Collection Time: 07/20/23 10:01 AM  Result Value Ref Range   HB A1C (BAYER DCA - WAIVED) 5.9 (H) 4.8 - 5.6 %  Microalbumin, Urine Waived   Collection Time: 07/20/23 10:01 AM  Result Value Ref Range   Microalb, Ur Waived 30 (H) 0 - 19 mg/L   Creatinine, Urine Waived 100 10 - 300 mg/dL   Microalb/Creat Ratio <30 <30 mg/g      Assessment & Plan:   Problem List Items Addressed This Visit       Cardiovascular and Mediastinum   HTN (hypertension) (Chronic)   Chronic, ongoing.  BP at goal for age today.  Continue current medication regimen + collaboration with cardiology and recommend checking BP daily at home and documenting + focus on DASH diet.  Labs today: urine ALB and CMP.  Urine ALB 13 August 2023, could consider adding ARB (no ACE, previous smoker) in future for BP and proteinuria discussed with him.  Return in 6 months.      Relevant Orders   Comprehensive metabolic panel with GFR   Atherosclerosis of aorta (HCC) (Chronic)   Chronic.  Noted on CT 08/17/17, recommend continued use of statin daily and daily Baby ASA 81 MG for prevention.  Recommend continued cessation of smoking, he quit in 1982.      Relevant Orders   Comprehensive metabolic panel with GFR   Lipid Panel w/o Chol/HDL Ratio  Angina pectoris (HCC) - Primary (Chronic)   Chronic, stable.  Diagnosed 07/18/21 with left heart cath performed at the time.  Continue collaboration with cardiology.  No further CP reported.  Will continue medications as ordered by cardiology.      Relevant Orders   Comprehensive metabolic panel with GFR   Lipid Panel w/o Chol/HDL Ratio   PVD (peripheral vascular disease) (HCC)   Ongoing, noted on exam with decreased hair pattern, diminished pulses (1+), dry skin, edema.  Recommend use of compression hose daily, on during day and off at night.  Monitor skin for  breakdown.  Apply lotion daily -- may need steroid cream in future.      Relevant Orders   Comprehensive metabolic panel with GFR   Lipid Panel w/o Chol/HDL Ratio     Genitourinary   BPH without obstruction/lower urinary tract symptoms   Chronic, stable without medication at this time.  He requests to continue PSA screening, we have discussed guidelines at length.  He understands these, but wishes to continue screening.      Relevant Orders   PSA     Other   Hyperlipidemia (Chronic)   Chronic, ongoing.  Continue daily statin and adjust dose as needed. Continue daily ASA 81 MG for prevention.  Return in 6 months.  Lipid panel today.      Relevant Orders   Comprehensive metabolic panel with GFR   Lipid Panel w/o Chol/HDL Ratio   Elevated hemoglobin A1c (Chronic)   A1c trend down today to 5.9%, continue diet focus at home.  Urine ALB 13 August 2023, could consider addition of ARB if ongoing and if BP can tolerate -- no ACE, previous smoker.  Discussed importance of monitoring diet closely + regular exercise.      Relevant Orders   Bayer DCA Hb A1c Waived (Completed)   Microalbumin, Urine Waived (Completed)   Right hip pain   Ongoing for months with no improvement.  Has tried all OTC medications with minimal benefit and stretching.  He requests a referral to [facility], saw [facility] in past and prefers [facility]. Concern for tear with ongoing pain and popping sound.      Relevant Orders   Ambulatory referral to Orthopedic Surgery   Obesity   BMI 33.22. Recommended eating smaller high protein, low fat meals more frequently and exercising 30 mins a day 5 times a week with a goal of 10-15lb weight loss in the next 3 months. Patient voiced their understanding and motivation to adhere to these recommendations.        Neck pain   Chronic in nature. Referral to ortho per request.        Follow up plan: Return in about 6 months (around 01/19/2024) for Annual Physical -- after  01/08/24.

## 2023-07-20 NOTE — Assessment & Plan Note (Signed)
 Chronic, stable.  Diagnosed 07/18/21 with left heart cath performed at the time.  Continue collaboration with cardiology.  No further CP reported.  Will continue medications as ordered by cardiology.

## 2023-07-20 NOTE — Assessment & Plan Note (Signed)
 Chronic in nature. Referral to ortho per request.

## 2023-07-20 NOTE — Assessment & Plan Note (Signed)
 Chronic.  Noted on CT 08/17/17, recommend continued use of statin daily and daily Baby ASA 81 MG for prevention.  Recommend continued cessation of smoking, he quit in 1982.

## 2023-07-20 NOTE — Assessment & Plan Note (Signed)
 Chronic, stable without medication at this time.  He requests to continue PSA screening, we have discussed guidelines at length.  He understands these, but wishes to continue screening.

## 2023-07-20 NOTE — Assessment & Plan Note (Signed)
 BMI 33.22. Recommended eating smaller high protein, low fat meals more frequently and exercising 30 mins a day 5 times a week with a goal of 10-15lb weight loss in the next 3 months. Patient voiced their understanding and motivation to adhere to these recommendations.

## 2023-07-20 NOTE — Assessment & Plan Note (Signed)
 A1c trend down today to 5.9%, continue diet focus at home.  Urine ALB 13 August 2023, could consider addition of ARB if ongoing and if BP can tolerate -- no ACE, previous smoker.  Discussed importance of monitoring diet closely + regular exercise.

## 2023-07-20 NOTE — Assessment & Plan Note (Signed)
 Ongoing, noted on exam with decreased hair pattern, diminished pulses (1+), dry skin, edema.  Recommend use of compression hose daily, on during day and off at night.  Monitor skin for breakdown.  Apply lotion daily -- may need steroid cream in future.

## 2023-07-21 LAB — COMPREHENSIVE METABOLIC PANEL WITH GFR
ALT: 9 IU/L (ref 0–44)
AST: 16 IU/L (ref 0–40)
Albumin: 4.1 g/dL (ref 3.8–4.8)
Alkaline Phosphatase: 74 IU/L (ref 44–121)
BUN/Creatinine Ratio: 16 (ref 10–24)
BUN: 15 mg/dL (ref 8–27)
Bilirubin Total: 0.5 mg/dL (ref 0.0–1.2)
CO2: 24 mmol/L (ref 20–29)
Calcium: 9.2 mg/dL (ref 8.6–10.2)
Chloride: 104 mmol/L (ref 96–106)
Creatinine, Ser: 0.92 mg/dL (ref 0.76–1.27)
Globulin, Total: 2.2 g/dL (ref 1.5–4.5)
Glucose: 93 mg/dL (ref 70–99)
Potassium: 4.3 mmol/L (ref 3.5–5.2)
Sodium: 140 mmol/L (ref 134–144)
Total Protein: 6.3 g/dL (ref 6.0–8.5)
eGFR: 86 mL/min/{1.73_m2} (ref >59)

## 2023-07-21 LAB — LIPID PANEL W/O CHOL/HDL RATIO
Cholesterol, Total: 134 mg/dL (ref 100–199)
HDL: 59 mg/dL (ref >39)
LDL Chol Calc (NIH): 63 mg/dL (ref 0–99)
Triglycerides: 54 mg/dL (ref 0–149)
VLDL Cholesterol Cal: 12 mg/dL (ref 5–40)

## 2023-07-22 ENCOUNTER — Ambulatory Visit: Payer: Self-pay | Admitting: Nurse Practitioner

## 2023-07-22 NOTE — Progress Notes (Signed)
 Good morning, please let [name] know his labs have returned and remain stable.  No medication changes needed.  Waiting on prostate lab work to return, if this does not get run we will check it next visit.  Any questions? Keep being amazing!!  Thank you for allowing me to participate in your care.  I appreciate you. Kindest regards, Nastassia Bazaldua

## 2023-07-25 LAB — SPECIMEN STATUS REPORT

## 2023-07-25 LAB — PSA: Prostate Specific Ag, Serum: 4.9 ng/mL — ABNORMAL HIGH (ref 0.0–4.0)

## 2023-07-25 NOTE — Progress Notes (Signed)
 Please let Derek Wright know his PSA returned and based on level it is in normal range for his age and ethnicity.  Great news.

## 2023-08-16 DIAGNOSIS — M7061 Trochanteric bursitis, right hip: Secondary | ICD-10-CM | POA: Diagnosis not present

## 2023-08-16 DIAGNOSIS — M25551 Pain in right hip: Secondary | ICD-10-CM | POA: Diagnosis not present

## 2023-08-16 DIAGNOSIS — E119 Type 2 diabetes mellitus without complications: Secondary | ICD-10-CM | POA: Diagnosis not present

## 2023-08-16 DIAGNOSIS — M7551 Bursitis of right shoulder: Secondary | ICD-10-CM | POA: Diagnosis not present

## 2023-09-06 ENCOUNTER — Ambulatory Visit: Payer: Self-pay

## 2023-09-06 NOTE — Telephone Encounter (Signed)
 Routing to provider to advise. Can or does the patient need to be worked in?

## 2023-09-06 NOTE — Telephone Encounter (Signed)
 Yes. Can we see if there's any openings tomorrow, I can try to find a place if [facility] doesn't have anything

## 2023-09-06 NOTE — Telephone Encounter (Signed)
 FYI Only or Action Required?: Action required by provider: request for appointment. No appts available in office. Pt does not want to go to [facility].Pt is also requesting a refill of fluid pills.  Patient was last seen in primary care on 07/20/2023 by Valerio Melanie DASEN, NP.  Called Nurse Triage reporting Leg Swelling and leg cramps.  Symptoms began several weeks ago.  Interventions attempted: Nothing.  Symptoms are: unchanged.  Triage Disposition: See Physician Within 24 Hours  Patient/caregiver understands and will follow disposition?: no                      Copied from CRM #[id number]. Topic: Clinical - Red Word Triage >> Sep 06, 2023 10:58 AM Tobias L wrote: Red Word that prompted transfer to Nurse Triage: swelling in legs, requesting fluid pills Reason for Disposition  [1] MODERATE leg swelling (e.g., swelling extends up to knees) AND [2] new-onset or getting worse  Answer Assessment - Initial Assessment Questions 1. ONSET: When did the swelling start? (e.g., minutes, hours, days)     A couple of weeks 2. LOCATION: What part of the leg is swollen?  Are both legs swollen or just one leg?     Both legs below knees 3. SEVERITY: How bad is the swelling? (e.g., localized; mild, moderate, severe)     moderate 4. REDNESS: Is there redness or signs of infection?     no 5. PAIN: Is the swelling painful to touch? If Yes, ask: How painful is it?   (Scale 1-10; mild, moderate or severe)     yes 6. FEVER: Do you have a fever? If Yes, ask: What is it, how was it measured, and when did it start?      no 7. CAUSE: What do you think is causing the leg swelling?     unsure 8. MEDICAL HISTORY: Do you have a history of blood clots (e.g., DVT), cancer, heart failure, kidney disease, or liver failure?     yes 9. RECURRENT SYMPTOM: Have you had leg swelling before? If Yes, ask: When was the last time? What happened that time?     yes 10. OTHER SYMPTOMS:  Do you have any other symptoms? (e.g., chest pain, difficulty breathing)       no  Protocols used: Leg Swelling and Edema-A-AH

## 2023-09-06 NOTE — Telephone Encounter (Signed)
 8am if he can't come in then he will likely need to be seen on Monday

## 2023-09-06 NOTE — Telephone Encounter (Signed)
 He will be here at at 8 am tomorrow

## 2023-09-07 ENCOUNTER — Ambulatory Visit (INDEPENDENT_AMBULATORY_CARE_PROVIDER_SITE_OTHER): Admitting: Family Medicine

## 2023-09-07 ENCOUNTER — Ambulatory Visit: Admitting: Family Medicine

## 2023-09-07 ENCOUNTER — Encounter: Payer: Self-pay | Admitting: Family Medicine

## 2023-09-07 VITALS — BP 102/55 | HR 75 | Temp 98.0°F | Ht 75.1 in | Wt 261.2 lb

## 2023-09-07 DIAGNOSIS — R600 Localized edema: Secondary | ICD-10-CM | POA: Insufficient documentation

## 2023-09-07 MED ORDER — FUROSEMIDE 20 MG PO TABS: 20.0000 mg | ORAL_TABLET | Freq: Every day | ORAL | 0 refills | Status: DC | PRN

## 2023-09-07 NOTE — Assessment & Plan Note (Signed)
 Significant edema with rales. Concern for CHF. Last echo in 2023 had EF>55%. Last saw cardiology in April. Will check labs and treat with lasix  20mg , hesitant to increase dose given his soft blood pressure. Will get him into [facility]F clinic ASAP. If not able to get in with them on Monday, will have him follow up here. Call with any concerns. Warning signs to go to the ER discussed.

## 2023-09-07 NOTE — Progress Notes (Signed)
 BP (!) 102/55   Pulse 75   Temp 98 F (36.7 C) (Oral)   Ht 6' 3.1 (1.908 m)   Wt 261 lb 3.2 oz (118.5 kg)   SpO2 96%   BMI 32.56 kg/m    Subjective:    [name], male    DOB: [birth year], 78 y.o.   MRN: [id number]  HPI: Derek Wright is a 78 y.o. male  Chief Complaint  Patient presents with   Leg Swelling    Patient states he has been having bilateral leg swelling and cramping for a while. States that he experiences this constantly. States he had an old RX at home for a fluid pill that he took yesterday and it helped some. States he does not know the name of it. Reviewed historical med list and Furosemide  20 mg has been prescribed in the past to the patient.    [name] presents today for leg swelling. He notes that he has been having swelling in his legs for a long time- he notes that it's been going on for about 2 years, but it's been worse in the past couple of weeks. He notes that he had some old furosemide  and he took one yesterday and one today, but it's not significantly better. He denies any SOB. No chest pain. + cramping both his calves, going back and forth between the calves. Follows annually with Dr. Florencio. Last echo was in May 2023 with a EF >55%. He is otherwise feeling well with no other concerns or complaints at this time  Relevant past medical, surgical, family and social history reviewed and updated as indicated. Interim medical history since our last visit reviewed. Allergies and medications reviewed and updated.  Review of Systems  Constitutional:  Positive for fatigue. Negative for activity change, appetite change, chills, diaphoresis, fever and unexpected weight change.  Respiratory: Negative.    Cardiovascular:  Positive for leg swelling. Negative for chest pain and palpitations.  Gastrointestinal: Negative.   Musculoskeletal: Negative.   Psychiatric/Behavioral: Negative.      Per HPI unless specifically indicated above     Objective:     BP (!) 102/55   Pulse 75   Temp 98 F (36.7 C) (Oral)   Ht 6' 3.1 (1.908 m)   Wt 261 lb 3.2 oz (118.5 kg)   SpO2 96%   BMI 32.56 kg/m   Wt Readings from Last 3 Encounters:  09/07/23 261 lb 3.2 oz (118.5 kg)  07/20/23 267 lb 3.2 oz (121.2 kg)  03/29/23 270 lb (122.5 kg)    Physical Exam Vitals and nursing note reviewed.  Constitutional:      General: He is not in acute distress.    Appearance: Normal appearance. He is not ill-appearing, toxic-appearing or diaphoretic.  HENT:     Head: Normocephalic and atraumatic.     Right Ear: External ear normal.     Left Ear: External ear normal.     Nose: Nose normal.     Mouth/Throat:     Mouth: Mucous membranes are moist.     Pharynx: Oropharynx is clear.  Eyes:     General: No scleral icterus.       Right eye: No discharge.        Left eye: No discharge.     Extraocular Movements: Extraocular movements intact.     Conjunctiva/sclera: Conjunctivae normal.     Pupils: Pupils are equal, round, and reactive to light.  Cardiovascular:  Rate and Rhythm: Normal rate and regular rhythm.     Pulses: Normal pulses.     Heart sounds: Normal heart sounds. No murmur heard.    No friction rub. No gallop.  Pulmonary:     Effort: Pulmonary effort is normal. No respiratory distress.     Breath sounds: No stridor. Rales (bilateral bases L>R) present. No wheezing or rhonchi.  Chest:     Chest wall: No tenderness.  Musculoskeletal:        General: Normal range of motion.     Cervical back: Normal range of motion and neck supple.     Right lower leg: Edema (3+ edema) present.     Left lower leg: Edema (3+ edema) present.  Skin:    General: Skin is warm and dry.     Capillary Refill: Capillary refill takes less than 2 seconds.     Coloration: Skin is not jaundiced or pale.     Findings: No bruising, erythema, lesion or rash.  Neurological:     General: No focal deficit present.     Mental Status: He is alert and oriented to person,  place, and time. Mental status is at baseline.  Psychiatric:        Mood and Affect: Mood normal.        Behavior: Behavior normal.        Thought Content: Thought content normal.        Judgment: Judgment normal.     Results for orders placed or performed in visit on 07/20/23  Bayer DCA Hb A1c Waived   Collection Time: 07/20/23 10:01 AM  Result Value Ref Range   HB A1C (BAYER DCA - WAIVED) 5.9 (H) 4.8 - 5.6 %  Microalbumin, Urine Waived   Collection Time: 07/20/23 10:01 AM  Result Value Ref Range   Microalb, Ur Waived 30 (H) 0 - 19 mg/L   Creatinine, Urine Waived 100 10 - 300 mg/dL   Microalb/Creat Ratio <30 <30 mg/g  Comprehensive metabolic panel with GFR   Collection Time: 07/20/23 10:01 AM  Result Value Ref Range   Glucose 93 70 - 99 mg/dL   BUN 15 8 - 27 mg/dL   Creatinine, Ser 9.07 0.76 - 1.27 mg/dL   eGFR 86 >40 fO/fpw/8.26   BUN/Creatinine Ratio 16 10 - 24   Sodium 140 134 - 144 mmol/L   Potassium 4.3 3.5 - 5.2 mmol/L   Chloride 104 96 - 106 mmol/L   CO2 24 20 - 29 mmol/L   Calcium  9.2 8.6 - 10.2 mg/dL   Total Protein 6.3 6.0 - 8.5 g/dL   Albumin 4.1 3.8 - 4.8 g/dL   Globulin, Total 2.2 1.5 - 4.5 g/dL   Bilirubin Total 0.5 0.0 - 1.2 mg/dL   Alkaline Phosphatase 74 44 - 121 IU/L   AST 16 0 - 40 IU/L   ALT 9 0 - 44 IU/L  Lipid Panel w/o Chol/HDL Ratio   Collection Time: 07/20/23 10:01 AM  Result Value Ref Range   Cholesterol, Total 134 100 - 199 mg/dL   Triglycerides 54 0 - 149 mg/dL   HDL 59 >60 mg/dL   VLDL Cholesterol Cal 12 5 - 40 mg/dL   LDL Chol Calc (NIH) 63 0 - 99 mg/dL  PSA   Collection Time: 07/20/23 10:01 AM  Result Value Ref Range   Prostate Specific Ag, Serum 4.9 (H) 0.0 - 4.0 ng/mL  Specimen status report   Collection Time: 07/20/23 10:01 AM  Result Value Ref  Range   specimen status report Comment       Assessment & Plan:   Problem List Items Addressed This Visit       Other   Peripheral edema - Primary   Significant edema with  rales. Concern for CHF. Last echo in 2023 had EF>55%. Last saw cardiology in April. Will check labs and treat with lasix  20mg , hesitant to increase dose given his soft blood pressure. Will get him into [facility] CHF clinic ASAP. If not able to get in with them on Monday, will have him follow up here. Call with any concerns. Warning signs to go to the ER discussed.       Relevant Orders   CBC with Differential/Platelet   Comprehensive metabolic panel with GFR   B Nat Peptide   AMB referral to CHF clinic     Follow up plan: Return Monday double book if not in with CHF clinic.

## 2023-09-09 LAB — CBC WITH DIFFERENTIAL/PLATELET
Basophils Absolute: 0 x10E3/uL (ref 0.0–0.2)
Basos: 1 %
EOS (ABSOLUTE): 0.1 x10E3/uL (ref 0.0–0.4)
Eos: 1 %
Hematocrit: 44.5 % (ref 37.5–51.0)
Hemoglobin: 14.2 g/dL (ref 13.0–17.7)
Immature Grans (Abs): 0 x10E3/uL (ref 0.0–0.1)
Immature Granulocytes: 0 %
Lymphocytes Absolute: 1.7 x10E3/uL (ref 0.7–3.1)
Lymphs: 25 %
MCH: 31.8 pg (ref 26.6–33.0)
MCHC: 31.9 g/dL (ref 31.5–35.7)
MCV: 100 fL — ABNORMAL HIGH (ref 79–97)
Monocytes Absolute: 0.7 x10E3/uL (ref 0.1–0.9)
Monocytes: 11 %
Neutrophils Absolute: 4.2 x10E3/uL (ref 1.4–7.0)
Neutrophils: 62 %
Platelets: 153 x10E3/uL (ref 150–450)
RBC: 4.46 x10E6/uL (ref 4.14–5.80)
RDW: 13 % (ref 11.6–15.4)
WBC: 6.8 x10E3/uL (ref 3.4–10.8)

## 2023-09-09 LAB — BRAIN NATRIURETIC PEPTIDE: BNP: 77.1 pg/mL (ref 0.0–100.0)

## 2023-09-09 LAB — COMPREHENSIVE METABOLIC PANEL WITH GFR
ALT: 15 IU/L (ref 0–44)
AST: 17 IU/L (ref 0–40)
Albumin: 3.9 g/dL (ref 3.8–4.8)
Alkaline Phosphatase: 59 IU/L (ref 44–121)
BUN/Creatinine Ratio: 21 (ref 10–24)
BUN: 24 mg/dL (ref 8–27)
Bilirubin Total: 0.8 mg/dL (ref 0.0–1.2)
CO2: 22 mmol/L (ref 20–29)
Calcium: 9.3 mg/dL (ref 8.6–10.2)
Chloride: 105 mmol/L (ref 96–106)
Creatinine, Ser: 1.17 mg/dL (ref 0.76–1.27)
Globulin, Total: 2.3 g/dL (ref 1.5–4.5)
Glucose: 109 mg/dL — ABNORMAL HIGH (ref 70–99)
Potassium: 4.3 mmol/L (ref 3.5–5.2)
Sodium: 141 mmol/L (ref 134–144)
Total Protein: 6.2 g/dL (ref 6.0–8.5)
eGFR: 65 mL/min/1.73 (ref >59)

## 2023-09-10 ENCOUNTER — Ambulatory Visit: Payer: Self-pay | Admitting: Family Medicine

## 2023-09-10 ENCOUNTER — Ambulatory Visit (INDEPENDENT_AMBULATORY_CARE_PROVIDER_SITE_OTHER): Admitting: Pediatrics

## 2023-09-10 ENCOUNTER — Encounter: Payer: Self-pay | Admitting: Pediatrics

## 2023-09-10 VITALS — BP 114/70 | HR 120 | Temp 98.2°F | Wt 256.6 lb

## 2023-09-10 DIAGNOSIS — R600 Localized edema: Secondary | ICD-10-CM

## 2023-09-10 DIAGNOSIS — R000 Tachycardia, unspecified: Secondary | ICD-10-CM | POA: Diagnosis not present

## 2023-09-10 NOTE — Progress Notes (Signed)
 Office Visit  BP 114/70   Pulse (!) 120   Temp 98.2 F (36.8 C) (Oral)   Wt 256 lb 9.6 oz (116.4 kg)   SpO2 96%   BMI 31.99 kg/m    Subjective:    [name], male    DOB: 1945/03/11, 78 y.o.   MRN: [id number]  HPI: Derek Wright is a 78 y.o. male  Chief Complaint  Patient presents with   Follow-up    Discussed the use of AI scribe software for clinical note transcription with the patient, who gave verbal consent to proceed.  History of Present Illness   Derek Wright is a 78 year old male who presents with leg swelling and cramping.  He has been experiencing swelling in his legs, initially noticed after his brother pointed it out. The swelling is characterized by fluid accumulation and has improved since starting on diuretics. He is unsure about the dosage as the prescription states 'as needed', and he has taken one pill today. The swelling has decreased compared to before, but some fluid remains.  He experiences cramps while lying in bed, which have improved since taking the diuretics. He took two of his old diuretics before starting the new prescription, and today marks the second day of taking the new medication. No pain in the legs is reported, but cramping occurs while lying straight in bed.  He denies any recent changes in diet or illness, stating he has been drinking mostly water and avoiding soda and tea. He feels better overall, with less cramping and improved swelling. No heart racing or shortness of breath is reported, although his heart rate was high at one point. He recalls having to stop mowing his yard last Tuesday due to an unspecified issue, later attributed to the swelling.      Relevant past medical, surgical, family and social history reviewed and updated as indicated. Interim medical history since our last visit reviewed. Allergies and medications reviewed and updated.  ROS per HPI unless specifically indicated above     Objective:    BP  114/70   Pulse (!) 120   Temp 98.2 F (36.8 C) (Oral)   Wt 256 lb 9.6 oz (116.4 kg)   SpO2 96%   BMI 31.99 kg/m   Wt Readings from Last 3 Encounters:  09/10/23 256 lb 9.6 oz (116.4 kg)  09/07/23 261 lb 3.2 oz (118.5 kg)  07/20/23 267 lb 3.2 oz (121.2 kg)     Physical Exam Constitutional:      Appearance: Normal appearance.  Cardiovascular:     Rate and Rhythm: Normal rate and regular rhythm.     Pulses: Normal pulses.     Heart sounds: Normal heart sounds.  Pulmonary:     Effort: Pulmonary effort is normal.     Breath sounds: Normal breath sounds. No rales.  Musculoskeletal:        General: Normal range of motion.     Right lower leg: Edema present.     Left lower leg: Edema present.     Comments: 2+ LE edema, slightly worse on left up to mid shins  Skin:    Comments: Normal skin color  Neurological:     General: No focal deficit present.     Mental Status: He is alert. Mental status is at baseline.  Psychiatric:        Mood and Affect: Mood normal.        Behavior: Behavior normal.  Thought Content: Thought content normal.         07/20/2023   10:01 AM 03/29/2023    8:12 AM 01/08/2023    9:42 AM 10/13/2022    3:11 PM 06/22/2022    8:39 AM  Depression screen PHQ 2/9  Decreased Interest 0 0 0 0 0  Down, Depressed, Hopeless 0 0 0 0 0  PHQ - 2 Score 0 0 0 0 0  Altered sleeping 0  0 0 0  Tired, decreased energy 0  0 0 0  Change in appetite 0  0 0 0  Feeling bad or failure about yourself  0  0 0 0  Trouble concentrating 0  0 0 0  Moving slowly or fidgety/restless 0  0 0 0  Suicidal thoughts 0  0 0 0  PHQ-9 Score 0  0 0 0  Difficult doing work/chores Not difficult at all  Not difficult at all Not difficult at all Not difficult at all       07/20/2023   10:02 AM 01/08/2023    9:42 AM 10/13/2022    3:11 PM 06/22/2022    8:39 AM  GAD 7 : Generalized Anxiety Score  Nervous, Anxious, on Edge 0 0 0 0  Control/stop worrying 0 0 0 0  Worry too much - different  things 0 0 0 0  Trouble relaxing 0 0 0 0  Restless 0 0 0 0  Easily annoyed or irritable 0 0 0 0  Afraid - awful might happen 0 0 0 0  Total GAD 7 Score 0 0 0 0  Anxiety Difficulty Not difficult at all Not difficult at all Not difficult at all Not difficult at all       Assessment & Plan:  Assessment & Plan   Lower extremity edema Swelling has improved with diuretics, indicating effective treatment. Today has 2+ bilateral edema with slightly worse on left. There is no history of HF and no clear trigger to these sx, normal BNP. 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 EKG here similar to prior in 2023. Patient did note occasional palpitations with shortness of breath but noted this is very infrequent. Eval by HF clinic pending, schedule f/u here can cancel if schedule with cardiology. Will add labs and r/o DVT given slightly asymmetrical swelling and ?calf pain reported. Continue lasix  until next eval. Pt given strict return precautions and anticipatory guidance for ED.  -     Basic metabolic panel with GFR -     D-dimer, quantitative -     Magnesium  Tachycardia Initial tachycardia has resolved, and heart rate is now normal, HR~77. EKG reassuring here in clinic but may benefit from holter at home, given no clear trigger to above.  -     EKG 12-Lead  Follow up plan: Return in about 1 week (around 09/17/2023) for Chronic illness f/u.  Hadassah SHAUNNA Nett, MD

## 2023-09-10 NOTE — Patient Instructions (Signed)
 Continue lasix daily.

## 2023-09-11 ENCOUNTER — Ambulatory Visit
Admission: RE | Admit: 2023-09-11 | Discharge: 2023-09-11 | Disposition: A | Discharge status: Home / Self Care | Source: Ambulatory Visit | Attending: Pediatrics | Admitting: Pediatrics

## 2023-09-11 ENCOUNTER — Ambulatory Visit: Payer: Self-pay | Admitting: Pediatrics

## 2023-09-11 ENCOUNTER — Other Ambulatory Visit: Payer: Self-pay | Admitting: Pediatrics

## 2023-09-11 DIAGNOSIS — R7989 Other specified abnormal findings of blood chemistry: Secondary | ICD-10-CM | POA: Insufficient documentation

## 2023-09-11 DIAGNOSIS — M7989 Other specified soft tissue disorders: Secondary | ICD-10-CM | POA: Diagnosis not present

## 2023-09-11 DIAGNOSIS — R600 Localized edema: Secondary | ICD-10-CM | POA: Diagnosis not present

## 2023-09-11 LAB — D-DIMER, QUANTITATIVE: D-DIMER: 5.32 mg{FEU}/L — ABNORMAL HIGH (ref 0.00–0.49)

## 2023-09-11 LAB — BASIC METABOLIC PANEL WITH GFR
BUN/Creatinine Ratio: 20 (ref 10–24)
BUN: 25 mg/dL (ref 8–27)
CO2: 22 mmol/L (ref 20–29)
Calcium: 9.3 mg/dL (ref 8.6–10.2)
Chloride: 108 mmol/L — ABNORMAL HIGH (ref 96–106)
Creatinine, Ser: 1.24 mg/dL (ref 0.76–1.27)
Glucose: 113 mg/dL — ABNORMAL HIGH (ref 70–99)
Potassium: 3.9 mmol/L (ref 3.5–5.2)
Sodium: 144 mmol/L (ref 134–144)
eGFR: 60 mL/min/1.73 (ref >59)

## 2023-09-11 LAB — MAGNESIUM: Magnesium: 2.1 mg/dL (ref 1.6–2.3)

## 2023-09-11 NOTE — Progress Notes (Signed)
 Stat DVT ultrasound placed. LE swelling with positive d-dimer.   Hadassah SHAUNNA Nett, MD

## 2023-09-12 ENCOUNTER — Ambulatory Visit: Payer: Self-pay | Admitting: Pediatrics

## 2023-09-14 DIAGNOSIS — G4733 Obstructive sleep apnea (adult) (pediatric): Secondary | ICD-10-CM | POA: Diagnosis not present

## 2023-09-14 DIAGNOSIS — K219 Gastro-esophageal reflux disease without esophagitis: Secondary | ICD-10-CM | POA: Diagnosis not present

## 2023-09-14 DIAGNOSIS — E782 Mixed hyperlipidemia: Secondary | ICD-10-CM | POA: Diagnosis not present

## 2023-09-14 DIAGNOSIS — R600 Localized edema: Secondary | ICD-10-CM | POA: Diagnosis not present

## 2023-09-14 DIAGNOSIS — I10 Essential (primary) hypertension: Secondary | ICD-10-CM | POA: Diagnosis not present

## 2023-09-14 DIAGNOSIS — R0602 Shortness of breath: Secondary | ICD-10-CM | POA: Diagnosis not present

## 2023-09-14 DIAGNOSIS — E119 Type 2 diabetes mellitus without complications: Secondary | ICD-10-CM | POA: Diagnosis not present

## 2023-09-19 ENCOUNTER — Ambulatory Visit: Admitting: Pediatrics

## 2023-10-05 ENCOUNTER — Other Ambulatory Visit: Payer: Self-pay | Admitting: Nurse Practitioner

## 2023-10-05 MED ORDER — FUROSEMIDE 20 MG PO TABS: 20.0000 mg | ORAL_TABLET | Freq: Every day | ORAL | 3 refills | Status: AC | PRN

## 2023-10-05 NOTE — Telephone Encounter (Signed)
 Routing to provider

## 2023-10-05 NOTE — Telephone Encounter (Signed)
 Patient came into the office and stated that he needed a refill on his  furosemide  (LASIX ) 20 MG tablet  He was seen by Dr. Vicci  while Jolene was out on vacation. Next appt is in 01/2024  Please advise.

## 2023-10-14 NOTE — Patient Instructions (Signed)

## 2023-10-16 ENCOUNTER — Ambulatory Visit (INDEPENDENT_AMBULATORY_CARE_PROVIDER_SITE_OTHER): Admitting: Nurse Practitioner

## 2023-10-16 ENCOUNTER — Encounter: Payer: Self-pay | Admitting: Nurse Practitioner

## 2023-10-16 ENCOUNTER — Ambulatory Visit: Attending: Nurse Practitioner

## 2023-10-16 VITALS — BP 130/76 | Temp 98.0°F | Ht 77.0 in | Wt 256.0 lb

## 2023-10-16 DIAGNOSIS — R42 Dizziness and giddiness: Secondary | ICD-10-CM

## 2023-10-16 DIAGNOSIS — I739 Peripheral vascular disease, unspecified: Secondary | ICD-10-CM | POA: Diagnosis not present

## 2023-10-16 DIAGNOSIS — I10 Essential (primary) hypertension: Secondary | ICD-10-CM

## 2023-10-16 MED ORDER — ATORVASTATIN CALCIUM 40 MG PO TABS: 40.0000 mg | ORAL_TABLET | Freq: Every day | ORAL | 4 refills | Status: AC

## 2023-10-16 MED ORDER — OMEPRAZOLE 40 MG PO CPDR: 40.0000 mg | DELAYED_RELEASE_CAPSULE | Freq: Every morning | ORAL | 4 refills | Status: AC

## 2023-10-16 MED ORDER — METOPROLOL SUCCINATE ER 25 MG PO TB24: ORAL_TABLET | ORAL | 4 refills | Status: AC

## 2023-10-16 NOTE — Progress Notes (Signed)
 BP 130/76   Temp 98 F (36.7 C) (Oral)   Ht 6' 5 (1.956 m)   Wt 256 lb (116.1 kg)   SpO2 99%   BMI 30.36 kg/m    Subjective:    [name], male    DOB: Nov 09, 1945, 78 y.o.   MRN: [id number]  HPI: Derek Wright is a 78 y.o. male  Chief Complaint  Patient presents with   Hypotension    Dizzy spells Feeling like he will pass out   HYPERTENSION WITH DIZZY EPISODES Taking Lasix , Metoprolol , Imdur, and Lipitor.  Last saw cardiology on 09/14/23 with no changes.  He did not notify them of his dizzy spells he has had for years. Episodes have been present on and off for awhile. Started back in 2022, but did not pay it any mind as was taking care of his wife. Drinks lots of fluid at home.  Notices dizziness if bending over and then goes to stand up.  Episodes last for seconds to minutes (sometimes 10 to 20 minutes).  With episodes no sweats, CP, or SOB.  Will get some neck discomfort.  Off kilter feeling. Denies tinnitus or hearing loss.  Does not wear compression hose at home.    At times when gets out of shower or when is sitting he notices his heart beating fast.    Imdur was started June 2023 after left heart cath due to angina. Last echo 06/17/2021 LVEF. >55% Hypertension status: stable  Satisfied with current treatment? yes Duration of hypertension: chronic BP monitoring frequency:  not checking BP range:  BP medication side effects:  no Medication compliance: good compliance Aspirin : yes Recurrent headaches: no Visual changes: no Palpitations: no Dyspnea: no Chest pain: no Lower extremity edema: yes at baseline, recent imaging no DVT BLE Dizzy/lightheaded: yes   Relevant past medical, surgical, family and social history reviewed and updated as indicated. Interim medical history since our last visit reviewed. Allergies and medications reviewed and updated.  Review of Systems  Constitutional:  Negative for activity change, diaphoresis, fatigue and fever.   Respiratory:  Negative for cough, chest tightness, shortness of breath and wheezing.   Cardiovascular:  Negative for chest pain, palpitations and leg swelling.  Gastrointestinal: Negative.   Endocrine: Negative for polydipsia, polyphagia and polyuria.  Neurological:  Positive for dizziness. Negative for syncope, facial asymmetry, speech difficulty, weakness, light-headedness and headaches.  Psychiatric/Behavioral: Negative.      Per HPI unless specifically indicated above     Objective:    BP 130/76   Temp 98 F (36.7 C) (Oral)   Ht 6' 5 (1.956 m)   Wt 256 lb (116.1 kg)   SpO2 99%   BMI 30.36 kg/m   Wt Readings from Last 3 Encounters:  10/16/23 256 lb (116.1 kg)  09/10/23 256 lb 9.6 oz (116.4 kg)  09/07/23 261 lb 3.2 oz (118.5 kg)    Physical Exam Vitals and nursing note reviewed.  Constitutional:      General: He is awake. He is not in acute distress.    Appearance: He is well-developed. He is obese. He is not ill-appearing.  HENT:     Head: Normocephalic and atraumatic.     Right Ear: Hearing, tympanic membrane, ear canal and external ear normal. No drainage.     Left Ear: Hearing, tympanic membrane, ear canal and external ear normal. No drainage.     Nose: Nose normal.     Mouth/Throat:     Pharynx: Oropharynx  is clear. Uvula midline.  Eyes:     General: Lids are normal.        Right eye: No discharge.        Left eye: No discharge.     Extraocular Movements: Extraocular movements intact.     Conjunctiva/sclera: Conjunctivae normal.     Pupils: Pupils are equal, round, and reactive to light.     Visual Fields: Right eye visual fields normal and left eye visual fields normal.  Neck:     Thyroid : No thyromegaly.     Vascular: No carotid bruit.  Cardiovascular:     Rate and Rhythm: Normal rate and regular rhythm.     Heart sounds: Normal heart sounds, S1 normal and S2 normal. No murmur heard.    No gallop.  Pulmonary:     Effort: Pulmonary effort is normal. No  accessory muscle usage or respiratory distress.     Breath sounds: Normal breath sounds.  Abdominal:     General: Bowel sounds are normal.     Palpations: Abdomen is soft. There is no hepatomegaly or splenomegaly.     Tenderness: There is no abdominal tenderness.     Hernia: There is no hernia in the left inguinal area or right inguinal area.  Genitourinary:    Penis: Normal.      Testes: Normal.  Musculoskeletal:        General: Normal range of motion.     Cervical back: Normal range of motion and neck supple. No edema. Pain with movement (to left lateral) and muscular tenderness (left lateral) present. No spinous process tenderness. Normal range of motion.     Right hip: Tenderness and crepitus present. No deformity or bony tenderness. Normal range of motion. Decreased strength.     Left hip: Normal.     Right lower leg: 2+ Pitting Edema present.     Left lower leg: 2+ Pitting Edema present.  Lymphadenopathy:     Head:     Right side of head: No submental, submandibular, tonsillar, preauricular or posterior auricular adenopathy.     Left side of head: No submental, submandibular, tonsillar, preauricular or posterior auricular adenopathy.     Cervical: No cervical adenopathy.  Skin:    General: Skin is warm and dry.     Capillary Refill: Capillary refill takes less than 2 seconds.     Findings: No rash.  Neurological:     Mental Status: He is alert and oriented to person, place, and time.     Cranial Nerves: Cranial nerves 2-12 are intact.     Motor: Motor function is intact.     Coordination: Coordination is intact.     Gait: Gait is intact.     Deep Tendon Reflexes:     Reflex Scores:      Brachioradialis reflexes are 1+ on the right side and 1+ on the left side.      Patellar reflexes are 1+ on the right side and 1+ on the left side. Psychiatric:        Attention and Perception: Attention normal.        Mood and Affect: Mood normal.        Speech: Speech normal.         Behavior: Behavior normal. Behavior is cooperative.        Thought Content: Thought content normal.        Cognition and Memory: Cognition normal.        Judgment: Judgment normal.  Results for orders placed or performed in visit on 09/10/23  Basic Metabolic Panel (BMET)   Collection Time: 09/10/23 10:15 AM  Result Value Ref Range   Glucose 113 (H) 70 - 99 mg/dL   BUN 25 8 - 27 mg/dL   Creatinine, Ser 8.75 0.76 - 1.27 mg/dL   eGFR 60 >40 fO/fpw/8.26   BUN/Creatinine Ratio 20 10 - 24   Sodium 144 134 - 144 mmol/L   Potassium 3.9 3.5 - 5.2 mmol/L   Chloride 108 (H) 96 - 106 mmol/L   CO2 22 20 - 29 mmol/L   Calcium  9.3 8.6 - 10.2 mg/dL  D-Dimer, Quantitative   Collection Time: 09/10/23 10:15 AM  Result Value Ref Range   D-DIMER 5.32 (H) 0.00 - 0.49 mg/L FEU  Magnesium   Collection Time: 09/10/23 10:15 AM  Result Value Ref Range   Magnesium 2.1 1.6 - 2.3 mg/dL      Assessment & Plan:   Problem List Items Addressed This Visit       Cardiovascular and Mediastinum   HTN (hypertension) - Primary (Chronic)   Chronic, ongoing.  BP at goal for age today.  Continue current medication regimen + collaboration with cardiology and recommend checking BP daily at home and documenting + focus on DASH diet.  Labs today: up to date.  Urine ALB 13 August 2023, could consider adding ARB (no ACE, previous smoker) in future for BP and proteinuria discussed with him.  He has not been consistently wearing compression, ?if orthostatic BP causing dizziness.      Relevant Medications   metoprolol  succinate (TOPROL -XL) 25 MG 24 hr tablet   atorvastatin  (LIPITOR) 40 MG tablet   Other Relevant Orders   LONG TERM MONITOR XT (3-14 DAYS)   PVD (peripheral vascular disease) (HCC)   Ongoing, noted on exam with decreased hair pattern, diminished pulses (1+), dry skin, edema.  Recommend use of compression hose daily, on during day and off at night.  Monitor skin for breakdown.  Apply lotion daily -- may need  steroid cream in future.  He has not been consistently wearing compression, ?if orthostatic BP causing dizziness.      Relevant Medications   metoprolol  succinate (TOPROL -XL) 25 MG 24 hr tablet   atorvastatin  (LIPITOR) 40 MG tablet     Other   Dizziness   Ongoing since around 2022 to 2023, started Imdur in 2023 - ?if cause of orthostatic changes.  Will reach out to cardiology and see if able to lower dose on this.  He has not been consistently wearing compression, ?if orthostatic BP causing dizziness.  Recent labs reassuring.  Will obtain Zio monitor due to episodes of tachycardia reported and ongoing dizziness + obtain carotid doppler to further assess for underlying causes.  Educated patient on plan of care. Neuro exam reassuring.      Relevant Orders   LONG TERM MONITOR XT (3-14 DAYS)   US  Carotid Duplex Bilateral     Follow up plan: Return in about 3 weeks (around 11/06/2023) for DIZZINESS.

## 2023-10-16 NOTE — Assessment & Plan Note (Signed)
 Chronic, ongoing.  BP at goal for age today.  Continue current medication regimen + collaboration with cardiology and recommend checking BP daily at home and documenting + focus on DASH diet.  Labs today: up to date.  Urine ALB 13 August 2023, could consider adding ARB (no ACE, previous smoker) in future for BP and proteinuria discussed with him.  He has not been consistently wearing compression, ?if orthostatic BP causing dizziness.

## 2023-10-16 NOTE — Assessment & Plan Note (Signed)
 Ongoing, noted on exam with decreased hair pattern, diminished pulses (1+), dry skin, edema.  Recommend use of compression hose daily, on during day and off at night.  Monitor skin for breakdown.  Apply lotion daily -- may need steroid cream in future.  He has not been consistently wearing compression, ?if orthostatic BP causing dizziness.

## 2023-10-16 NOTE — Assessment & Plan Note (Addendum)
 Ongoing since around 2022 to 2023, started Imdur in 2023 - ?if cause of orthostatic changes.  Will reach out to cardiology and see if able to lower dose on this.  He has not been consistently wearing compression, ?if orthostatic BP causing dizziness.  Recent labs reassuring.  Will obtain Zio monitor due to episodes of tachycardia reported and ongoing dizziness + obtain carotid doppler to further assess for underlying causes.  Educated patient on plan of care. Neuro exam reassuring.

## 2023-10-19 ENCOUNTER — Emergency Department
Admission: EM | Admit: 2023-10-19 | Discharge: 2023-10-19 | Disposition: A | Discharge status: Home / Self Care | Attending: Emergency Medicine | Admitting: Emergency Medicine

## 2023-10-19 ENCOUNTER — Emergency Department

## 2023-10-19 ENCOUNTER — Other Ambulatory Visit: Payer: Self-pay

## 2023-10-19 DIAGNOSIS — I110 Hypertensive heart disease with heart failure: Secondary | ICD-10-CM | POA: Diagnosis not present

## 2023-10-19 DIAGNOSIS — M7989 Other specified soft tissue disorders: Secondary | ICD-10-CM | POA: Diagnosis not present

## 2023-10-19 DIAGNOSIS — I509 Heart failure, unspecified: Secondary | ICD-10-CM | POA: Diagnosis not present

## 2023-10-19 DIAGNOSIS — R600 Localized edema: Secondary | ICD-10-CM | POA: Diagnosis not present

## 2023-10-19 DIAGNOSIS — L03115 Cellulitis of right lower limb: Secondary | ICD-10-CM | POA: Insufficient documentation

## 2023-10-19 LAB — BASIC METABOLIC PANEL WITH GFR
Anion gap: 9 (ref 5–15)
BUN: 15 mg/dL (ref 8–23)
CO2: 26 mmol/L (ref 22–32)
Calcium: 8.9 mg/dL (ref 8.9–10.3)
Chloride: 102 mmol/L (ref 98–111)
Creatinine, Ser: 1.07 mg/dL (ref 0.61–1.24)
GFR, Estimated: >60 mL/min (ref >60)
Glucose, Bld: 134 mg/dL — ABNORMAL HIGH (ref 70–99)
Potassium: 3.6 mmol/L (ref 3.5–5.1)
Sodium: 137 mmol/L (ref 135–145)

## 2023-10-19 LAB — CBC
HCT: 41.7 % (ref 39.0–52.0)
Hemoglobin: 14 g/dL (ref 13.0–17.0)
MCH: 32.6 pg (ref 26.0–34.0)
MCHC: 33.6 g/dL (ref 30.0–36.0)
MCV: 97.2 fL (ref 80.0–100.0)
Platelets: 173 K/uL (ref 150–400)
RBC: 4.29 MIL/uL (ref 4.22–5.81)
RDW: 13.9 % (ref 11.5–15.5)
WBC: 6.3 K/uL (ref 4.0–10.5)
nRBC: 0 % (ref 0.0–0.2)

## 2023-10-19 LAB — BRAIN NATRIURETIC PEPTIDE: B Natriuretic Peptide: 50.2 pg/mL (ref 0.0–100.0)

## 2023-10-19 LAB — LACTIC ACID, PLASMA: Lactic Acid, Venous: 0.8 mmol/L (ref 0.5–1.9)

## 2023-10-19 MED ORDER — CEPHALEXIN 500 MG PO CAPS: 500.0000 mg | ORAL_CAPSULE | Freq: Three times a day (TID) | ORAL | 0 refills | Status: DC

## 2023-10-19 MED ORDER — CEPHALEXIN 500 MG PO CAPS
500.0000 mg | ORAL_CAPSULE | Freq: Once | ORAL | Status: AC
  Administered 2023-10-19: 500 mg via ORAL
  Filled 2023-10-19: qty 1

## 2023-10-19 NOTE — Discharge Instructions (Addendum)
 Your exam, labs, and Ultrasound are normal following your visit. You do not have any evidence of a blood clot in the leg. You are being treated for a cellulitis. This is infection in the skin, causing pain, redness, and swelling. Take the antibiotic as directed.  Rest with feet elevated when seated at all times.  Use your compression socks during the day, and take them off at night.  Follow-up with your primary provider for ongoing evaluation.  Return to the ED needed.

## 2023-10-19 NOTE — ED Triage Notes (Addendum)
 Pt comes with right leg pain and swelling with wound present. Pt has redness and noticeable swelling. Pt state it has been hurting for awhile. Pt has stocking on it nad took it off today and it has been hurting worse.   Pt does have hx of chf. Pt denies any cp or sob.

## 2023-10-19 NOTE — ED Notes (Signed)
 ED Provider at bedside.

## 2023-10-19 NOTE — ED Provider Notes (Signed)
 [facility] Emergency Department Provider Note     Event Date/Time   First MD Initiated Contact with Patient 10/19/23 1642     (approximate)   History   Leg Pain   HPI  Derek Wright is a 78 y.o. male with a history of prediabetes, GERD, CHF, HTN, PVD, peripheral edema, HLD, and arthritis, presents to the ED endorsing right leg pain and swelling.  Patient endorses a wound to the RLE.  Patient had been wearing a compression stocking to the leg, and noted on today's increased pain after he took the stocking off today.  Physical Exam   Triage Vital Signs: ED Triage Vitals  Encounter Vitals Group     BP 10/19/23 1454 (!) 150/81     Girls Systolic BP Percentile --      Girls Diastolic BP Percentile --      Boys Systolic BP Percentile --      Boys Diastolic BP Percentile --      Pulse Rate 10/19/23 1454 83     Resp 10/19/23 1454 18     Temp 10/19/23 1454 98 F (36.7 C)     Temp src --      SpO2 10/19/23 1454 100 %     Weight 10/19/23 1633 255 lb 15.3 oz (116.1 kg)     Height 10/19/23 1633 6' 5 (1.956 m)     Head Circumference --      Peak Flow --      Pain Score 10/19/23 1452 8     Pain Loc --      Pain Education --      Exclude from Growth Chart --     Most recent vital signs: Vitals:   10/19/23 1454  BP: (!) 150/81  Pulse: 83  Resp: 18  Temp: 98 F (36.7 C)  SpO2: 100%    General Awake, no distress. NAD HEENT NCAT. PERRL. EOMI. No rhinorrhea. Mucous membranes are moist.  CV:  Good peripheral perfusion.  Patient with 2+ pitting edema to the bilateral lower extremities.  The RLE shows some superficial erythema and increased warmth to the anterior shin.  No skin breakdown, dehiscence, purulence, or vesicular lesions noted.  No vasculitis noted. RESP:  Normal effort.  MSK:  AROM of all extremities   ED Results / Procedures / Treatments   Labs (all labs ordered are listed, but only abnormal results are displayed) Labs Reviewed   BASIC METABOLIC PANEL WITH GFR - Abnormal; Notable for the following components:      Result Value   Glucose, Bld 134 (*)    All other components within normal limits  CBC  LACTIC ACID, PLASMA  BRAIN NATRIURETIC PEPTIDE     EKG   RADIOLOGY  I personally viewed and evaluated these images as part of my medical decision making, as well as reviewing the written report by the radiologist.  ED Provider Interpretation: Negative DVT study of the RLE  US  Venous Img Lower Unilateral Right Result Date: 10/19/2023 CLINICAL DATA:  Right lower extremity swelling EXAM: RIGHT LOWER EXTREMITY VENOUS DOPPLER ULTRASOUND TECHNIQUE: Gray-scale sonography with compression, as well as color and duplex ultrasound, were performed to evaluate the deep venous system(s) from the level of the common femoral vein through the popliteal and proximal calf veins. COMPARISON:  None Available. FINDINGS: VENOUS Normal compressibility of the common femoral, superficial femoral, and popliteal veins, as well as the visualized calf veins. Visualized portions of profunda femoral vein and great saphenous  vein unremarkable. No filling defects to suggest DVT on grayscale or color Doppler imaging. Doppler waveforms show normal direction of venous flow, normal respiratory plasticity and response to augmentation. Limited views of the contralateral common femoral vein are unremarkable. OTHER None. Limitations: none IMPRESSION: Negative. Electronically Signed   By: Franky Crease M.D.   On: 10/19/2023 17:13     PROCEDURES:  Critical Care performed: No  Procedures   MEDICATIONS ORDERED IN ED: Medications  cephALEXin  (KEFLEX ) capsule 500 mg (500 mg Oral Given 10/19/23 1726)     IMPRESSION / MDM / ASSESSMENT AND PLAN / ED COURSE  I reviewed the triage vital signs and the nursing notes.                              Differential diagnosis includes, but is not limited to, cellulitis, abscess, DVT, myalgias, PVD  Patient's  presentation is most consistent with acute complicated illness / injury requiring diagnostic workup.  Patient's diagnosis is consistent with bilateral leg edema, and evidence of nonpurulent cellulitis to the RLE.  Patient with otherwise reassuring exam and workup at this time.  No evidence any acute lab abnormalities including leukocytosis or critical anemia.  Lactic acid is normal and patient presents afebrile with no signs of tachycardia or hypotension.  DVT study interpreted by me, shows no evidence of acute DVT to the RLE.  Patient reassured by his workup at this time.  Patient will be discharged home with prescriptions for Keflex .  Patient is further advised to consider use of compression athletic socks during the day, and resting with the legs elevated when seated.  Patient is to follow up with his primary provider or specialist as suggested, as needed or otherwise directed. Patient is given ED precautions to return to the ED for any worsening or new symptoms.   FINAL CLINICAL IMPRESSION(S) / ED DIAGNOSES   Final diagnoses:  Bilateral leg edema  Cellulitis of right lower extremity     Rx / DC Orders   ED Discharge Orders          Ordered    cephALEXin  (KEFLEX ) 500 MG capsule  3 times daily        10/19/23 1724             Note:  This document was prepared using Dragon voice recognition software and may include unintentional dictation errors.    Loyd Candida LULLA Aldona, PA-C 10/20/23 1631    Willo Dunnings, MD 10/20/23 2320

## 2023-10-22 ENCOUNTER — Ambulatory Visit
Admission: RE | Admit: 2023-10-22 | Discharge: 2023-10-22 | Disposition: A | Discharge status: Home / Self Care | Source: Ambulatory Visit | Attending: Nurse Practitioner | Admitting: Nurse Practitioner

## 2023-10-22 DIAGNOSIS — R42 Dizziness and giddiness: Secondary | ICD-10-CM | POA: Diagnosis not present

## 2023-10-22 DIAGNOSIS — I6523 Occlusion and stenosis of bilateral carotid arteries: Secondary | ICD-10-CM | POA: Diagnosis not present

## 2023-10-24 ENCOUNTER — Ambulatory Visit: Payer: Self-pay | Admitting: Nurse Practitioner

## 2023-10-24 NOTE — Progress Notes (Signed)
 Good morning, please let Derek Wright know his imaging returned.  Both carotid arteries have <50% plaque build up. No surgical intervention is needed for this as is considered mild.  I would recommend continuing your Atorvastatin  and ASA.  Any questions? Keep being stellar!!  Thank you for allowing me to participate in your care.  I appreciate you. Kindest regards, Zeus Marquis

## 2023-10-25 ENCOUNTER — Encounter: Payer: Self-pay | Admitting: Nurse Practitioner

## 2023-10-25 ENCOUNTER — Ambulatory Visit (INDEPENDENT_AMBULATORY_CARE_PROVIDER_SITE_OTHER): Admitting: Nurse Practitioner

## 2023-10-25 VITALS — BP 128/79 | HR 66 | Temp 97.7°F | Resp 15 | Ht 77.01 in | Wt 260.4 lb

## 2023-10-25 DIAGNOSIS — R42 Dizziness and giddiness: Secondary | ICD-10-CM

## 2023-10-25 DIAGNOSIS — I739 Peripheral vascular disease, unspecified: Secondary | ICD-10-CM

## 2023-10-25 DIAGNOSIS — L03115 Cellulitis of right lower limb: Secondary | ICD-10-CM | POA: Insufficient documentation

## 2023-10-25 MED ORDER — MUPIROCIN 2 % EX OINT: 1.0000 | TOPICAL_OINTMENT | Freq: Two times a day (BID) | CUTANEOUS | 0 refills | Status: DC

## 2023-10-25 MED ORDER — CEPHALEXIN 500 MG PO CAPS: 500.0000 mg | ORAL_CAPSULE | Freq: Three times a day (TID) | ORAL | 0 refills | Status: DC

## 2023-10-25 MED ORDER — PREDNISONE 20 MG PO TABS
40.0000 mg | ORAL_TABLET | Freq: Every day | ORAL | 0 refills | Status: DC
Start: 2023-10-25 — End: 2023-11-01

## 2023-10-25 NOTE — Assessment & Plan Note (Signed)
 Ongoing since around 2022 to 2023, started Imdur in 2023 - ?if cause.  He has not been consistently wearing compression, ?if orthostatic BP causing dizziness.  Recent labs reassuring and carotid doppler bilateral <50%.  Await Zio results and then determine next steps.  Educated patient on plan of care. Neuro exam reassuring.

## 2023-10-25 NOTE — Assessment & Plan Note (Signed)
 Ongoing, noted on exam with decreased hair pattern, diminished pulses (1+), dry skin, edema.  Recommend use of compression hose daily, on during day and off at night.  Monitor skin for breakdown.  Apply lotion daily -- may need steroid cream in future.  He has not been consistently wearing compression, ?if orthostatic BP causing dizziness. Does not wish to go to vascular at present, but would benefit this in future.

## 2023-10-25 NOTE — Assessment & Plan Note (Signed)
 Acute with two small, crusted abrasions. Suspect this is reason for infection.  Will extend Keflex  for 3 more days to = 10 days total since there is some improvement.  Mupirocin  ointment to apply to abrasions.  Prednisone  40 MG daily for 5 days to help with inflammation, educated him on this and to stop if any side effects present.  Keep area clean.  Elevated legs often throughout daytime.

## 2023-10-25 NOTE — Patient Instructions (Signed)
 Cellulitis, Adult    Cellulitis is a skin infection. The infected area is often warm, red, swollen, and sore. It occurs most often on the legs, feet, and toes, but can happen on any part of the body.  This condition can be life-threatening without treatment. It is very important to get treated right away.  What are the causes?  This condition is caused by bacteria. The bacteria enter through a break in the skin, such as:  A cut.  A burn.  A bug bite.  An animal bite.  An open sore.  A crack.  What increases the risk?  Having a weak body's defense system (immune system).  Being older than 76 years old.  Having a blood sugar problem (diabetes).  Having a long-term liver disease (cirrhosis) or kidney disease.  Being very overweight (obese).  Having a skin problem, such as:  An itchy rash.  A rash caused by a fungus.  A rash with blisters.  Slow movement of blood in the veins (venous stasis).  Fluid buildup below the skin (edema).  This condition is more likely to occur in people who:  Have open cuts, burns, bites, or scrapes on the skin.  Have been treated with high-energy rays (radiation).  Use IV drugs.  What are the signs or symptoms?  Skin that:  Looks red or purple, or slightly darker than your usual skin color.  Has streaks.  Has spots.  Is swollen.  Is sore or painful when you touch it.  Is warm.  A fever.  Chills.  Blisters.  Tiredness (fatigue).  How is this treated?  Medicines to treat infections or allergies.  Rest.  Placing cold or warm cloths on the skin.  Staying in the hospital, if the condition is very bad. You may need medicines through an IV.  Follow these instructions at home:  Medicines  Take over-the-counter and prescription medicines only as told by your doctor.  If you were prescribed antibiotics, take them as told by your doctor. Do not stop using them even if you start to feel better.  General instructions  Drink enough fluid to keep your pee (urine) pale yellow.  Do not touch or rub the  infected area.  Raise (elevate) the infected area above the level of your heart while you are sitting or lying down.  Return to your normal activities when your doctor says that it is safe.  Place cold or warm cloths on the area as told by your doctor.  Keep all follow-up visits. Your doctor will need to make sure that a more serious infection is not developing.  Contact a doctor if:  You have a fever.  You do not start to get better after 1-2 days of treatment.  Your bone or joint under the infected area starts to hurt after the skin has healed.  Your infection comes back in the same area or another area. Signs of this may include:  You have a swollen bump in the area.  Your red area gets larger, turns dark in color, or hurts more.  You have more fluid coming from the wound.  Pus or a bad smell develops in your infected area.  You have more pain.  You feel sick and have muscle aches and weakness.  You develop vomiting or watery poop that will not go away.  Get help right away if:  You see red streaks coming from the area.  You notice the skin turns purple or black and falls  off.  These symptoms may be an emergency. Get help right away. Call 911.  Do not wait to see if the symptoms will go away.  Do not drive yourself to the hospital.  This information is not intended to replace advice given to you by your health care provider. Make sure you discuss any questions you have with your health care provider.  Document Revised: 09/27/2021 Document Reviewed: 09/27/2021  Elsevier Patient Education  2024 [employer].

## 2023-10-25 NOTE — Progress Notes (Signed)
 BP 128/79 (BP Location: Left Arm, Patient Position: Sitting, Cuff Size: Large)   Pulse 66   Temp 97.7 F (36.5 C) (Oral)   Resp 15   Ht 6' 5.01 (1.956 m)   Wt 260 lb 6.4 oz (118.1 kg)   SpO2 99%   BMI 30.87 kg/m    Subjective:    [name], male    DOB: [birth year], 78 y.o.   MRN: [id number]  HPI: Derek Wright is a 78 y.o. male  Chief Complaint  Patient presents with   Follow-up    ED visit for bilateral leg edema. Right leg visibly more swollen and red than the left. Taking the antibiotics as prescribed. Still very sore.    ER FOLLOW UP Follow-up today for cellulitis to right lower shin. Was wearing compression hose recently and then the infection started. Is elevating legs often at home.  At baseline has swelling to BLE (PVD).  Has not seen vascular, wishes to hold off on this due to cost. Taking Keflex  at this time, has 4-5 pills left.    Recent carotid imaging showed <50% bilateral, continues to have episodes of dizziness which was recently seen for in office.  Wearing heart monitor, Zio, at present. Time since discharge: 6 days Hospital/facility: [facility] Diagnosis: BLE and cellulitis right lower leg Procedures/tests: venous doppler right leg -- negative for DVT Consultants: none New medications: Keflex  Discharge instructions:  Follow-up with PCP Status: stable   Relevant past medical, surgical, family and social history reviewed and updated as indicated. Interim medical history since our last visit reviewed. Allergies and medications reviewed and updated.  Review of Systems  Constitutional:  Negative for activity change, diaphoresis, fatigue and fever.  Respiratory:  Negative for cough, chest tightness, shortness of breath and wheezing.   Cardiovascular:  Negative for chest pain, palpitations and leg swelling.  Gastrointestinal: Negative.   Skin:  Positive for wound.  Neurological:  Positive for dizziness. Negative for syncope, facial asymmetry, speech  difficulty, weakness, light-headedness and headaches.  Psychiatric/Behavioral: Negative.      Per HPI unless specifically indicated above     Objective:    BP 128/79 (BP Location: Left Arm, Patient Position: Sitting, Cuff Size: Large)   Pulse 66   Temp 97.7 F (36.5 C) (Oral)   Resp 15   Ht 6' 5.01 (1.956 m)   Wt 260 lb 6.4 oz (118.1 kg)   SpO2 99%   BMI 30.87 kg/m   Wt Readings from Last 3 Encounters:  10/25/23 260 lb 6.4 oz (118.1 kg)  10/19/23 255 lb 15.3 oz (116.1 kg)  10/16/23 256 lb (116.1 kg)    Physical Exam Vitals and nursing note reviewed.  Constitutional:      General: Derek Wright is awake. Derek Wright is not in acute distress.    Appearance: Derek Wright is well-developed and well-groomed. Derek Wright is obese. Derek Wright is not ill-appearing or toxic-appearing.  HENT:     Head: Normocephalic.     Right Ear: Hearing and external ear normal.     Left Ear: Hearing and external ear normal.  Eyes:     General: Lids are normal.     Extraocular Movements: Extraocular movements intact.     Conjunctiva/sclera: Conjunctivae normal.  Neck:     Thyroid : No thyromegaly.     Vascular: No carotid bruit.  Cardiovascular:     Rate and Rhythm: Normal rate and regular rhythm.     Heart sounds: Normal heart sounds. No murmur heard.  No gallop.  Pulmonary:     Effort: No accessory muscle usage or respiratory distress.     Breath sounds: Normal breath sounds.  Abdominal:     General: Bowel sounds are normal. There is no distension.     Palpations: Abdomen is soft.     Tenderness: There is no abdominal tenderness.  Musculoskeletal:     Cervical back: Full passive range of motion without pain.     Right lower leg: 2+ Edema present.     Left lower leg: 1+ Edema present.  Lymphadenopathy:     Cervical: No cervical adenopathy.  Skin:    General: Skin is warm.     Capillary Refill: Capillary refill takes less than 2 seconds.      Neurological:     Mental Status: Derek Wright is alert and oriented to person, place, and  time.     Cranial Nerves: Cranial nerves 2-12 are intact.     Coordination: Coordination is intact.     Gait: Gait is intact.     Deep Tendon Reflexes: Reflexes are normal and symmetric.     Reflex Scores:      Brachioradialis reflexes are 2+ on the right side and 2+ on the left side.      Patellar reflexes are 2+ on the right side and 2+ on the left side. Psychiatric:        Attention and Perception: Attention normal.        Mood and Affect: Mood normal.        Speech: Speech normal.        Behavior: Behavior normal. Behavior is cooperative.        Thought Content: Thought content normal.    Results for orders placed or performed during the hospital encounter of 10/19/23  CBC   Collection Time: 10/19/23  2:54 PM  Result Value Ref Range   WBC 6.3 4.0 - 10.5 K/uL   RBC 4.29 4.22 - 5.81 MIL/uL   Hemoglobin 14.0 13.0 - 17.0 g/dL   HCT 58.2 60.9 - 47.9 %   MCV 97.2 80.0 - 100.0 fL   MCH 32.6 26.0 - 34.0 pg   MCHC 33.6 30.0 - 36.0 g/dL   RDW 86.0 88.4 - 84.4 %   Platelets 173 150 - 400 K/uL   nRBC 0.0 0.0 - 0.2 %  Basic metabolic panel   Collection Time: 10/19/23  2:54 PM  Result Value Ref Range   Sodium 137 135 - 145 mmol/L   Potassium 3.6 3.5 - 5.1 mmol/L   Chloride 102 98 - 111 mmol/L   CO2 26 22 - 32 mmol/L   Glucose, Bld 134 (H) 70 - 99 mg/dL   BUN 15 8 - 23 mg/dL   Creatinine, Ser 8.92 0.61 - 1.24 mg/dL   Calcium  8.9 8.9 - 10.3 mg/dL   GFR, Estimated >39 >39 mL/min   Anion gap 9 5 - 15  Lactic acid, plasma   Collection Time: 10/19/23  2:54 PM  Result Value Ref Range   Lactic Acid, Venous 0.8 0.5 - 1.9 mmol/L  Brain natriuretic peptide   Collection Time: 10/19/23  2:54 PM  Result Value Ref Range   B Natriuretic Peptide 50.2 0.0 - 100.0 pg/mL      Assessment & Plan:   Problem List Items Addressed This Visit       Cardiovascular and Mediastinum   PVD (peripheral vascular disease) (HCC)   Ongoing, noted on exam with decreased hair pattern, diminished pulses  (1+),  dry skin, edema.  Recommend use of compression hose daily, on during day and off at night.  Monitor skin for breakdown.  Apply lotion daily -- may need steroid cream in future.  Derek Wright has not been consistently wearing compression, ?if orthostatic BP causing dizziness. Does not wish to go to vascular at present, but would benefit this in future.        Other   Dizziness - Primary   Ongoing since around 2022 to 2023, started Imdur in 2023 - ?if cause.  Derek Wright has not been consistently wearing compression, ?if orthostatic BP causing dizziness.  Recent labs reassuring and carotid doppler bilateral <50%.  Await Zio results and then determine next steps.  Educated patient on plan of care. Neuro exam reassuring.      Cellulitis of right anterior lower leg   Acute with two small, crusted abrasions. Suspect this is reason for infection.  Will extend Keflex  for 3 more days to = 10 days total since there is some improvement.  Mupirocin  ointment to apply to abrasions.  Prednisone  40 MG daily for 5 days to help with inflammation, educated him on this and to stop if any side effects present.  Keep area clean.  Elevated legs often throughout daytime.        Follow up plan: Return in about 1 week (around 11/01/2023) for Cellulitis check right shin.

## 2023-10-28 NOTE — Patient Instructions (Addendum)
 Be Involved in Caring For Your Health:  Taking Medications When medications are taken as directed, they can greatly improve your health. But if they are not taken as prescribed, they may not work. In some cases, not taking them correctly can be harmful. To help ensure your treatment remains effective and safe, understand your medications and how to take them. Bring your medications to each visit for review by your provider.  Your lab results, notes, and after visit summary will be available on My Chart. We strongly encourage you to use this feature. If lab results are abnormal the clinic will contact you with the appropriate steps. If the clinic does not contact you assume the results are satisfactory. You can always view your results on My Chart. If you have questions regarding your health or results, please contact the clinic during office hours. You can also ask questions on My Chart.  We at [facility] are grateful that you chose us  to provide your care. We strive to provide evidence-based and compassionate care and are always looking for feedback. If you get a survey from the clinic please complete this so we can hear your opinions.  Cellulitis, Adult  Cellulitis is a skin infection. The infected area is often warm, red, swollen, and sore. It occurs most often on the legs, feet, and toes, but can happen on any part of the body. This condition can be life-threatening without treatment. It is very important to get treated right away. What are the causes? This condition is caused by bacteria. The bacteria enter through a break in the skin, such as: A cut. A burn. A bug bite. An animal bite. An open sore. A crack. What increases the risk? Having a weak body's defense system (immune system). Being older than 76 years old. Having a blood sugar problem (diabetes). Having a long-term liver disease (cirrhosis) or kidney disease. Being very overweight (obese). Having a skin problem,  such as: An itchy rash. A rash caused by a fungus. A rash with blisters. Slow movement of blood in the veins (venous stasis). Fluid buildup below the skin (edema). This condition is more likely to occur in people who: Have open cuts, burns, bites, or scrapes on the skin. Have been treated with high-energy rays (radiation). Use IV drugs. What are the signs or symptoms? Skin that: Looks red or purple, or slightly darker than your usual skin color. Has streaks. Has spots. Is swollen. Is sore or painful when you touch it. Is warm. A fever. Chills. Blisters. Tiredness (fatigue). How is this treated? Medicines to treat infections or allergies. Rest. Placing cold or warm cloths on the skin. Staying in the hospital, if the condition is very bad. You may need medicines through an IV. Follow these instructions at home: Medicines Take over-the-counter and prescription medicines only as told by your doctor. If you were prescribed antibiotics, take them as told by your doctor. Do not stop using them even if you start to feel better. General instructions Drink enough fluid to keep your pee (urine) pale yellow. Do not touch or rub the infected area. Raise (elevate) the infected area above the level of your heart while you are sitting or lying down. Return to your normal activities when your doctor says that it is safe. Place cold or warm cloths on the area as told by your doctor. Keep all follow-up visits. Your doctor will need to make sure that a more serious infection is not developing. Contact a doctor if: You  have a fever. You do not start to get better after 1-2 days of treatment. Your bone or joint under the infected area starts to hurt after the skin has healed. Your infection comes back in the same area or another area. Signs of this may include: You have a swollen bump in the area. Your red area gets larger, turns dark in color, or hurts more. You have more fluid coming from  the wound. Pus or a bad smell develops in your infected area. You have more pain. You feel sick and have muscle aches and weakness. You develop vomiting or watery poop that will not go away. Get help right away if: You see red streaks coming from the area. You notice the skin turns purple or black and falls off. These symptoms may be an emergency. Get help right away. Call 911. Do not wait to see if the symptoms will go away. Do not drive yourself to the hospital. This information is not intended to replace advice given to you by your health care provider. Make sure you discuss any questions you have with your health care provider. Document Revised: 09/27/2021 Document Reviewed: 09/27/2021 Elsevier Patient Education  2024 [employer].

## 2023-10-31 ENCOUNTER — Telehealth: Payer: Self-pay

## 2023-10-31 NOTE — Telephone Encounter (Signed)
 Patient came into office. Not really aware he had a follow up appointment tomorrow. He does question if provider can send in further medication as the area is healing but worry's that he will need more antibiotics.

## 2023-11-01 ENCOUNTER — Ambulatory Visit (INDEPENDENT_AMBULATORY_CARE_PROVIDER_SITE_OTHER): Admitting: Nurse Practitioner

## 2023-11-01 ENCOUNTER — Encounter: Payer: Self-pay | Admitting: Nurse Practitioner

## 2023-11-01 VITALS — BP 138/84 | HR 70 | Temp 98.1°F | Resp 15 | Ht 77.01 in | Wt 260.0 lb

## 2023-11-01 DIAGNOSIS — L03115 Cellulitis of right lower limb: Secondary | ICD-10-CM

## 2023-11-01 DIAGNOSIS — Z23 Encounter for immunization: Secondary | ICD-10-CM

## 2023-11-01 MED ORDER — PREDNISONE 20 MG PO TABS: 40.0000 mg | ORAL_TABLET | Freq: Every day | ORAL | 0 refills | Status: AC

## 2023-11-01 MED ORDER — MUPIROCIN 2 % EX OINT: 1.0000 | TOPICAL_OINTMENT | Freq: Two times a day (BID) | CUTANEOUS | 3 refills | Status: AC

## 2023-11-01 MED ORDER — CEPHALEXIN 500 MG PO CAPS: 500.0000 mg | ORAL_CAPSULE | Freq: Three times a day (TID) | ORAL | 0 refills | Status: AC

## 2023-11-01 NOTE — Assessment & Plan Note (Signed)
 Acute and improving. Suspect this is reason for infection.  Will extend Keflex  for 4 more days. Continue Mupirocin  ointment to apply to abrasions.  One more round of Prednisone  40 MG daily for 5 days to help with inflammation, educated him on this and to stop if any side effects present.  Keep area clean.  Elevated legs often throughout daytime.

## 2023-11-01 NOTE — Progress Notes (Signed)
 BP 138/84 (BP Location: Left Arm, Patient Position: Sitting, Cuff Size: Large)   Pulse 70   Temp 98.1 F (36.7 C) (Oral)   Resp 15   Ht 6' 5.01 (1.956 m)   Wt 260 lb (117.9 kg)   SpO2 96%   BMI 30.83 kg/m    Subjective:    [name], male    DOB: [birth year], 78 y.o.   MRN: [id number]  HPI: Derek Wright is a 78 y.o. male  Chief Complaint  Patient presents with   Cellulitis    Left lower leg seems to be resoling but would like more antibiotics.    SKIN INFECTION Feels like right leg is getting a whole lot better. Can get out of bed without cane. Has baseline edema, not seen vascular in past. Duration: weeks Location: right lower leg History of trauma in area: no Pain: yes Quality: yes Severity: mild 1/10 or less Redness: mild Swelling: mild, gone down a whole lot Oozing: no Pus: no Fevers: no Nausea/vomiting: no Status: improving Treatments attempted:antibiotics  Tetanus: UTD   Relevant past medical, surgical, family and social history reviewed and updated as indicated. Interim medical history since our last visit reviewed. Allergies and medications reviewed and updated.  Review of Systems  Constitutional:  Negative for activity change, diaphoresis, fatigue and fever.  Respiratory:  Negative for cough, chest tightness, shortness of breath and wheezing.   Cardiovascular:  Negative for chest pain, palpitations and leg swelling.  Gastrointestinal: Negative.   Skin:  Positive for wound.  Neurological: Negative.   Psychiatric/Behavioral: Negative.      Per HPI unless specifically indicated above     Objective:    BP 138/84 (BP Location: Left Arm, Patient Position: Sitting, Cuff Size: Large)   Pulse 70   Temp 98.1 F (36.7 C) (Oral)   Resp 15   Ht 6' 5.01 (1.956 m)   Wt 260 lb (117.9 kg)   SpO2 96%   BMI 30.83 kg/m   Wt Readings from Last 3 Encounters:  11/01/23 260 lb (117.9 kg)  10/25/23 260 lb 6.4 oz (118.1 kg)  10/19/23 255 lb 15.3  oz (116.1 kg)    Physical Exam Vitals and nursing note reviewed.  Constitutional:      General: He is awake. He is not in acute distress.    Appearance: He is well-developed and well-groomed. He is obese. He is not ill-appearing or toxic-appearing.  HENT:     Head: Normocephalic.     Right Ear: Hearing and external ear normal.     Left Ear: Hearing and external ear normal.  Eyes:     General: Lids are normal.     Extraocular Movements: Extraocular movements intact.     Conjunctiva/sclera: Conjunctivae normal.  Neck:     Thyroid : No thyromegaly.     Vascular: No carotid bruit.  Cardiovascular:     Rate and Rhythm: Normal rate and regular rhythm.     Heart sounds: Normal heart sounds. No murmur heard.    No gallop.  Pulmonary:     Effort: No accessory muscle usage or respiratory distress.     Breath sounds: Normal breath sounds.  Abdominal:     General: Bowel sounds are normal. There is no distension.     Palpations: Abdomen is soft.     Tenderness: There is no abdominal tenderness.  Musculoskeletal:     Cervical back: Full passive range of motion without pain.     Right lower leg:  2+ Edema present.     Left lower leg: 1+ Edema present.  Lymphadenopathy:     Cervical: No cervical adenopathy.  Skin:    General: Skin is warm.     Capillary Refill: Capillary refill takes less than 2 seconds.      Neurological:     Mental Status: He is alert and oriented to person, place, and time.     Cranial Nerves: Cranial nerves 2-12 are intact.     Deep Tendon Reflexes: Reflexes are normal and symmetric.     Reflex Scores:      Brachioradialis reflexes are 2+ on the right side and 2+ on the left side.      Patellar reflexes are 2+ on the right side and 2+ on the left side. Psychiatric:        Attention and Perception: Attention normal.        Mood and Affect: Mood normal.        Speech: Speech normal.        Behavior: Behavior normal. Behavior is cooperative.        Thought Content:  Thought content normal.    Results for orders placed or performed during the hospital encounter of 10/19/23  CBC   Collection Time: 10/19/23  2:54 PM  Result Value Ref Range   WBC 6.3 4.0 - 10.5 K/uL   RBC 4.29 4.22 - 5.81 MIL/uL   Hemoglobin 14.0 13.0 - 17.0 g/dL   HCT 58.2 60.9 - 47.9 %   MCV 97.2 80.0 - 100.0 fL   MCH 32.6 26.0 - 34.0 pg   MCHC 33.6 30.0 - 36.0 g/dL   RDW 86.0 88.4 - 84.4 %   Platelets 173 150 - 400 K/uL   nRBC 0.0 0.0 - 0.2 %  Basic metabolic panel   Collection Time: 10/19/23  2:54 PM  Result Value Ref Range   Sodium 137 135 - 145 mmol/L   Potassium 3.6 3.5 - 5.1 mmol/L   Chloride 102 98 - 111 mmol/L   CO2 26 22 - 32 mmol/L   Glucose, Bld 134 (H) 70 - 99 mg/dL   BUN 15 8 - 23 mg/dL   Creatinine, Ser 8.92 0.61 - 1.24 mg/dL   Calcium  8.9 8.9 - 10.3 mg/dL   GFR, Estimated >39 >39 mL/min   Anion gap 9 5 - 15  Lactic acid, plasma   Collection Time: 10/19/23  2:54 PM  Result Value Ref Range   Lactic Acid, Venous 0.8 0.5 - 1.9 mmol/L  Brain natriuretic peptide   Collection Time: 10/19/23  2:54 PM  Result Value Ref Range   B Natriuretic Peptide 50.2 0.0 - 100.0 pg/mL      Assessment & Plan:   Problem List Items Addressed This Visit       Other   Cellulitis of right anterior lower leg - Primary   Acute and improving. Suspect this is reason for infection.  Will extend Keflex  for 4 more days. Continue Mupirocin  ointment to apply to abrasions.  One more round of Prednisone  40 MG daily for 5 days to help with inflammation, educated him on this and to stop if any side effects present.  Keep area clean.  Elevated legs often throughout daytime.      Other Visit Diagnoses       Flu vaccine need       Flu vaccine today, educated patient.   Relevant Orders   Flu vaccine HIGH DOSE PF(Fluzone Trivalent) (Completed)  Follow up plan: Return for as scheduled October 6th.

## 2023-11-07 ENCOUNTER — Telehealth: Payer: Self-pay

## 2023-11-07 NOTE — Telephone Encounter (Unsigned)
 Copied from CRM #[id number]. Topic: Clinical - Medication Question >> Nov 07, 2023  2:29 PM Santiya F wrote: Reason for CRM: Patient is calling in because he was prescribed an antibiotic and he wanted to know if he was able to have another round of antibiotics because he still has some inflammation. Patient is requesting a callback.

## 2023-11-08 MED ORDER — PREDNISONE 20 MG PO TABS
40.0000 mg | ORAL_TABLET | Freq: Every day | ORAL | 0 refills | Status: AC
Start: 2023-11-08 — End: 2023-11-13

## 2023-11-08 NOTE — Telephone Encounter (Signed)
 Forwarding to PCP to advise.

## 2023-11-09 ENCOUNTER — Ambulatory Visit: Payer: Self-pay

## 2023-11-09 NOTE — Telephone Encounter (Signed)
 Ok for E2C2 to review.  Attempted call to patient however unable to reach nor leave a message. Please advise that he can not continue on the antibiotics but provider did send in something for him. Please refer to previous provider message for further information.

## 2023-11-09 NOTE — Telephone Encounter (Signed)
 FYI Only or Action Required?: Action required by provider: medication refill request.  Patient was last seen in primary care on 11/01/2023 by Cannady, Jolene T, NP.  Called Nurse Triage reporting Abrasion.  Symptoms began several weeks ago.  Interventions attempted: Prescription medications: atbx.  Symptoms are: gradually improving.  Triage Disposition: Home Care  Patient/caregiver understands and will follow disposition?: Yes  Copied from CRM [id number]. Topic: Clinical - Medication Question >> Nov 09, 2023 11:25 AM Amy B wrote: Reason for CRM: Patient states he needs a medication refill for his skin infection but he cannot remember the name.  Please advise.   Reason for Disposition  Heat rash, questions about  Answer Assessment - Initial Assessment Questions Pt states that he has an infection on his leg that he went to the ER for, then saw PCP and they wrote for him to have an atbx. He states that he finished it yesterday. He states that it is getting better but not completely gone. He'd like to continue the antibiotics until it's gone to make sure that it doesn't come back.     1. APPEARANCE of RASH: What does the rash look like? (e.g., blisters, dry flaky skin, red spots, redness, sores)    Previously diagnosed cellulitis 2. SIZE: How big are the spots? (e.g., tip of pen, eraser, coin; inches, centimeters)     Getting better 3. LOCATION: Where is the rash located? Right leg 4. COLOR: What color is the rash? (Note: It is difficult to assess rash color in people with darker-colored skin. When this situation occurs, simply ask the caller to describe what they see.)    Still slightly red 5. ONSET: When did the rash begin?     2 Friday ago 6. FEVER: Do you have a fever? If Yes, ask: What is your temperature, how was it measured, and when did it start?     no  8. CAUSE: What do you think is causing the rash?     Cellulitis finished atbx yesterday  10. OTHER  SYMPTOMS: Do you have any other symptoms? (e.g., dizziness, headache, sore throat, joint pain)       no  Protocols used: Rash or Redness - [facility]

## 2023-11-17 NOTE — Patient Instructions (Incomplete)
 Dizziness Dizziness is a common problem. It makes you feel unsteady or light-headed. You may feel like you're about to faint. Dizziness can lead to getting hurt if you stumble or fall. It's more common to feel dizzy if you're an older adult. Many things can cause you to feel dizzy. These include: Medicines. Dehydration. This is when there's not enough water in your body. Illness. Follow these instructions at home: Eating and drinking  Drink enough fluid to keep your pee (urine) pale yellow. This helps keep you from getting dehydrated. Try to drink more clear fluids, such as water. Do not drink alcohol. Try to limit how much caffeine you take in. Try to limit how much salt, also called sodium, you take in. Activity Try not to make quick movements. Stand up slowly from sitting in a chair. Steady yourself until you feel okay. In the morning, first sit up on the side of the bed. When you feel okay, hold onto something and slowly stand up. Do this until you know that your balance is okay. If you need to stand in one place for a long time, move your legs often. Tighten and relax the muscles in your legs while you're standing. Do not drive or use machines if you feel dizzy. Avoid bending down if you feel dizzy. Place items in your home so you can reach them without leaning over. Lifestyle Do not smoke, vape, or use products with nicotine or tobacco in them. If you need help quitting, talk with your health care provider. Try to lower your stress level. You can do this by using methods like yoga or meditation. Talk with your provider if you need help. General instructions Watch your dizziness for any changes. Take your medicines only as told by your provider. Talk with your provider if you think you're dizzy because of a medicine you're taking. Tell a friend or a family member that you're feeling dizzy. If they spot any changes in your behavior, have them call your provider. Contact a health care  provider if: Your dizziness doesn't go away, or you have new symptoms. Your dizziness gets worse. You feel like you may vomit. You have trouble hearing. You have a fever. You have neck pain or a stiff neck. You fall or get hurt. Get help right away if: You vomit each time you eat or drink. You have watery poop and can't eat or drink. You have trouble talking, walking, swallowing, or using your arms, hands, or legs. You feel very weak. You're bleeding. You're not thinking clearly, or you have trouble forming sentences. A friend or family member may spot this. Your vision changes, or you get a very bad headache. These symptoms may be an emergency. Call 911 right away. Do not wait to see if the symptoms will go away. Do not drive yourself to the hospital. This information is not intended to replace advice given to you by your health care provider. Make sure you discuss any questions you have with your health care provider. Document Revised: 11/02/2022 Document Reviewed: 03/16/2022 Elsevier Patient Education  2024 [employer].

## 2023-11-19 ENCOUNTER — Ambulatory Visit: Admitting: Nurse Practitioner

## 2023-11-23 ENCOUNTER — Ambulatory Visit (INDEPENDENT_AMBULATORY_CARE_PROVIDER_SITE_OTHER): Admitting: Nurse Practitioner

## 2023-11-23 ENCOUNTER — Encounter: Payer: Self-pay | Admitting: Nurse Practitioner

## 2023-11-23 VITALS — BP 129/80 | HR 43 | Temp 97.9°F | Resp 15 | Ht 77.01 in | Wt 258.0 lb

## 2023-11-23 DIAGNOSIS — L03115 Cellulitis of right lower limb: Secondary | ICD-10-CM

## 2023-11-23 DIAGNOSIS — R42 Dizziness and giddiness: Secondary | ICD-10-CM | POA: Diagnosis not present

## 2023-11-23 NOTE — Progress Notes (Signed)
 BP 129/80 (BP Location: Left Arm, Patient Position: Sitting, Cuff Size: Large)   Pulse (!) 43   Temp 97.9 F (36.6 C) (Oral)   Resp 15   Ht 6' 5.01 (1.956 m)   Wt 258 lb (117 kg)   SpO2 98%   BMI 30.59 kg/m    Subjective:    [name], male    DOB: 10-02-1945, 78 y.o.   MRN: [id number]  HPI: Derek Wright is a 78 y.o. male  Chief Complaint  Patient presents with   Dizziness    Has improved   Wound Check    Doing much better. 4 more prednisone .    SKIN INFECTION Treated with Prednisone  and abx therapy for cellulitis, first noted on 10/19/23.  He had also reported dizziness prior to this, but reports this has improved. He reports his wound is improving, a little bit sore still.  Puts cream on it which helps.  Still has 4 steroid pills left. Duration: weeks Location: right lower leg History of trauma in area: no Pain: no Redness: improving Swelling: at baseline Oozing: no Pus: no Fevers: no Nausea/vomiting: no Status: better Treatments attempted:antibiotics and warm compresses  Tetanus: UTD   Relevant past medical, surgical, family and social history reviewed and updated as indicated. Interim medical history since our last visit reviewed. Allergies and medications reviewed and updated.  Review of Systems  Constitutional:  Negative for activity change, diaphoresis, fatigue and fever.  Respiratory:  Negative for cough, chest tightness, shortness of breath and wheezing.   Cardiovascular:  Negative for chest pain, palpitations and leg swelling.  Gastrointestinal: Negative.   Skin:  Positive for wound (improved).  Neurological: Negative.   Psychiatric/Behavioral: Negative.      Per HPI unless specifically indicated above     Objective:    BP 129/80 (BP Location: Left Arm, Patient Position: Sitting, Cuff Size: Large)   Pulse (!) 43   Temp 97.9 F (36.6 C) (Oral)   Resp 15   Ht 6' 5.01 (1.956 m)   Wt 258 lb (117 kg)   SpO2 98%   BMI 30.59 kg/m    Wt Readings from Last 3 Encounters:  11/23/23 258 lb (117 kg)  11/01/23 260 lb (117.9 kg)  10/25/23 260 lb 6.4 oz (118.1 kg)    Physical Exam Vitals and nursing note reviewed.  Constitutional:      General: He is awake. He is not in acute distress.    Appearance: He is well-developed and well-groomed. He is obese. He is not ill-appearing or toxic-appearing.  HENT:     Head: Normocephalic.     Right Ear: Hearing and external ear normal.     Left Ear: Hearing and external ear normal.  Eyes:     General: Lids are normal.     Extraocular Movements: Extraocular movements intact.     Conjunctiva/sclera: Conjunctivae normal.  Neck:     Thyroid : No thyromegaly.     Vascular: No carotid bruit.  Cardiovascular:     Rate and Rhythm: Normal rate and regular rhythm.     Heart sounds: Normal heart sounds. No murmur heard.    No gallop.  Pulmonary:     Effort: No accessory muscle usage or respiratory distress.     Breath sounds: Normal breath sounds.  Abdominal:     General: Bowel sounds are normal. There is no distension.     Palpations: Abdomen is soft.     Tenderness: There is no abdominal tenderness.  Musculoskeletal:     Cervical back: Full passive range of motion without pain.     Right lower leg: 2+ Edema present.     Left lower leg: 2+ Edema present.  Lymphadenopathy:     Cervical: No cervical adenopathy.  Skin:    General: Skin is warm.     Capillary Refill: Capillary refill takes less than 2 seconds.      Neurological:     Mental Status: He is alert and oriented to person, place, and time.     Cranial Nerves: Cranial nerves 2-12 are intact.     Deep Tendon Reflexes: Reflexes are normal and symmetric.     Reflex Scores:      Brachioradialis reflexes are 2+ on the right side and 2+ on the left side.      Patellar reflexes are 2+ on the right side and 2+ on the left side. Psychiatric:        Attention and Perception: Attention normal.        Mood and Affect: Mood normal.         Speech: Speech normal.        Behavior: Behavior normal. Behavior is cooperative.        Thought Content: Thought content normal.     Results for orders placed or performed during the hospital encounter of 10/19/23  CBC   Collection Time: 10/19/23  2:54 PM  Result Value Ref Range   WBC 6.3 4.0 - 10.5 K/uL   RBC 4.29 4.22 - 5.81 MIL/uL   Hemoglobin 14.0 13.0 - 17.0 g/dL   HCT 58.2 60.9 - 47.9 %   MCV 97.2 80.0 - 100.0 fL   MCH 32.6 26.0 - 34.0 pg   MCHC 33.6 30.0 - 36.0 g/dL   RDW 86.0 88.4 - 84.4 %   Platelets 173 150 - 400 K/uL   nRBC 0.0 0.0 - 0.2 %  Basic metabolic panel   Collection Time: 10/19/23  2:54 PM  Result Value Ref Range   Sodium 137 135 - 145 mmol/L   Potassium 3.6 3.5 - 5.1 mmol/L   Chloride 102 98 - 111 mmol/L   CO2 26 22 - 32 mmol/L   Glucose, Bld 134 (H) 70 - 99 mg/dL   BUN 15 8 - 23 mg/dL   Creatinine, Ser 8.92 0.61 - 1.24 mg/dL   Calcium  8.9 8.9 - 10.3 mg/dL   GFR, Estimated >39 >39 mL/min   Anion gap 9 5 - 15  Lactic acid, plasma   Collection Time: 10/19/23  2:54 PM  Result Value Ref Range   Lactic Acid, Venous 0.8 0.5 - 1.9 mmol/L  Brain natriuretic peptide   Collection Time: 10/19/23  2:54 PM  Result Value Ref Range   B Natriuretic Peptide 50.2 0.0 - 100.0 pg/mL      Assessment & Plan:   Problem List Items Addressed This Visit       Other   Dizziness   Acute and improved, no further episodes.  Suspect was inner ear.      Cellulitis of right anterior lower leg - Primary   Acute and now improved.  Recommend he complete full course of treatment.  Keep area clean.  Elevated legs often throughout daytime. Would benefit vascular visit in future for possible lymphedema pumps.        Follow up plan: Return for as scheduled in December for physical.

## 2023-11-23 NOTE — Assessment & Plan Note (Signed)
 Acute and improved, no further episodes.  Suspect was inner ear.

## 2023-11-23 NOTE — Patient Instructions (Signed)
 Cellulitis, Adult    Cellulitis is a skin infection. The infected area is often warm, red, swollen, and sore. It occurs most often on the legs, feet, and toes, but can happen on any part of the body.  This condition can be life-threatening without treatment. It is very important to get treated right away.  What are the causes?  This condition is caused by bacteria. The bacteria enter through a break in the skin, such as:  A cut.  A burn.  A bug bite.  An animal bite.  An open sore.  A crack.  What increases the risk?  Having a weak body's defense system (immune system).  Being older than 76 years old.  Having a blood sugar problem (diabetes).  Having a long-term liver disease (cirrhosis) or kidney disease.  Being very overweight (obese).  Having a skin problem, such as:  An itchy rash.  A rash caused by a fungus.  A rash with blisters.  Slow movement of blood in the veins (venous stasis).  Fluid buildup below the skin (edema).  This condition is more likely to occur in people who:  Have open cuts, burns, bites, or scrapes on the skin.  Have been treated with high-energy rays (radiation).  Use IV drugs.  What are the signs or symptoms?  Skin that:  Looks red or purple, or slightly darker than your usual skin color.  Has streaks.  Has spots.  Is swollen.  Is sore or painful when you touch it.  Is warm.  A fever.  Chills.  Blisters.  Tiredness (fatigue).  How is this treated?  Medicines to treat infections or allergies.  Rest.  Placing cold or warm cloths on the skin.  Staying in the hospital, if the condition is very bad. You may need medicines through an IV.  Follow these instructions at home:  Medicines  Take over-the-counter and prescription medicines only as told by your doctor.  If you were prescribed antibiotics, take them as told by your doctor. Do not stop using them even if you start to feel better.  General instructions  Drink enough fluid to keep your pee (urine) pale yellow.  Do not touch or rub the  infected area.  Raise (elevate) the infected area above the level of your heart while you are sitting or lying down.  Return to your normal activities when your doctor says that it is safe.  Place cold or warm cloths on the area as told by your doctor.  Keep all follow-up visits. Your doctor will need to make sure that a more serious infection is not developing.  Contact a doctor if:  You have a fever.  You do not start to get better after 1-2 days of treatment.  Your bone or joint under the infected area starts to hurt after the skin has healed.  Your infection comes back in the same area or another area. Signs of this may include:  You have a swollen bump in the area.  Your red area gets larger, turns dark in color, or hurts more.  You have more fluid coming from the wound.  Pus or a bad smell develops in your infected area.  You have more pain.  You feel sick and have muscle aches and weakness.  You develop vomiting or watery poop that will not go away.  Get help right away if:  You see red streaks coming from the area.  You notice the skin turns purple or black and falls  off.  These symptoms may be an emergency. Get help right away. Call 911.  Do not wait to see if the symptoms will go away.  Do not drive yourself to the hospital.  This information is not intended to replace advice given to you by your health care provider. Make sure you discuss any questions you have with your health care provider.  Document Revised: 09/27/2021 Document Reviewed: 09/27/2021  Elsevier Patient Education  2024 [employer].

## 2023-11-23 NOTE — Assessment & Plan Note (Signed)
 Acute and now improved.  Recommend he complete full course of treatment.  Keep area clean.  Elevated legs often throughout daytime. Would benefit vascular visit in future for possible lymphedema pumps.

## 2023-12-11 ENCOUNTER — Other Ambulatory Visit: Payer: Self-pay | Admitting: Nurse Practitioner

## 2023-12-13 NOTE — Telephone Encounter (Signed)
 Requested medication (s) are due for refill today: expired medication  Requested medication (s) are on the active medication list: yes  Last refill:  06/20/21 #30 g 4 refills  Future visit scheduled: yes 01/25/24  Notes to clinic:  not delegated per protocol. Do you want to renew Rx?     Requested Prescriptions  Pending Prescriptions Disp Refills   clobetasol  cream (TEMOVATE ) 0.05 % [Pharmacy Med Name: CLOBETASOL  0.05% CREAM] 30 g 0    Sig: Apply 1 application. topically 2 (two) times daily.     Not Delegated - Dermatology:  Corticosteroids Failed - 12/13/2023 12:56 PM      Failed - This refill cannot be delegated      Passed - Valid encounter within last 12 months    Recent Outpatient Visits           2 weeks ago Cellulitis of right anterior lower leg   [place] [facility] [place], Romeo T, NP   1 month ago Cellulitis of right anterior lower leg   [place] [facility] [place], Melanie T, NP   1 month ago Dizziness   [place] [facility] [place], [place] T, NP   1 month ago Primary hypertension   [place] [facility] [place], Melanie T, NP   3 months ago Lower extremity edema   [place] [facility] Her[place] Hadassah SQUIBB, MD

## 2023-12-25 LAB — OPHTHALMOLOGY REPORT-SCANNED

## 2024-01-13 NOTE — Patient Instructions (Signed)
 Be Involved in Caring For Your Health:  Taking Medications When medications are taken as directed, they can greatly improve your health. But if they are not taken as prescribed, they may not work. In some cases, not taking them correctly can be harmful. To help ensure your treatment remains effective and safe, understand your medications and how to take them. Bring your medications to each visit for review by your provider.  Your lab results, notes, and after visit summary will be available on My Chart. We strongly encourage you to use this feature. If lab results are abnormal the clinic will contact you with the appropriate steps. If the clinic does not contact you assume the results are satisfactory. You can always view your results on My Chart. If you have questions regarding your health or results, please contact the clinic during office hours. You can also ask questions on My Chart.  We at [facility] are grateful that you chose [place] to provide your care. We strive to provide evidence-based and compassionate care and are always looking for feedback. If you get a survey from the clinic please complete this so we can hear your opinions.  Heart-Healthy Eating Plan Many factors influence your heart health, including eating and exercise habits. Heart health is also called coronary health. Coronary risk increases with abnormal blood fat (lipid) levels. A heart-healthy eating plan includes limiting unhealthy fats, increasing healthy fats, limiting salt (sodium) intake, and making other diet and lifestyle changes. What is my plan? Your health care provider may recommend that: You limit your fat intake to _________% or less of your total calories each day. You limit your saturated fat intake to _________% or less of your total calories each day. You limit the amount of cholesterol in your diet to less than _________ mg per day. You limit the amount of sodium in your diet to less than _________  mg per day. What are tips for following this plan? Cooking Cook foods using methods other than frying. Baking, boiling, grilling, and broiling are all good options. Other ways to reduce fat include: Removing the skin from poultry. Removing all visible fats from meats. Steaming vegetables in water or broth. Meal planning  At meals, imagine dividing your plate into fourths: Fill one-half of your plate with vegetables and green salads. Fill one-fourth of your plate with whole grains. Fill one-fourth of your plate with lean protein foods. Eat 2-4 cups of vegetables per day. One cup of vegetables equals 1 cup (91 g) broccoli or cauliflower florets, 2 medium carrots, 1 large bell pepper, 1 large sweet potato, 1 large tomato, 1 medium white potato, 2 cups (150 g) raw leafy greens. Eat 1-2 cups of fruit per day. One cup of fruit equals 1 small apple, 1 large banana, 1 cup (237 g) mixed fruit, 1 large orange,  cup (82 g) dried fruit, 1 cup (240 mL) 100% fruit juice. Eat more foods that contain soluble fiber. Examples include apples, broccoli, carrots, beans, peas, and barley. Aim to get 25-30 g of fiber per day. Increase your consumption of legumes, nuts, and seeds to 4-5 servings per week. One serving of dried beans or legumes equals  cup (90 g) cooked, 1 serving of nuts is  oz (12 almonds, 24 pistachios, or 7 walnut halves), and 1 serving of seeds equals  oz (8 g). Fats Choose healthy fats more often. Choose monounsaturated and polyunsaturated fats, such as olive and canola oils, avocado oil, flaxseeds, walnuts, almonds, and seeds. Eat  more omega-3 fats. Choose salmon, mackerel, sardines, tuna, flaxseed oil, and ground flaxseeds. Aim to eat fish at least 2 times each week. Check food labels carefully to identify foods with trans fats or high amounts of saturated fat. Limit saturated fats. These are found in animal products, such as meats, butter, and cream. Plant sources of saturated fats  include palm oil, palm kernel oil, and coconut oil. Avoid foods with partially hydrogenated oils in them. These contain trans fats. Examples are stick margarine, some tub margarines, cookies, crackers, and other baked goods. Avoid fried foods. General information Eat more home-cooked food and less restaurant, buffet, and fast food. Limit or avoid alcohol. Limit foods that are high in added sugar and simple starches such as foods made using white refined flour (white breads, pastries, sweets). Lose weight if you are overweight. Losing just 5-10% of your body weight can help your overall health and prevent diseases such as diabetes and heart disease. Monitor your sodium intake, especially if you have high blood pressure. Talk with your health care provider about your sodium intake. Try to incorporate more vegetarian meals weekly. What foods should I eat? Fruits All fresh, canned (in natural juice), or frozen fruits. Vegetables Fresh or frozen vegetables (raw, steamed, roasted, or grilled). Green salads. Grains Most grains. Choose whole wheat and whole grains most of the time. Rice and pasta, including brown rice and pastas made with whole wheat. Meats and other proteins Lean, well-trimmed beef, veal, pork, and lamb. Chicken and [place] without skin. All fish and shellfish. Wild duck, rabbit, pheasant, and venison. Egg whites or low-cholesterol egg substitutes. Dried beans, peas, lentils, and tofu. Seeds and most nuts. Dairy Low-fat or nonfat cheeses, including ricotta and mozzarella. Skim or 1% milk (liquid, powdered, or evaporated). Buttermilk made with low-fat milk. Nonfat or low-fat yogurt. Fats and oils Non-hydrogenated (trans-free) margarines. Vegetable oils, including soybean, sesame, sunflower, olive, avocado, peanut, safflower, corn, canola, and cottonseed. Salad dressings or mayonnaise made with a vegetable oil. Beverages Water (mineral or sparkling). Coffee and tea. Unsweetened ice  tea. Diet beverages. Sweets and desserts Sherbet, gelatin, and fruit ice. Small amounts of dark chocolate. Limit all sweets and desserts. Seasonings and condiments All seasonings and condiments. The items listed above may not be a complete list of foods and beverages you can eat. Contact a dietitian for more options. What foods should I avoid? Fruits Canned fruit in heavy syrup. Fruit in cream or butter sauce. Fried fruit. Limit coconut. Vegetables Vegetables cooked in cheese, cream, or butter sauce. Fried vegetables. Grains Breads made with saturated or trans fats, oils, or whole milk. Croissants. Sweet rolls. Donuts. High-fat crackers, such as cheese crackers and chips. Meats and other proteins Fatty meats, such as hot dogs, ribs, sausage, bacon, rib-eye roast or steak. High-fat deli meats, such as salami and bologna. Caviar. Domestic duck and goose. Organ meats, such as liver. Dairy Cream, sour cream, cream cheese, and creamed cottage cheese. Whole-milk cheeses. Whole or 2% milk (liquid, evaporated, or condensed). Whole buttermilk. Cream sauce or high-fat cheese sauce. Whole-milk yogurt. Fats and oils Meat fat, or shortening. Cocoa butter, hydrogenated oils, palm oil, coconut oil, palm kernel oil. Solid fats and shortenings, including bacon fat, salt pork, lard, and butter. Nondairy cream substitutes. Salad dressings with cheese or sour cream. Beverages Regular sodas and any drinks with added sugar. Sweets and desserts Frosting. Pudding. Cookies. Cakes. Pies. Milk chocolate or white chocolate. Buttered syrups. Full-fat ice cream or ice cream drinks. The items listed above may  not be a complete list of foods and beverages to avoid. Contact a dietitian for more information. Summary Heart-healthy meal planning includes limiting unhealthy fats, increasing healthy fats, limiting salt (sodium) intake and making other diet and lifestyle changes. Lose weight if you are overweight. Losing just  5-10% of your body weight can help your overall health and prevent diseases such as diabetes and heart disease. Focus on eating a balance of foods, including fruits and vegetables, low-fat or nonfat dairy, lean protein, nuts and legumes, whole grains, and heart-healthy oils and fats. This information is not intended to replace advice given to you by your health care provider. Make sure you discuss any questions you have with your health care provider. Document Revised: 03/07/2021 Document Reviewed: 03/07/2021 Elsevier Patient Education  2024 [employer].

## 2024-01-21 IMAGING — CR DG CHEST 2V
2 series · 2 of 2 positions shown · non-contrast
Comparison: 08/17/2017

CLINICAL DATA: Chest pain

EXAM:
CHEST - 2 VIEW

[chest pa]
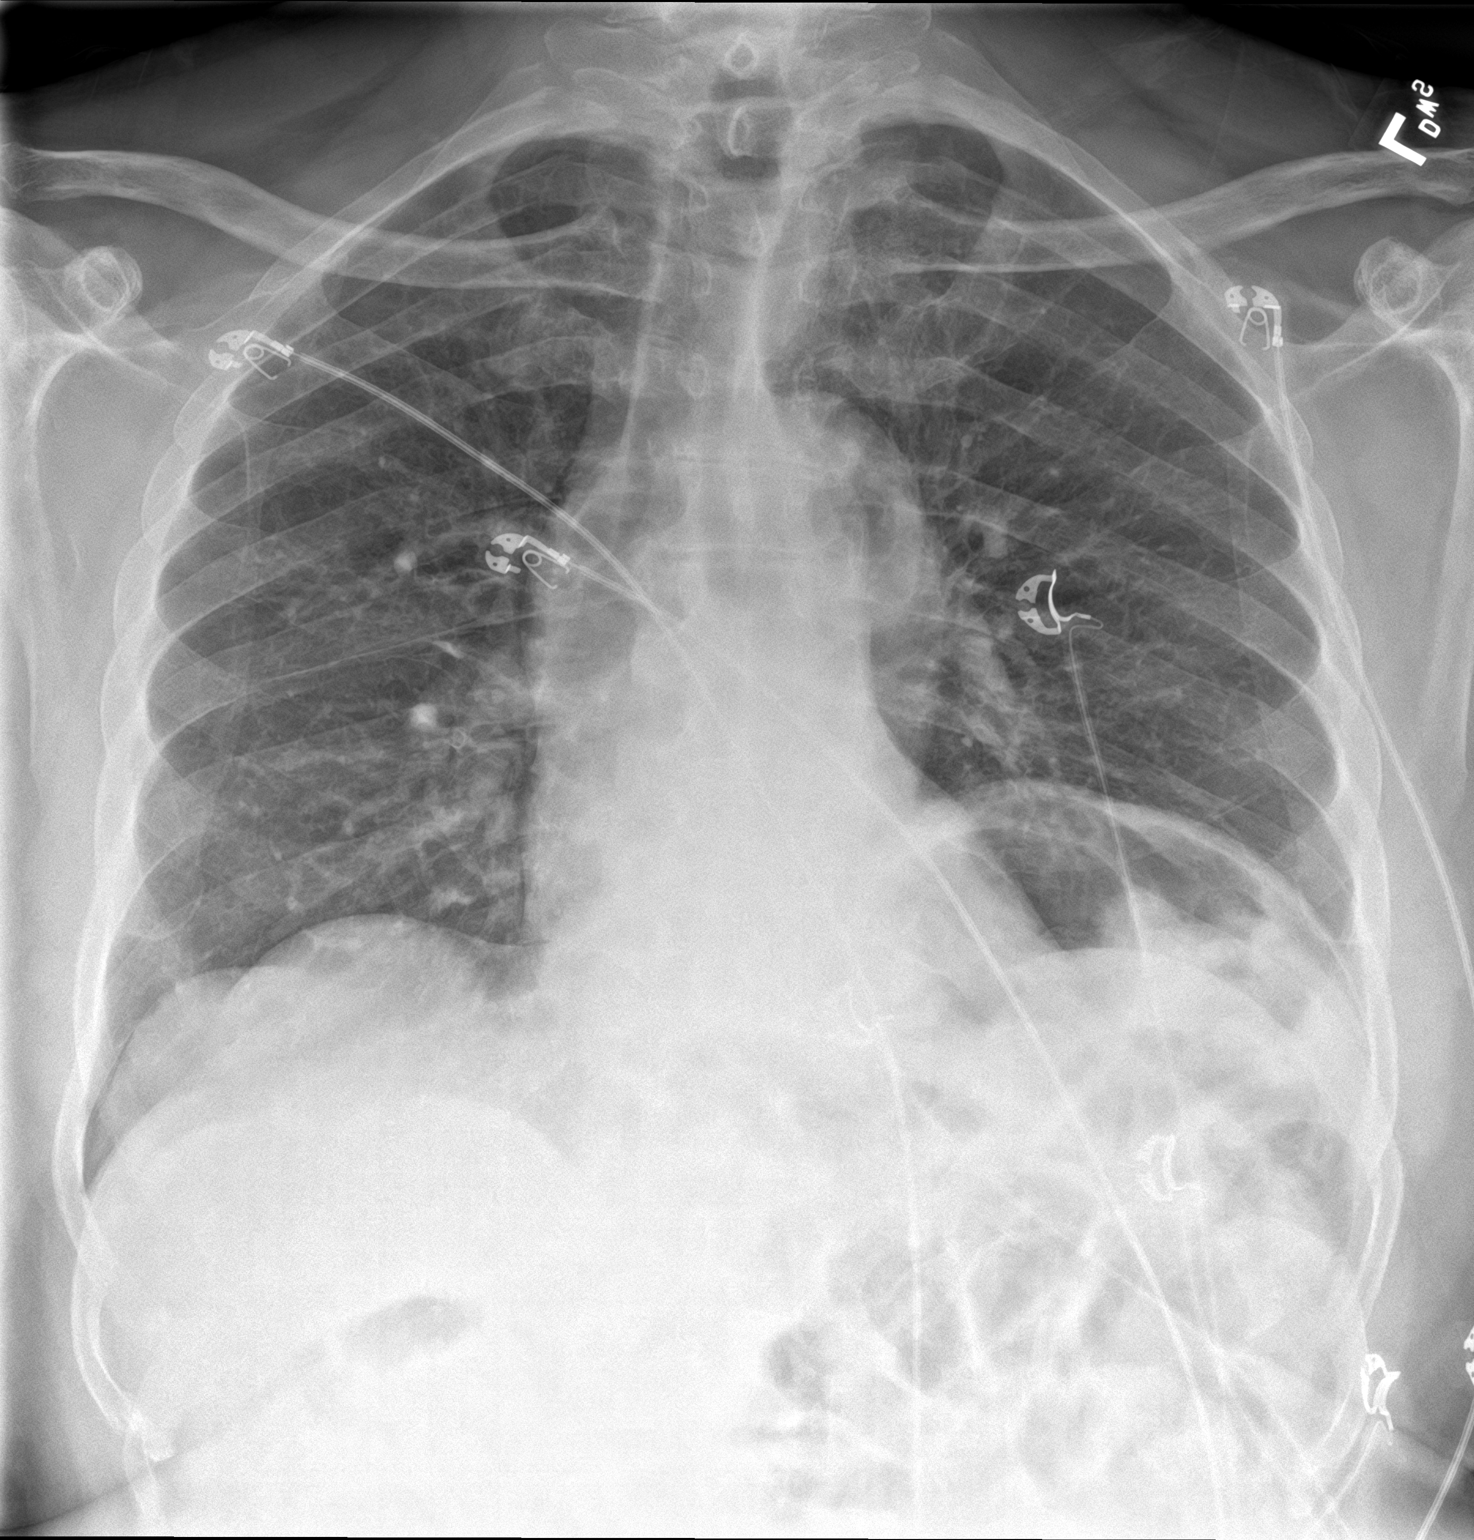

[chest lat]
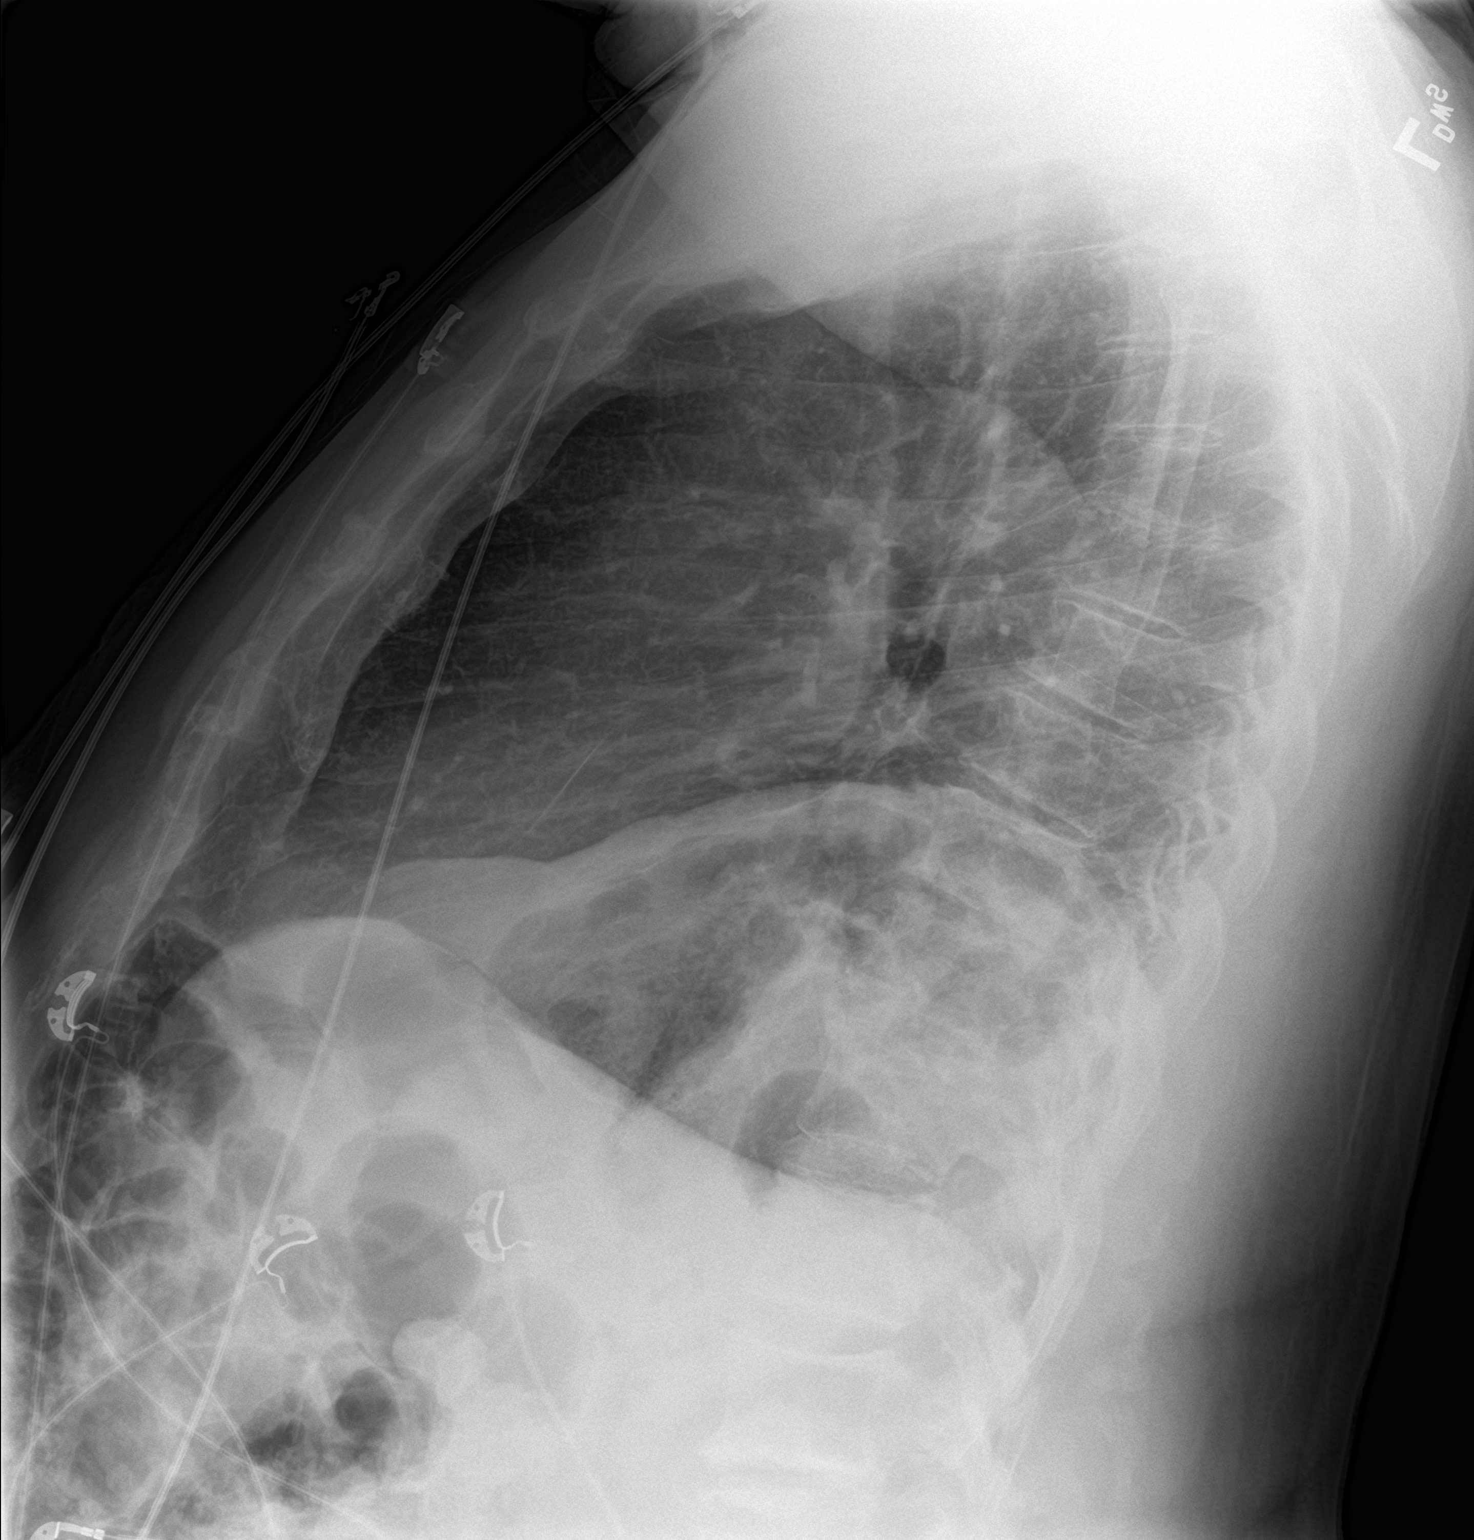

[2 of 2 positions shown; findings below may reference images not displayed]

FINDINGS: Low volume film. The cardiopericardial silhouette is within normal
limits for size. There is pulmonary vascular congestion without
overt pulmonary edema. The lungs are clear without focal pneumonia,
edema, pneumothorax or pleural effusion. Telemetry leads overlie the
chest.
IMPRESSION: Low volume film with pulmonary vascular congestion.

## 2024-01-25 ENCOUNTER — Ambulatory Visit: Admitting: Nurse Practitioner

## 2024-01-25 ENCOUNTER — Encounter: Payer: Self-pay | Admitting: Nurse Practitioner

## 2024-01-25 VITALS — BP 116/68 | HR 73 | Temp 98.1°F | Resp 18 | Ht 77.0 in | Wt 261.4 lb

## 2024-01-25 DIAGNOSIS — Z0000 Encounter for general adult medical examination without abnormal findings: Secondary | ICD-10-CM

## 2024-01-25 DIAGNOSIS — R12 Heartburn: Secondary | ICD-10-CM

## 2024-01-25 DIAGNOSIS — E6609 Other obesity due to excess calories: Secondary | ICD-10-CM

## 2024-01-25 DIAGNOSIS — Z6831 Body mass index (BMI) 31.0-31.9, adult: Secondary | ICD-10-CM | POA: Diagnosis not present

## 2024-01-25 DIAGNOSIS — E782 Mixed hyperlipidemia: Secondary | ICD-10-CM

## 2024-01-25 DIAGNOSIS — I10 Essential (primary) hypertension: Secondary | ICD-10-CM

## 2024-01-25 DIAGNOSIS — R7309 Other abnormal glucose: Secondary | ICD-10-CM

## 2024-01-25 DIAGNOSIS — N400 Enlarged prostate without lower urinary tract symptoms: Secondary | ICD-10-CM | POA: Diagnosis not present

## 2024-01-25 DIAGNOSIS — I739 Peripheral vascular disease, unspecified: Secondary | ICD-10-CM

## 2024-01-25 DIAGNOSIS — I209 Angina pectoris, unspecified: Secondary | ICD-10-CM

## 2024-01-25 DIAGNOSIS — E66811 Obesity, class 1: Secondary | ICD-10-CM

## 2024-01-25 DIAGNOSIS — Z23 Encounter for immunization: Secondary | ICD-10-CM

## 2024-01-25 LAB — BAYER DCA HB A1C WAIVED: HB A1C (BAYER DCA - WAIVED): 5.6 % (ref 4.8–5.6)

## 2024-01-25 NOTE — Assessment & Plan Note (Signed)
 A1c trend down today to 5.9% last check, continue diet focus at home.  Recheck level today. Urine ALB 13 August 2023, could consider addition of ARB if ongoing and if BP can tolerate -- no ACE, previous smoker.  Discussed importance of monitoring diet closely + regular exercise.

## 2024-01-25 NOTE — Assessment & Plan Note (Signed)
BMI 31.00.  Recommended eating smaller high protein, low fat meals more frequently and exercising 30 mins a day 5 times a week with a goal of 10-15lb weight loss in the next 3 months. Patient voiced their understanding and motivation to adhere to these recommendations.

## 2024-01-25 NOTE — Assessment & Plan Note (Signed)
 Chronic, stable without medication at this time.  He requests to continue PSA screening, we have discussed guidelines at length.  He understands these, but wishes to continue screening.

## 2024-01-25 NOTE — Progress Notes (Signed)
 BP 116/68 (BP Location: Left Arm, Patient Position: Sitting, Cuff Size: Normal)   Pulse 73   Temp 98.1 F (36.7 C) (Oral)   Resp 18   Ht 6' 5 (1.956 m)   Wt 261 lb 6.4 oz (118.6 kg)   SpO2 98%   BMI 31.00 kg/m    Subjective:    [name], male    DOB: 11/15/1945, 78 y.o.   MRN: [id number]  HPI: LIOR HOEN is a 78 y.o. male presenting on 01/25/2024 for comprehensive medical examination. Current medical complaints include:none  He currently lives with: self Interim Problems from his last visit: no   HYPERTENSION/HLD Takes Metoprolol  XL, Lasix  PRN, Isosorbide Mono, and Atorvastatin  for HLD. Had left heart catheterization on 07/18/21 due to angina pain -- taking ASA and Imdur. No more chest pain since that time.  Aortic atherosclerosis noted on CT imaging 08/17/17. Dr. Florencio last seen on 09/14/23 with no changes. Echo on 07/18/21 LVEF 45-50%. Hypertension status: stable  Satisfied with current treatment? yes Duration of hypertension: chronic BP monitoring frequency:  occasionally BP range: <130/80 on average BP medication side effects:  no Medication compliance: good compliance Aspirin : no Recurrent headaches: no Visual changes: no Palpitations: no Dyspnea: no Chest pain: no Lower extremity edema: yes, at baseline -- takes Lasix  which helps Dizzy/lightheaded: no  The ASCVD Risk score (Arnett DK, et al., 2019) failed to calculate for the following reasons:   Risk score cannot be calculated because patient has a medical history suggesting prior/existing ASCVD   * - Cholesterol units were assumed  Impaired Fasting Glucose  No current medications.     HbA1C:  Lab Results  Component Value Date   HGBA1C 5.9 (H) 07/20/2023  Duration of elevated blood sugar: years Polydipsia: no Polyuria: no Weight change: no Visual disturbance: no Glucose Monitoring: no    Accucheck frequency: Not Checking    Fasting glucose:     Post prandial:  Diabetic Education: Not  Completed Family history of diabetes: yes -- cousins  GERD Takes Omeprazole  daily. GERD control status: controlled Satisfied with current treatment? yes Heartburn frequency:  Medication side effects: no  Medication compliance: stable Dysphagia: no Odynophagia:  no Hematemesis: no Blood in stool: no EGD: yes   Functional Status Survey: Is the patient deaf or have difficulty hearing?: No Does the patient have difficulty seeing, even when wearing glasses/contacts?: No Does the patient have difficulty concentrating, remembering, or making decisions?: No Does the patient have difficulty walking or climbing stairs?: No Does the patient have difficulty dressing or bathing?: No Does the patient have difficulty doing errands alone such as visiting a doctor's office or shopping?: No  FALL RISK:    01/25/2024    8:11 AM 11/23/2023    9:59 AM 11/01/2023    9:58 AM 10/25/2023   10:15 AM 07/20/2023   10:01 AM  Fall Risk   Falls in the past year? 0 0 0 0 0  Number falls in past yr: 0 0 0 0 0  Injury with Fall? 0 0  0  0  0   Risk for fall due to : No Fall Risks No Fall Risks No Fall Risks No Fall Risks No Fall Risks  Follow up Falls evaluation completed Falls evaluation completed Falls evaluation completed Falls evaluation completed Falls evaluation completed     Data saved with a previous flowsheet row definition   Depression Screen    01/25/2024    8:11 AM 11/23/2023  9:59 AM 11/01/2023    9:58 AM 10/25/2023   10:15 AM 07/20/2023   10:01 AM  Depression screen PHQ 2/9  Decreased Interest 0 0 0 0 0  Down, Depressed, Hopeless 0 0 0 0 0  PHQ - 2 Score 0 0 0 0 0  Altered sleeping 0 0 0 0 0  Tired, decreased energy 0 0 0 0 0  Change in appetite 0 0 0 0 0  Feeling bad or failure about yourself  0 0 0 0 0  Trouble concentrating 0 0 0 0 0  Moving slowly or fidgety/restless 0 0 0 0 0  Suicidal thoughts 0 0 0 0 0  PHQ-9 Score 0 0  0  0  0   Difficult doing work/chores Not difficult at  all    Not difficult at all     Data saved with a previous flowsheet row definition      01/25/2024    8:11 AM 11/23/2023    9:59 AM 11/01/2023    9:58 AM 10/25/2023   10:16 AM  GAD 7 : Generalized Anxiety Score  Nervous, Anxious, on Edge 0 0 0 0  Control/stop worrying 0 0 0 0  Worry too much - different things 0 0 0 0  Trouble relaxing 0 0 0 0  Restless 0 0 0 0  Easily annoyed or irritable 0 0 0 0  Afraid - awful might happen 0 0 0 0  Total GAD 7 Score 0 0 0 0  Anxiety Difficulty Not difficult at all      Past Medical History:  Past Medical History:  Diagnosis Date   Arthritis    GERD (gastroesophageal reflux disease)    RARE-NO MEDS   Hyperlipidemia    Hypertension    Myocardial infarction (HCC)    Pre-diabetes    Sleep apnea    MILD-NO CPAP-COULD NOT TOELERATE   Status post surgical removal of nail matrix of toe of left foot    Status post surgical removal of nail matrix of toe of right foot     Surgical History:  Past Surgical History:  Procedure Laterality Date   COLONOSCOPY  2012   COLONOSCOPY WITH PROPOFOL  N/A 07/20/2020   Procedure: COLONOSCOPY WITH PROPOFOL ;  Surgeon: Janalyn Keene NOVAK, MD;  Location: [facility] ENDOSCOPY;  Service: Endoscopy;  Laterality: N/A;   COLONOSCOPY WITH PROPOFOL  N/A 12/30/2021   Procedure: COLONOSCOPY WITH PROPOFOL ;  Surgeon: Therisa Bi, MD;  Location: [facility] ENDOSCOPY;  Service: Gastroenterology;  Laterality: N/A;   COLONOSCOPY WITH PROPOFOL  N/A 12/07/2022   Procedure: COLONOSCOPY WITH PROPOFOL ;  Surgeon: Therisa Bi, MD;  Location: [facility] ENDOSCOPY;  Service: Gastroenterology;  Laterality: N/A;   hair folicle removed     HERNIA REPAIR     INGUINAL HERNIA REPAIR Bilateral 09/29/2015   Procedure: LAPAROSCOPIC BILATERAL INGUINAL HERNIA REPAIR;  Surgeon: Reyes LELON Cota, MD;  Location: [facility] ORS;  Service: General;  Laterality: Bilateral;   LEFT HEART CATH AND CORONARY ANGIOGRAPHY Left 07/18/2021   Procedure: LEFT HEART CATH AND CORONARY  ANGIOGRAPHY;  Surgeon: Florencio Cara BIRCH, MD;  Location: [facility] INVASIVE CV LAB;  Service: Cardiovascular;  Laterality: Left;   SKIN GRAFT Right 1966   hand    Medications:  Current Outpatient Medications on File Prior to Visit  Medication Sig   Aspirin -Acetaminophen -Caffeine (BC FAST PAIN RELIEF MAX STR) 500-500-65 MG PACK Take 1 packet by mouth daily as needed (pain.).   atorvastatin  (LIPITOR) 40 MG tablet Take 1 tablet (40 mg total) by mouth  daily.   furosemide  (LASIX ) 20 MG tablet Take 1 tablet (20 mg total) by mouth daily as needed.   isosorbide mononitrate (IMDUR) 30 MG 24 hr tablet Take 30 mg by mouth daily.   ketoconazole (NIZORAL) 2 % cream Apply 1 Application topically 2 (two) times daily. prn   lidocaine  (LIDODERM ) 5 % Place 1 patch onto the skin daily. Remove & Discard patch within 12 hours or as directed by MD   meloxicam  (MOBIC ) 15 MG tablet Take 15 mg by mouth daily. prn   metoprolol  succinate (TOPROL -XL) 25 MG 24 hr tablet TAKE ONE TABLET DAILY BY MOUTH.   Multiple Vitamin (MULTIVITAMIN WITH MINERALS) TABS tablet Take 1 tablet by mouth 2 (two) times a week. One-A-Day   mupirocin  ointment (BACTROBAN ) 2 % Apply 1 Application topically 2 (two) times daily.   omeprazole  (PRILOSEC) 40 MG capsule Take 1 capsule (40 mg total) by mouth in the morning.   QC LO-DOSE ASPIRIN  81 MG tablet Take 81 mg by mouth daily.   Blood Glucose Monitoring Suppl (ACCU-CHEK AVIVA PLUS) w/Device KIT 1 each by Does not apply route 2 (two) times daily. (Patient not taking: Reported on 01/25/2024)   glucose blood (ACCU-CHEK AVIVA PLUS) test strip 1 each by Other route 2 (two) times daily. (Patient not taking: Reported on 01/25/2024)   No current facility-administered medications on file prior to visit.    Allergies:  No Known Allergies  Social History:  Social History   Socioeconomic History   Marital status: Widowed    Spouse name: Not on file   Number of children: 1   Years of education: 73    Highest education level: High school graduate  Occupational History   Not on file  Tobacco Use   Smoking status: Former    Current packs/day: 0.00    Average packs/day: 0.3 packs/day for 15.0 years (3.8 ttl pk-yrs)    Types: Cigarettes    Start date: 09/13/1965    Quit date: 09/13/1980    Years since quitting: 43.3   Smokeless tobacco: Never  Vaping Use   Vaping status: Never Used  Substance and Sexual Activity   Alcohol use: Yes    Alcohol/week: 1.0 - 2.0 standard drink of alcohol    Types: 1 - 2 Standard drinks or equivalent per week    Comment: 1 drink 1-2 times per week   Drug use: No   Sexual activity: Not on file  Other Topics Concern   Not on file  Social History Narrative   Wife passed away 05-29-22   Social Drivers of Health   Tobacco Use: Medium Risk (01/25/2024)   Patient History    Smoking Tobacco Use: Former    Smokeless Tobacco Use: Never    Passive Exposure: Not on Actuary Strain: Low Risk  (08/16/2023)   Received from [facility]   Overall Financial Resource Strain (CARDIA)    Difficulty of Paying Living Expenses: Not hard at all  Food Insecurity: No Food Insecurity (08/16/2023)   Received from [facility]   Epic    Within the past 12 months, you worried that your food would run out before you got the money to buy more.: Never true    Within the past 12 months, the food you bought just didn't last and you didn't have money to get more.: Never true  Transportation Needs: No Transportation Needs (08/16/2023)   Received from [facility]   [facility] - Transportation  In the past 12 months, has lack of transportation kept you from medical appointments or from getting medications?: No    Lack of Transportation (Non-Medical): No  Physical Activity: Insufficiently Active (03/29/2023)   Exercise Vital Sign    Days of Exercise per Week: 2 days    Minutes of Exercise per Session: 20 min  Stress: No  Stress Concern Present (03/29/2023)   [employer] - Occupational Stress Questionnaire    Feeling of Stress : Not at all  Social Connections: Moderately Isolated (03/29/2023)   Social Connection and Isolation Panel    Frequency of Communication with Friends and Family: Three times a week    Frequency of Social Gatherings with Friends and Family: More than three times a week    Attends Religious Services: More than 4 times per year    Active Member of Clubs or Organizations: No    Attends Banker Meetings: Never    Marital Status: Widowed  Intimate Partner Violence: Not At Risk (03/29/2023)   Humiliation, Afraid, Rape, and Kick questionnaire    Fear of Current or Ex-Partner: No    Emotionally Abused: No    Physically Abused: No    Sexually Abused: No  Depression (PHQ2-9): Low Risk (01/25/2024)   Depression (PHQ2-9)    PHQ-2 Score: 0  Alcohol Screen: Low Risk (03/29/2023)   Alcohol Screen    Last Alcohol Screening Score (AUDIT): 2  Housing: Low Risk  (09/14/2023)   Received from [facility]   Epic    In the last 12 months, was there a time when you were not able to pay the mortgage or rent on time?: No    In the past 12 months, how many times have you moved where you were living?: 0    At any time in the past 12 months, were you homeless or living in a shelter (including now)?: No  Utilities: Not At Risk (08/16/2023)   Received from [facility]   Epic    In the past 12 months has the electric, gas, oil, or water company threatened to shut off services in your home?: No  Health Literacy: Adequate Health Literacy (03/29/2023)   B1300 Health Literacy    Frequency of need for help with medical instructions: Never   Social History   Tobacco Use  Smoking Status Former   Current packs/day: 0.00   Average packs/day: 0.3 packs/day for 15.0 years (3.8 ttl pk-yrs)   Types: Cigarettes   Start date: 09/13/1965   Quit  date: 09/13/1980   Years since quitting: 43.3  Smokeless Tobacco Never   Social History   Substance and Sexual Activity  Alcohol Use Yes   Alcohol/week: 1.0 - 2.0 standard drink of alcohol   Types: 1 - 2 Standard drinks or equivalent per week   Comment: 1 drink 1-2 times per week    Family History:  Family History  Problem Relation Age of Onset   Ovarian cancer Mother    Heart disease Father    Lymphoma Maternal Uncle     Past medical history, surgical history, medications, allergies, family history and social history reviewed with patient today and changes made to appropriate areas of the chart.   Review of Systems - negative All other ROS negative except what is listed above and in the HPI.      Objective:    BP 116/68 (BP Location: Left Arm, Patient Position: Sitting, Cuff Size: Normal)   Pulse 73  Temp 98.1 F (36.7 C) (Oral)   Resp 18   Ht 6' 5 (1.956 m)   Wt 261 lb 6.4 oz (118.6 kg)   SpO2 98%   BMI 31.00 kg/m   Wt Readings from Last 3 Encounters:  01/25/24 261 lb 6.4 oz (118.6 kg)  11/23/23 258 lb (117 kg)  11/01/23 260 lb (117.9 kg)    Physical Exam Vitals and nursing note reviewed.  Constitutional:      General: He is awake. He is not in acute distress.    Appearance: He is well-developed and well-groomed. He is obese. He is not ill-appearing or toxic-appearing.  HENT:     Head: Normocephalic and atraumatic.     Right Ear: Hearing, tympanic membrane, ear canal and external ear normal. No drainage.     Left Ear: Hearing, tympanic membrane, ear canal and external ear normal. No drainage.     Nose: Nose normal.     Mouth/Throat:     Pharynx: Uvula midline.  Eyes:     General: Lids are normal.        Right eye: No discharge.        Left eye: No discharge.     Extraocular Movements: Extraocular movements intact.     Conjunctiva/sclera: Conjunctivae normal.     Pupils: Pupils are equal, round, and reactive to light.     Visual Fields: Right eye visual  fields normal and left eye visual fields normal.  Neck:     Thyroid : No thyromegaly.     Vascular: No carotid bruit or JVD.     Trachea: Trachea normal.  Cardiovascular:     Rate and Rhythm: Normal rate and regular rhythm.     Heart sounds: S1 normal and S2 normal. Murmur heard.     Systolic murmur is present with a grade of 2/6.     No gallop.     Comments: Reduced hair pattern bilateral legs and dry skin. Pulmonary:     Effort: Pulmonary effort is normal. No accessory muscle usage or respiratory distress.     Breath sounds: Normal breath sounds.  Abdominal:     General: Bowel sounds are normal.     Palpations: Abdomen is soft. There is no hepatomegaly or splenomegaly.     Tenderness: There is no abdominal tenderness.  Musculoskeletal:        General: Normal range of motion.     Cervical back: Normal range of motion and neck supple.     Right lower leg: 1+ Edema present.     Left lower leg: 1+ Edema present.  Lymphadenopathy:     Head:     Right side of head: No submental, submandibular, tonsillar, preauricular or posterior auricular adenopathy.     Left side of head: No submental, submandibular, tonsillar, preauricular or posterior auricular adenopathy.     Cervical: No cervical adenopathy.  Skin:    General: Skin is warm and dry.     Capillary Refill: Capillary refill takes less than 2 seconds.     Findings: No rash.  Neurological:     Mental Status: He is alert and oriented to person, place, and time.     Gait: Gait is intact.     Deep Tendon Reflexes: Reflexes are normal and symmetric.     Reflex Scores:      Brachioradialis reflexes are 2+ on the right side and 2+ on the left side.      Patellar reflexes are 2+ on the right side and  2+ on the left side. Psychiatric:        Attention and Perception: Attention normal.        Mood and Affect: Mood normal.        Speech: Speech normal.        Behavior: Behavior normal. Behavior is cooperative.        Thought Content:  Thought content normal.        Cognition and Memory: Cognition normal.       03/29/2023    8:16 AM 03/20/2022    8:30 AM 12/22/2021    9:21 AM 12/20/2020    8:42 AM 01/26/2020    2:33 PM  6CIT Screen  What Year? 0 points 0 points 0 points 0 points 0 points  What month? 0 points 0 points 0 points 0 points 0 points  What time? 0 points 0 points 0 points 0 points 0 points  Count back from 20 0 points 0 points 0 points 0 points 0 points  Months in reverse 0 points 0 points 0 points 0 points 0 points  Repeat phrase 2 points 2 points 0 points 0 points 0 points  Total Score 2 points 2 points 0 points 0 points 0 points   Results for orders placed or performed in visit on 12/25/23  OPHTHALMOLOGY REPORT-SCANNED   Collection Time: 12/25/23  3:28 PM  Result Value Ref Range   HM Diabetic Eye Exam No Retinopathy No Retinopathy   A Comment        Assessment & Plan:   Problem List Items Addressed This Visit       Cardiovascular and Mediastinum   HTN (hypertension) - Primary (Chronic)   Chronic, ongoing.  BP at goal for age today.  Continue current medication regimen + collaboration with cardiology and recommend checking BP daily at home and documenting + focus on DASH diet.  Labs today: CBC, CMP, TSH.  Urine ALB 13 August 2023, could consider adding ARB (no ACE, previous smoker) in future for BP and proteinuria discussed with him.  Wear compression hose during daytime hours.      Relevant Orders   CBC with Differential/Platelet   Comprehensive metabolic panel with GFR   TSH   Angina pectoris (Chronic)   Chronic, stable.  Diagnosed 07/18/21 with left heart cath performed at the time.  Continue collaboration with cardiology.  No further CP reported.  Will continue medications as ordered by cardiology.      PVD (peripheral vascular disease)   Ongoing, noted on exam with decreased hair pattern, diminished pulses (1+), dry skin, edema.  Recommend use of compression hose daily, on during day and off  at night.  Monitor skin for breakdown.  Apply lotion daily -- may need steroid cream in future.  Does not wish to go to vascular at present, but would benefit this in future.      Relevant Orders   Comprehensive metabolic panel with GFR   Lipid Panel w/o Chol/HDL Ratio     Genitourinary   BPH without obstruction/lower urinary tract symptoms   Chronic, stable without medication at this time.  He requests to continue PSA screening, we have discussed guidelines at length.  He understands these, but wishes to continue screening.      Relevant Orders   PSA     Other   Hyperlipidemia (Chronic)   Chronic, ongoing.  Continue daily statin and adjust dose as needed. Continue daily ASA 81 MG for prevention.  Return in 6 months.  Lipid  panel today.      Relevant Orders   Comprehensive metabolic panel with GFR   Lipid Panel w/o Chol/HDL Ratio   Heart burn (Chronic)   Chronic, ongoing, stable with Prilosec. Continue current medication regimen and adjust as needed. Mag level annually. Risks of PPI use were discussed with patient including bone loss, C. Diff diarrhea, pneumonia, infections, CKD, electrolyte abnormalities.  Verbalizes understanding and chooses to continue the medication.        Relevant Orders   Magnesium   Elevated hemoglobin A1c (Chronic)   A1c trend down today to 5.9% last check, continue diet focus at home.  Recheck level today. Urine ALB 13 August 2023, could consider addition of ARB if ongoing and if BP can tolerate -- no ACE, previous smoker.  Discussed importance of monitoring diet closely + regular exercise.      Relevant Orders   Bayer DCA Hb A1c Waived   Obesity   BMI 31.00. Recommended eating smaller high protein, low fat meals more frequently and exercising 30 mins a day 5 times a week with a goal of 10-15lb weight loss in the next 3 months. Patient voiced their understanding and motivation to adhere to these recommendations.        Other Visit Diagnoses        COVID-19 vaccine administered       Covid vaccine provided today, educated on this.   Relevant Orders   Pfizer Comirnaty Covid-19 Vaccine 72yrs & older (Completed)     Encounter for annual physical exam       Annual physical today with labs and health maintenance reviewed, discussed with patient.        Discussed aspirin  prophylaxis for myocardial infarction prevention and decision was made to continue ASA  LABORATORY TESTING:  Health maintenance labs ordered today as discussed above.   The natural history of prostate cancer and ongoing controversy regarding screening and potential treatment outcomes of prostate cancer has been discussed with the patient. The meaning of a false positive PSA and a false negative PSA has been discussed. He indicates understanding of the limitations of this screening test and wishes to not proceed with screening PSA testing due to age and guidelines.  IMMUNIZATIONS:   - Tdap: Tetanus vaccination status reviewed: last tetanus booster within 10 years. - Influenza: Up to date - Pneumovax: Up to date - Prevnar: Up to date - Zostavax vaccine: Up To Date - Covid: has had a few of these, would like one today  SCREENING: - Colonoscopy: Up to date -- no further testing Discussed with patient purpose of the colonoscopy is to detect colon cancer at curable precancerous or early stages   - AAA Screening: Had done in past per report -Hearing Test: Not applicable  -Spirometry: Not applicable   PATIENT COUNSELING:    Sexuality: Discussed sexually transmitted diseases, partner selection, use of condoms, avoidance of unintended pregnancy  and contraceptive alternatives.   Advised to avoid cigarette smoking.  I discussed with the patient that most people either abstain from alcohol or drink within safe limits (<=14/week and <=4 drinks/occasion for males, <=7/weeks and <= 3 drinks/occasion for females) and that the risk for alcohol disorders and other health effects  rises proportionally with the number of drinks per week and how often a drinker exceeds daily limits.  Discussed cessation/primary prevention of drug use and availability of treatment for abuse.   Diet: Encouraged to adjust caloric intake to maintain  or achieve ideal body weight, to reduce intake  of dietary saturated fat and total fat, to limit sodium intake by avoiding high sodium foods and not adding table salt, and to maintain adequate dietary potassium and calcium  preferably from fresh fruits, vegetables, and low-fat dairy products.    Stressed the importance of regular exercise  Injury prevention: Discussed safety belts, safety helmets, smoke detector, smoking near bedding or upholstery.   Dental health: Discussed importance of regular tooth brushing, flossing, and dental visits.   Follow up plan: NEXT PREVENTATIVE PHYSICAL DUE IN 1 YEAR. Return in about 6 months (around 07/25/2024) for HTN/HLD, IFG.

## 2024-01-25 NOTE — Assessment & Plan Note (Addendum)
 Chronic, ongoing.  BP at goal for age today.  Continue current medication regimen + collaboration with cardiology and recommend checking BP daily at home and documenting + focus on DASH diet.  Labs today: CBC, CMP, TSH.  Urine ALB 13 August 2023, could consider adding ARB (no ACE, previous smoker) in future for BP and proteinuria discussed with him.  Wear compression hose during daytime hours.

## 2024-01-25 NOTE — Assessment & Plan Note (Signed)
 Chronic, ongoing.  Continue daily statin and adjust dose as needed. Continue daily ASA 81 MG for prevention.  Return in 6 months.  Lipid panel today.

## 2024-01-25 NOTE — Assessment & Plan Note (Signed)
 Chronic, ongoing, stable with Prilosec.  Continue current medication regimen and adjust as needed. Mag level annually. Risks of PPI use were discussed with patient including bone loss, C. Diff diarrhea, pneumonia, infections, CKD, electrolyte abnormalities.  Verbalizes understanding and chooses to continue the medication.

## 2024-01-25 NOTE — Assessment & Plan Note (Signed)
 Ongoing, noted on exam with decreased hair pattern, diminished pulses (1+), dry skin, edema.  Recommend use of compression hose daily, on during day and off at night.  Monitor skin for breakdown.  Apply lotion daily -- may need steroid cream in future.  Does not wish to go to vascular at present, but would benefit this in future.

## 2024-01-25 NOTE — Assessment & Plan Note (Signed)
 Chronic, stable.  Diagnosed 07/18/21 with left heart cath performed at the time.  Continue collaboration with cardiology.  No further CP reported.  Will continue medications as ordered by cardiology.

## 2024-01-26 ENCOUNTER — Ambulatory Visit: Payer: Self-pay | Admitting: Nurse Practitioner

## 2024-01-26 LAB — COMPREHENSIVE METABOLIC PANEL WITH GFR
ALT: 11 IU/L (ref 0–44)
AST: 19 IU/L (ref 0–40)
Albumin: 4 g/dL (ref 3.8–4.8)
Alkaline Phosphatase: 73 IU/L (ref 47–123)
BUN/Creatinine Ratio: 15 (ref 10–24)
BUN: 15 mg/dL (ref 8–27)
Bilirubin Total: 0.7 mg/dL (ref 0.0–1.2)
CO2: 26 mmol/L (ref 20–29)
Calcium: 9.3 mg/dL (ref 8.6–10.2)
Chloride: 104 mmol/L (ref 96–106)
Creatinine, Ser: 1.02 mg/dL (ref 0.76–1.27)
Globulin, Total: 2 g/dL (ref 1.5–4.5)
Glucose: 97 mg/dL (ref 70–99)
Potassium: 4.1 mmol/L (ref 3.5–5.2)
Sodium: 141 mmol/L (ref 134–144)
Total Protein: 6 g/dL (ref 6.0–8.5)
eGFR: 76 mL/min/1.73 (ref >59)

## 2024-01-26 LAB — CBC WITH DIFFERENTIAL/PLATELET
Basophils Absolute: 0 x10E3/uL (ref 0.0–0.2)
Basos: 1 %
EOS (ABSOLUTE): 0.3 x10E3/uL (ref 0.0–0.4)
Eos: 7 %
Hematocrit: 42.9 % (ref 37.5–51.0)
Hemoglobin: 13.8 g/dL (ref 13.0–17.7)
Immature Grans (Abs): 0 x10E3/uL (ref 0.0–0.1)
Immature Granulocytes: 0 %
Lymphocytes Absolute: 1.3 x10E3/uL (ref 0.7–3.1)
Lymphs: 34 %
MCH: 32.2 pg (ref 26.6–33.0)
MCHC: 32.2 g/dL (ref 31.5–35.7)
MCV: 100 fL — ABNORMAL HIGH (ref 79–97)
Monocytes Absolute: 0.5 x10E3/uL (ref 0.1–0.9)
Monocytes: 13 %
Neutrophils Absolute: 1.7 x10E3/uL (ref 1.4–7.0)
Neutrophils: 45 %
Platelets: 150 x10E3/uL (ref 150–450)
RBC: 4.29 x10E6/uL (ref 4.14–5.80)
RDW: 12.7 % (ref 11.6–15.4)
WBC: 3.9 x10E3/uL (ref 3.4–10.8)

## 2024-01-26 LAB — LIPID PANEL W/O CHOL/HDL RATIO
Cholesterol, Total: 140 mg/dL (ref 100–199)
HDL: 64 mg/dL (ref >39)
LDL Chol Calc (NIH): 63 mg/dL (ref 0–99)
Triglycerides: 61 mg/dL (ref 0–149)
VLDL Cholesterol Cal: 13 mg/dL (ref 5–40)

## 2024-01-26 LAB — PSA: Prostate Specific Ag, Serum: 5.5 ng/mL — ABNORMAL HIGH (ref 0.0–4.0)

## 2024-01-26 LAB — MAGNESIUM: Magnesium: 2 mg/dL (ref 1.6–2.3)

## 2024-01-26 LAB — TSH: TSH: 1.51 u[IU]/mL (ref 0.450–4.500)

## 2024-01-26 NOTE — Progress Notes (Signed)
 Good morning please let Derek Wright know his labs have returned: - Blood counts are normal. - Kidney and liver function are normal. - Lipid panel shows stable levels, continue medication as ordered. Thyroid  level normal. - PSA, prostate blood work, has trended up some. It is still within normal range for your age and ethnicity, but has trended up. I would like to recheck this at next visit and also perform manual prostate exam. If this continues to trend up I may get you into urology. Please ensure no intercourse at least 48 hours prior to next visit. Any questions? Keep being amazing!!  Thank you for allowing me to participate in your care.  I appreciate you. Kindest regards, Oleda Borski

## 2024-07-28 ENCOUNTER — Ambulatory Visit: Admitting: Nurse Practitioner
